# Patient Record
Sex: Male | Born: 1935 | Race: Black or African American | Hispanic: No | Marital: Married | Smoking: Former smoker
Health system: Southern US, Community
[De-identification: ages and names within clinical notes are randomized; demographics above are authoritative.]

## PROBLEM LIST (undated history)

## (undated) DIAGNOSIS — K2100 Gastro-esophageal reflux disease with esophagitis, without bleeding: Secondary | ICD-10-CM

## (undated) DIAGNOSIS — D120 Benign neoplasm of cecum: Secondary | ICD-10-CM

## (undated) DIAGNOSIS — D123 Benign neoplasm of transverse colon: Secondary | ICD-10-CM

## (undated) DIAGNOSIS — M25522 Pain in left elbow: Secondary | ICD-10-CM

## (undated) DIAGNOSIS — M533 Sacrococcygeal disorders, not elsewhere classified: Secondary | ICD-10-CM

## (undated) DIAGNOSIS — M48061 Spinal stenosis, lumbar region without neurogenic claudication: Secondary | ICD-10-CM

## (undated) DIAGNOSIS — E860 Dehydration: Secondary | ICD-10-CM

## (undated) DIAGNOSIS — N529 Male erectile dysfunction, unspecified: Secondary | ICD-10-CM

## (undated) DIAGNOSIS — I639 Cerebral infarction, unspecified: Secondary | ICD-10-CM

## (undated) DIAGNOSIS — E559 Vitamin D deficiency, unspecified: Secondary | ICD-10-CM

## (undated) DIAGNOSIS — I499 Cardiac arrhythmia, unspecified: Secondary | ICD-10-CM

## (undated) DIAGNOSIS — R64 Cachexia: Secondary | ICD-10-CM

## (undated) DIAGNOSIS — K2970 Gastritis, unspecified, without bleeding: Secondary | ICD-10-CM

## (undated) DIAGNOSIS — E785 Hyperlipidemia, unspecified: Secondary | ICD-10-CM

## (undated) DIAGNOSIS — I719 Aortic aneurysm of unspecified site, without rupture: Secondary | ICD-10-CM

## (undated) DIAGNOSIS — I351 Nonrheumatic aortic (valve) insufficiency: Secondary | ICD-10-CM

## (undated) DIAGNOSIS — M47816 Spondylosis without myelopathy or radiculopathy, lumbar region: Secondary | ICD-10-CM

## (undated) DIAGNOSIS — R531 Weakness: Secondary | ICD-10-CM

## (undated) DIAGNOSIS — I10 Essential (primary) hypertension: Secondary | ICD-10-CM

## (undated) DIAGNOSIS — I712 Thoracic aortic aneurysm, without rupture: Secondary | ICD-10-CM

## (undated) DIAGNOSIS — I4891 Unspecified atrial fibrillation: Secondary | ICD-10-CM

## (undated) DIAGNOSIS — I5022 Chronic systolic (congestive) heart failure: Secondary | ICD-10-CM

## (undated) DIAGNOSIS — R0609 Other forms of dyspnea: Secondary | ICD-10-CM

## (undated) DIAGNOSIS — L8990 Pressure ulcer of unspecified site, unspecified stage: Secondary | ICD-10-CM

## (undated) DIAGNOSIS — N289 Disorder of kidney and ureter, unspecified: Secondary | ICD-10-CM

## (undated) DIAGNOSIS — D649 Anemia, unspecified: Secondary | ICD-10-CM

## (undated) DIAGNOSIS — R29898 Other symptoms and signs involving the musculoskeletal system: Secondary | ICD-10-CM

## (undated) DIAGNOSIS — I69322 Dysarthria following cerebral infarction: Secondary | ICD-10-CM

## (undated) DIAGNOSIS — J449 Chronic obstructive pulmonary disease, unspecified: Secondary | ICD-10-CM

## (undated) DIAGNOSIS — J189 Pneumonia, unspecified organism: Secondary | ICD-10-CM

## (undated) DIAGNOSIS — I4819 Other persistent atrial fibrillation: Secondary | ICD-10-CM

## (undated) DIAGNOSIS — K921 Melena: Secondary | ICD-10-CM

## (undated) DIAGNOSIS — J431 Panlobular emphysema: Secondary | ICD-10-CM

## (undated) DIAGNOSIS — K621 Rectal polyp: Secondary | ICD-10-CM

## (undated) DIAGNOSIS — C3492 Malignant neoplasm of unspecified part of left bronchus or lung: Secondary | ICD-10-CM

## (undated) HISTORY — DX: Gastritis, unspecified, without bleeding: K29.70

## (undated) HISTORY — DX: Weakness: R53.1

## (undated) HISTORY — DX: Other persistent atrial fibrillation: I48.19

## (undated) HISTORY — DX: Spondylosis without myelopathy or radiculopathy, lumbar region: M47.816

## (undated) HISTORY — DX: Disorder of kidney and ureter, unspecified: N28.9

## (undated) HISTORY — DX: Panlobular emphysema: J43.1

## (undated) HISTORY — DX: Benign neoplasm of cecum: D12.0

## (undated) HISTORY — DX: Chronic systolic (congestive) heart failure: I50.22

## (undated) HISTORY — PX: FACIAL FRACTURE SURGERY: SHX1570

## (undated) HISTORY — DX: Essential (primary) hypertension: I10

## (undated) HISTORY — DX: Hyperlipidemia, unspecified: E78.5

## (undated) HISTORY — DX: Gastro-esophageal reflux disease with esophagitis: K21.0

## (undated) HISTORY — DX: Gastro-esophageal reflux disease with esophagitis, without bleeding: K21.00

## (undated) HISTORY — DX: Melena: K92.1

## (undated) HISTORY — DX: Malignant neoplasm of unspecified part of left bronchus or lung: C34.92

## (undated) HISTORY — DX: Male erectile dysfunction, unspecified: N52.9

## (undated) HISTORY — DX: Thoracic aortic aneurysm, without rupture: I71.2

## (undated) HISTORY — DX: Dehydration: E86.0

## (undated) HISTORY — PX: EYE SURGERY: SHX253

## (undated) HISTORY — DX: Other forms of dyspnea: R06.09

## (undated) HISTORY — DX: Anemia, unspecified: D64.9

## (undated) HISTORY — PX: OTHER SURGICAL HISTORY: SHX169

## (undated) HISTORY — DX: Spinal stenosis, lumbar region without neurogenic claudication: M48.061

## (undated) HISTORY — DX: Cerebral infarction, unspecified: I63.9

## (undated) HISTORY — DX: Dysarthria following cerebral infarction: I69.322

## (undated) HISTORY — DX: Aortic aneurysm of unspecified site, without rupture: I71.9

## (undated) HISTORY — DX: Cachexia: R64

## (undated) HISTORY — DX: Sacrococcygeal disorders, not elsewhere classified: M53.3

## (undated) HISTORY — DX: Other symptoms and signs involving the musculoskeletal system: R29.898

## (undated) HISTORY — PX: TONSILLECTOMY: SUR1361

## (undated) HISTORY — DX: Rectal polyp: K62.1

## (undated) HISTORY — PX: BACK SURGERY: SHX140

## (undated) HISTORY — DX: Pain in left elbow: M25.522

## (undated) HISTORY — DX: Nonrheumatic aortic (valve) insufficiency: I35.1

## (undated) HISTORY — DX: Pressure ulcer of unspecified site, unspecified stage: L89.90

## (undated) HISTORY — DX: Pneumonia, unspecified organism: J18.9

## (undated) HISTORY — DX: Benign neoplasm of transverse colon: D12.3

## (undated) HISTORY — DX: Vitamin D deficiency, unspecified: E55.9

---

## 2005-06-15 ENCOUNTER — Other Ambulatory Visit: Payer: Self-pay

## 2005-06-15 ENCOUNTER — Emergency Department: Payer: Self-pay | Admitting: Emergency Medicine

## 2005-06-19 ENCOUNTER — Encounter: Payer: Self-pay | Admitting: Internal Medicine

## 2005-06-21 ENCOUNTER — Ambulatory Visit: Payer: Self-pay | Admitting: Internal Medicine

## 2005-07-10 ENCOUNTER — Encounter: Payer: Self-pay | Admitting: Internal Medicine

## 2005-07-27 ENCOUNTER — Ambulatory Visit: Payer: Self-pay | Admitting: Internal Medicine

## 2005-08-10 ENCOUNTER — Encounter: Payer: Self-pay | Admitting: Internal Medicine

## 2005-09-14 ENCOUNTER — Ambulatory Visit: Payer: Self-pay | Admitting: Pain Medicine

## 2005-09-17 ENCOUNTER — Encounter: Payer: Self-pay | Admitting: Pain Medicine

## 2005-09-25 ENCOUNTER — Ambulatory Visit: Payer: Self-pay | Admitting: Pain Medicine

## 2005-10-10 ENCOUNTER — Encounter: Payer: Self-pay | Admitting: Pain Medicine

## 2005-11-19 ENCOUNTER — Ambulatory Visit: Payer: Self-pay | Admitting: Pain Medicine

## 2005-11-27 ENCOUNTER — Ambulatory Visit: Payer: Self-pay | Admitting: Pain Medicine

## 2005-11-29 ENCOUNTER — Ambulatory Visit: Payer: Self-pay | Admitting: Pain Medicine

## 2006-02-11 ENCOUNTER — Ambulatory Visit: Payer: Self-pay | Admitting: Chiropractic Medicine

## 2006-04-19 ENCOUNTER — Emergency Department: Payer: Self-pay | Admitting: Emergency Medicine

## 2010-01-03 ENCOUNTER — Ambulatory Visit: Payer: Self-pay | Admitting: Internal Medicine

## 2011-04-18 ENCOUNTER — Ambulatory Visit: Payer: Self-pay | Admitting: Pain Medicine

## 2011-04-26 ENCOUNTER — Other Ambulatory Visit: Payer: Self-pay | Admitting: Pain Medicine

## 2011-04-26 ENCOUNTER — Ambulatory Visit: Payer: Self-pay | Admitting: Pain Medicine

## 2011-05-28 ENCOUNTER — Ambulatory Visit: Payer: Self-pay | Admitting: Pain Medicine

## 2011-06-19 ENCOUNTER — Ambulatory Visit: Payer: Self-pay | Admitting: Pain Medicine

## 2011-07-19 ENCOUNTER — Ambulatory Visit: Payer: Self-pay | Admitting: Pain Medicine

## 2011-08-01 ENCOUNTER — Ambulatory Visit: Payer: Self-pay | Admitting: Pain Medicine

## 2014-02-18 ENCOUNTER — Ambulatory Visit: Payer: Self-pay | Admitting: Pain Medicine

## 2014-03-08 ENCOUNTER — Ambulatory Visit: Payer: Self-pay | Admitting: Pain Medicine

## 2014-05-27 ENCOUNTER — Ambulatory Visit: Payer: Self-pay | Admitting: Pain Medicine

## 2014-08-05 ENCOUNTER — Ambulatory Visit: Payer: Self-pay | Admitting: Pain Medicine

## 2014-09-01 ENCOUNTER — Ambulatory Visit: Payer: Self-pay | Admitting: Pain Medicine

## 2014-09-07 ENCOUNTER — Ambulatory Visit: Payer: Self-pay | Admitting: Pain Medicine

## 2015-04-04 ENCOUNTER — Ambulatory Visit
Admit: 2015-04-04 | Disposition: A | Discharge status: Home / Self Care | Payer: Self-pay | Attending: Ophthalmology | Admitting: Ophthalmology

## 2015-04-10 NOTE — Op Note (Addendum)
PATIENT NAME:  Patrick, Harding MR#:  [id number] DATE OF BIRTH:  1934-04-14  DATE OF PROCEDURE:  04/04/2015  PREOPERATIVE DIAGNOSIS:  Nuclear sclerotic cataract, left eye.    POSTOPERATIVE DIAGNOSIS:  Nuclear sclerotic cataract, left eye.  PROCEDURE PERFORMED:  Extracapsular cataract extraction using phacoemulsification with placement of an Alcon SN60WS, 20.5-diopter posterior chamber lens, serial number [id number].    SURGEON:  Loura Back. Teara Duerksen, MD  ASSISTANT:  None.  ANESTHESIA:  4% lidocaine and 0.75% Marcaine in a 50/50 mixture with 10 units per mL of Hylenex added, given as a peribulbar.  ANESTHESIOLOGIST:  Dr. Ronelle Nigh.    COMPLICATIONS:  None.  ESTIMATED BLOOD LOSS:  Less than 1 ml.  DESCRIPTION OF PROCEDURE:  The patient was brought to the operating room and given a peribulbar block.  The patient was then prepped and draped in the usual fashion.  The vertical rectus muscles were imbricated using 5-0 silk sutures.  These sutures were then clamped to the sterile drapes as bridle sutures.  A limbal peritomy was performed extending two clock hours and hemostasis was obtained with cautery.  A partial thickness scleral groove was made at the surgical limbus and dissected anteriorly in a lamellar dissection using an Alcon crescent knife.  The anterior chamber was entered supero-temporally with a Superblade and through the lamellar dissection with a 2.6 mm keratome.  DisCoVisc was used to replace the aqueous and a continuous tear capsulorrhexis was carried out.  Hydrodissection and hydrodelineation were carried out with balanced salt and a 27 gauge canula.  The nucleus was rotated to confirm the effectiveness of the hydrodissection.  Phacoemulsification was carried out using a divide-and-conquer technique.  Total ultrasound time was 1 minute and 35 seconds with an average power of 25.0 percent. CDE of 42.06.    Irrigation/aspiration was used to remove the residual cortex.  DisCoVisc was used  to inflate the capsule and the internal incision was enlarged to 3 mm with the crescent knife.  The intraocular lens was folded and inserted into the capsular bag using the AcrySert delivery system.  Irrigation/aspiration was used to remove the residual DisCoVisc.  Miostat was injected into the anterior chamber through the paracentesis track to inflate the anterior chamber and induce miosis.  0.1 mL of Vigamox containing 0.1 mg of drug was injected via the paracentesis track.  The wound was checked for leaks and none were found. The conjunctiva was closed with cautery and the bridle sutures were removed.  Two drops of 0.3% Vigamox were placed on the eye.   An eye shield was placed on the eye.  The patient was discharged to the recovery room in good condition.   ____________________________ Loura Back Sundae Maners, MD sad:bu D: 04/04/2015 13:39:51 ET T: 04/04/2015 16:56:48 ET JOB#: [id number]  cc: Remo Lipps A. Soleia Badolato, MD, <Dictator> Martie Lee MD ELECTRONICALLY SIGNED 04/09/2015 12:17

## 2015-04-13 ENCOUNTER — Encounter: Payer: Self-pay | Admitting: *Deleted

## 2015-04-14 ENCOUNTER — Encounter (INDEPENDENT_AMBULATORY_CARE_PROVIDER_SITE_OTHER): Payer: Self-pay

## 2015-04-14 ENCOUNTER — Encounter: Payer: Self-pay | Admitting: Cardiovascular Disease

## 2015-04-14 ENCOUNTER — Ambulatory Visit (INDEPENDENT_AMBULATORY_CARE_PROVIDER_SITE_OTHER): Payer: Medicare Other | Admitting: Cardiovascular Disease

## 2015-04-14 ENCOUNTER — Other Ambulatory Visit: Payer: Self-pay | Admitting: *Deleted

## 2015-04-14 ENCOUNTER — Ambulatory Visit (INDEPENDENT_AMBULATORY_CARE_PROVIDER_SITE_OTHER): Payer: Medicare Other

## 2015-04-14 VITALS — BP 124/62 | HR 97 | Ht >= 80 in | Wt 152.8 lb

## 2015-04-14 DIAGNOSIS — I10 Essential (primary) hypertension: Secondary | ICD-10-CM | POA: Insufficient documentation

## 2015-04-14 DIAGNOSIS — I4819 Other persistent atrial fibrillation: Secondary | ICD-10-CM | POA: Insufficient documentation

## 2015-04-14 DIAGNOSIS — I499 Cardiac arrhythmia, unspecified: Secondary | ICD-10-CM

## 2015-04-14 DIAGNOSIS — I4891 Unspecified atrial fibrillation: Secondary | ICD-10-CM

## 2015-04-14 HISTORY — DX: Essential (primary) hypertension: I10

## 2015-04-14 HISTORY — DX: Other persistent atrial fibrillation: I48.19

## 2015-04-14 NOTE — Assessment & Plan Note (Signed)
The patient seems to be in atrial fibrillation. There is motion artifact and some P waves might be visible on the tracing. Thus, multifocal atrial rhythm cannot be completely excluded. By physical exam, his heart is irregularly irregular with no murmurs. I requested a 24-hour Holter monitor to evaluate his ventricular rate control. I also requested an echocardiogram. He might require rate control medications. I discussed with him anticoagulation and he wants to think about this until next visit.

## 2015-04-14 NOTE — Patient Instructions (Signed)
Medication Instructions:  No changes  Labwork: None  Testing/Procedures: Your physician has requested that you have an echocardiogram. Echocardiography is a painless test that uses sound waves to create images of your heart. It provides your doctor with information about the size and shape of your heart and how well your heart's chambers and valves are working. This procedure takes approximately one hour. There are no restrictions for this procedure.  Your physician has recommended that you wear a holter monitor. Holter monitors are medical devices that record the heart's electrical activity. Doctors most often use these monitors to diagnose arrhythmias. Arrhythmias are problems with the speed or rhythm of the heartbeat. The monitor is a small, portable device. You can wear one while you do your normal daily activities. This is usually used to diagnose what is causing palpitations/syncope (passing out).    Follow-Up: Your physician recommends that you schedule a follow-up appointment with Dr. Fletcher Anon after echo and holter monitor.   Any Other Special Instructions Will Be Listed Below (If Applicable).

## 2015-04-14 NOTE — Progress Notes (Signed)
Primary care physician:Dr. Rosario Jacks  HPI  This is a 81 year old African-American male who was referred for evaluation of irregular heartbeats. He has known history of hypertension, hyperlipidemia, tobacco use, iron deficiency anemia and chronic kidney disease. No previous cardiac history. He was noted recently to have irregular heartbeats and thus he was referred for evaluation. He has no previous history of atrial fibrillation. No history of stroke or congestive heart failure. He denies any chest pain or shortness of breath. He is not aware of palpitations. Recent labs showed an hemoglobin of 13.3, platelet count of 261, creatinine of 1.15, BUN of 15.  Allergies  Allergen Reactions  . Penicillins      Current Outpatient Prescriptions on File Prior to Visit  Medication Sig Dispense Refill  . losartan (COZAAR) 100 MG tablet Take 100 mg by mouth daily.    Marland Kitchen lovastatin (MEVACOR) 20 MG tablet Take 20 mg by mouth at bedtime.     No current facility-administered medications on file prior to visit.     Past Medical History  Diagnosis Date  . Hyperlipidemia   . Anemia   . Vitamin D deficiency   . Hypertension   . Renal insufficiency   . Erectile dysfunction      Past Surgical History  Procedure Laterality Date  . Head surgery Left      Family History  Problem Relation Age of Onset  . Family history unknown: Yes     History   Social History  . Marital Status: Married    Spouse Name: N/A  . Number of Children: N/A  . Years of Education: N/A   Occupational History  . Not on file.   Social History Main Topics  . Smoking status: Current Every Day Smoker -- 0.50 packs/day for 20 years    Types: Cigarettes  . Smokeless tobacco: Not on file  . Alcohol Use: Yes     Comment: occasional  . Drug Use: No  . Sexual Activity: Not on file   Other Topics Concern  . Not on file   Social History Narrative     ROS A 10 point review of system was performed. It is negative  other than that mentioned in the history of present illness.   PHYSICAL EXAM   BP 124/62 mmHg  Pulse 97  Ht '6\' 8"'$  (2.032 m)  Wt 152 lb 12 oz (69.287 kg)  BMI 16.78 kg/m2 Constitutional: He is oriented to person, place, and time. He appears well-developed and well-nourished. No distress.  HENT: No nasal discharge.  Head: Normocephalic and atraumatic.  Eyes: Pupils are equal and round.  No discharge. Neck: Normal range of motion. Neck supple. No JVD present. No thyromegaly present.  Cardiovascular: Normal rate, irregular rhythm, normal heart sounds. Exam reveals no gallop and no friction rub. No murmur heard.  Pulmonary/Chest: Effort normal and breath sounds normal. No stridor. No respiratory distress. He has no wheezes. He has no rales. He exhibits no tenderness.  Abdominal: Soft. Bowel sounds are normal. He exhibits no distension. There is no tenderness. There is no rebound and no guarding.  Musculoskeletal: Normal range of motion. He exhibits no edema and no tenderness.  Neurological: He is alert and oriented to person, place, and time. Coordination normal.  Skin: Skin is warm and dry. No rash noted. He is not diaphoretic. No erythema. No pallor.  Psychiatric: He has a normal mood and affect. His behavior is normal. Judgment and thought content normal.       QVZ:DGLOVF  fibrillation  - occasional ectopic ventricular beat    -Old anteroseptal infarct.   -Anterolateral ST-elevation -repolarization variant.   ABNORMAL     ASSESSMENT AND PLAN

## 2015-04-14 NOTE — Assessment & Plan Note (Signed)
Blood pressure is reasonably controlled on current medications. 

## 2015-04-18 ENCOUNTER — Ambulatory Visit (INDEPENDENT_AMBULATORY_CARE_PROVIDER_SITE_OTHER): Payer: Medicare Other

## 2015-04-18 ENCOUNTER — Other Ambulatory Visit: Payer: Self-pay

## 2015-04-18 DIAGNOSIS — I4891 Unspecified atrial fibrillation: Secondary | ICD-10-CM

## 2015-05-06 ENCOUNTER — Ambulatory Visit (INDEPENDENT_AMBULATORY_CARE_PROVIDER_SITE_OTHER): Payer: Medicare Other | Admitting: Cardiovascular Disease

## 2015-05-06 ENCOUNTER — Encounter: Payer: Self-pay | Admitting: Cardiovascular Disease

## 2015-05-06 ENCOUNTER — Telehealth: Payer: Self-pay | Admitting: *Deleted

## 2015-05-06 VITALS — BP 102/50 | HR 102 | Ht 72.0 in | Wt 153.0 lb

## 2015-05-06 DIAGNOSIS — I4891 Unspecified atrial fibrillation: Secondary | ICD-10-CM | POA: Diagnosis not present

## 2015-05-06 DIAGNOSIS — I10 Essential (primary) hypertension: Secondary | ICD-10-CM | POA: Diagnosis not present

## 2015-05-06 MED ORDER — METOPROLOL TARTRATE 25 MG PO TABS: 25.0000 mg | ORAL_TABLET | Freq: Two times a day (BID) | ORAL | Status: DC

## 2015-05-06 MED ORDER — LOSARTAN POTASSIUM 25 MG PO TABS: 25.0000 mg | ORAL_TABLET | Freq: Every day | ORAL | Status: DC

## 2015-05-06 MED ORDER — APIXABAN 5 MG PO TABS: 5.0000 mg | ORAL_TABLET | Freq: Two times a day (BID) | ORAL | Status: DC

## 2015-05-06 NOTE — Assessment & Plan Note (Signed)
The patient seems to have chronic atrial fibrillation with very minimal symptoms related to this. Holter monitor showed that his average heart rate was 120 bpm. Thus, he needs better rate control. I added metoprolol 25 mg twice daily. CHADS VASc score is 3 and thus I recommend long-term anticoagulation. I discussed this extensively with him and presented him with different options. We decided to start treatment with Eliquis 5 mg twice daily. I explained to him the risks and benefits.

## 2015-05-06 NOTE — Telephone Encounter (Signed)
Spoke w/ pt. Advised him that I am leaving samples at the front desk, but we close in 5 minutes.  Advised him to contact his pharmacy and see if he get enough to last him through the weekend; Monday is Memorial Day & we are closed. He verbalizes understanding and is appreciative.

## 2015-05-06 NOTE — Progress Notes (Signed)
Primary care physician:Dr. Rosario Jacks  HPI  This is a 81 year old African-American male who is here today for a follow-up visit regarding atrial fibrillation . He has known history of hypertension, hyperlipidemia, tobacco use, iron deficiency anemia and chronic kidney disease. No previous cardiac history. He was noted recently to have irregular heartbeats and EKG showed atrial fibrillation. No history of stroke or congestive heart failure. He denies any chest pain or shortness of breath. He is not aware of palpitations. Recent labs showed an hemoglobin of 13.3, platelet count of 261, creatinine of 1.15, BUN of 15. Holter monitor was done which showed that he was mostly in atrial fibrillation with a mean heart rate of 120 bpm. Echocardiogram showed an ejection fraction of 50-55%, mild aortic and mitral regurgitation and normal atrial size.  Allergies  Allergen Reactions  . Penicillins      Current Outpatient Prescriptions on File Prior to Visit  Medication Sig Dispense Refill  . aspirin 81 MG tablet Take 81 mg by mouth as needed.     Marland Kitchen losartan (COZAAR) 100 MG tablet Take 100 mg by mouth daily.    Marland Kitchen lovastatin (MEVACOR) 20 MG tablet Take 20 mg by mouth at bedtime.     No current facility-administered medications on file prior to visit.     Past Medical History  Diagnosis Date  . Hyperlipidemia   . Anemia   . Vitamin D deficiency   . Hypertension   . Renal insufficiency   . Erectile dysfunction      Past Surgical History  Procedure Laterality Date  . Head surgery Left      Family History  Problem Relation Age of Onset  . Family history unknown: Yes     History   Social History  . Marital Status: Married    Spouse Name: N/A  . Number of Children: N/A  . Years of Education: N/A   Occupational History  . Not on file.   Social History Main Topics  . Smoking status: Current Every Day Smoker -- 0.50 packs/day for 20 years    Types: Cigarettes  . Smokeless tobacco:  Not on file  . Alcohol Use: Yes     Comment: occasional  . Drug Use: No  . Sexual Activity: Not on file   Other Topics Concern  . Not on file   Social History Narrative     ROS A 10 point review of system was performed. It is negative other than that mentioned in the history of present illness.   PHYSICAL EXAM   BP 102/50 mmHg  Pulse 102  Ht 6' (1.829 m)  Wt 153 lb (69.4 kg)  BMI 20.75 kg/m2 Constitutional: He is oriented to person, place, and time. He appears well-developed and well-nourished. No distress.  HENT: No nasal discharge.  Head: Normocephalic and atraumatic.  Eyes: Pupils are equal and round.  No discharge. Neck: Normal range of motion. Neck supple. No JVD present. No thyromegaly present.  Cardiovascular: Mildly tachycardic, irregular rhythm, normal heart sounds. Exam reveals no gallop and no friction rub. No murmur heard.  Pulmonary/Chest: Effort normal and breath sounds normal. No stridor. No respiratory distress. He has no wheezes. He has no rales. He exhibits no tenderness.  Abdominal: Soft. Bowel sounds are normal. He exhibits no distension. There is no tenderness. There is no rebound and no guarding.  Musculoskeletal: Normal range of motion. He exhibits no edema and no tenderness.  Neurological: He is alert and oriented to person, place, and time. Coordination  normal.  Skin: Skin is warm and dry. No rash noted. He is not diaphoretic. No erythema. No pallor.  Psychiatric: He has a normal mood and affect. His behavior is normal. Judgment and thought content normal.         ASSESSMENT AND PLAN

## 2015-05-06 NOTE — Telephone Encounter (Signed)
Pt calling stating for samples of Eliquis  Pt went to Pharmacy and they only gave him 2 pills for they said his insurance company did not cover it.  Please call when ready.

## 2015-05-06 NOTE — Assessment & Plan Note (Signed)
Blood pressure is somewhat on the low side. Given that and starting metoprolol, I elected to decrease losartan to 25 mg once daily.

## 2015-05-06 NOTE — Patient Instructions (Signed)
Medication Instructions:  Your physician has recommended you make the following change in your medication:  STOP taking aspirin DECREASE losartan to '25mg'$  ONCE A DAY START taking metoprolol '25mg'$  twice a day START taking eliquis '5mg'$  twice a day   Labwork: None  Testing/Procedures: None  Follow-Up: Your physician recommends that you schedule a follow-up appointment in: three months with Dr. Fletcher Anon.    Any Other Special Instructions Will Be Listed Below (If Applicable).

## 2015-05-10 ENCOUNTER — Telehealth: Payer: Self-pay | Admitting: Cardiovascular Disease

## 2015-05-10 NOTE — Telephone Encounter (Signed)
Patient insurance denied Eliquis per patient please send prior auth form to new insurance company [employer]

## 2015-05-12 ENCOUNTER — Other Ambulatory Visit: Payer: Self-pay | Admitting: *Deleted

## 2015-05-12 MED ORDER — APIXABAN 5 MG PO TABS: 5.0000 mg | ORAL_TABLET | Freq: Two times a day (BID) | ORAL | Status: DC

## 2015-05-12 NOTE — Telephone Encounter (Signed)
Attempted to call pharamacy x2 no answer option to leave message lmovm to contact office regarding pt refill on Eliquis.

## 2015-05-13 NOTE — Telephone Encounter (Signed)
Spoke with wife Patrick Harding) to verify that patient does have samples available of Eliquis. Mrs. Patrick Harding does have enough samples of Eliquis for one month. Told the patient we are waiting on the approval from insurance company.

## 2015-05-13 NOTE — Telephone Encounter (Signed)
S/w patient who indicated he might be having some teeth pulled soon but does not know when. He wants to know if he should take his Eliquis now. Advised patient to start taking it now and have dental office contact us with details of surgery. Pt verbalized understanding and stated he would start Eliquis samples tonight while awaiting insurance approval.

## 2015-05-13 NOTE — Telephone Encounter (Signed)
Pt is asking if we can call him He is not sure what medication to take (in samples)  He states he will not take anything until we call him.  He would like [place] to talk to him.  Please advise.

## 2015-06-06 ENCOUNTER — Telehealth: Payer: Self-pay

## 2015-06-06 NOTE — Telephone Encounter (Signed)
Dental procedures are generally considered to confer a low risk of bleeding, and anticoagulation can be continued in most patients during these procedures. The evidence for the safety of continuing anticoagulation comes from patients receiving warfarin with an INR in the therapeutic range. In the ARISTOTLE trial, which included patients anticoagulated with warfarin versus apixaban for atrial fibrillation, perioperative bleeding rates were approximately 1% in patients undergoing dental and other low bleeding risk procedures. Bleeding can be further reduced with the use of topical hemostatic agents (eg, tranexamic acid or aminocaproic acid mouthwash, used four times daily for at least two days). An exception is multiple tooth extractions, which we consider high bleeding risk.  Thus, if he is having more than 1 tooth extracted he would need to hold apixaban for 2 days prior and restart 24 hours post procedure if hemostasis has been obtained. If he is having only one tooth extracted would recommend based on evidence based guidelines that he continue to reduce the risk of stroke given his CHADSVASc of 3.

## 2015-06-06 NOTE — Telephone Encounter (Signed)
Pt would like to know if he needs to be off his blood thinner to have his teeth pulled. Please call.

## 2015-06-07 NOTE — Telephone Encounter (Signed)
Spoke w/ pt.  Advised him of Ryan's recommendation.  He verbalizes understanding but reports that he is schedule for an epidural next week on 06/15/15 and would like instructions for this, as well.  Advised him that we have not received paperwork for this yet.  He asks for a call back today, as he is unfamiliar w/ the process for this.

## 2015-06-07 NOTE — Telephone Encounter (Signed)
Patient will need to hold anticoagulation for epidural x 3 days prior. He should restart as soon as possible when MD says it is safe.     Given the above requirement to hold apixaban for epidural and the tooth extraction as well it would be best to not interrupt anticoagulation x 2. Please see prior note regarding tooth extraction as he must hold anticoagulaiton for epidural.

## 2015-06-07 NOTE — Telephone Encounter (Signed)
Spoke w/ pt.  Advised him of Ryan's recommendation.  He verbalizes understanding, though the does not know if he will be able to coordinate these appts w/in that narrow time frame, but will try.  He will call back w/ any further questions or concerns.

## 2015-06-20 ENCOUNTER — Telehealth: Payer: Self-pay | Admitting: Cardiovascular Disease

## 2015-06-20 NOTE — Telephone Encounter (Signed)
Patient dental procedure was changed from Monday today until Wednesday.  Should patient still hold coumadin?  Please call patient

## 2015-06-23 NOTE — Telephone Encounter (Signed)
S/w patient who states he was scheduled for tooth extractions yesterday and dental office cancelled and referred him to oral surgeon. He is unsure when he will have extractions but indicated it would be more than one. He states he has not taken his Eliquis in 4 days Advised patient to restart Eliquis today and continue twice a day until 2 days before procedure as he is having more than one tooth pulled. States he has not yet had epidural as he is awaiting insurance approval. Reviewed Ryan Dunn's recommendations, pt repeated back to me, verbalized understanding with no further questions.

## 2015-06-23 NOTE — Telephone Encounter (Signed)
Pt is calling asking he understands the coumadin part now he is questioning his Eliquis, he does not see Dr Fletcher Anon for another 2w wants to know if he should stop this and restart it as well when he comes to see [place] Please advise .

## 2015-06-29 ENCOUNTER — Encounter: Payer: Medicare PPO | Admitting: General Surgery

## 2015-07-05 NOTE — Progress Notes (Signed)
This encounter was created in error - please disregard.

## 2015-08-04 ENCOUNTER — Encounter: Payer: Self-pay | Admitting: Cardiovascular Disease

## 2015-08-04 ENCOUNTER — Ambulatory Visit (INDEPENDENT_AMBULATORY_CARE_PROVIDER_SITE_OTHER): Payer: Medicare Other | Admitting: Cardiovascular Disease

## 2015-08-04 VITALS — BP 143/81 | HR 101 | Ht 72.0 in | Wt 148.8 lb

## 2015-08-04 DIAGNOSIS — I10 Essential (primary) hypertension: Secondary | ICD-10-CM | POA: Diagnosis not present

## 2015-08-04 DIAGNOSIS — I4891 Unspecified atrial fibrillation: Secondary | ICD-10-CM

## 2015-08-04 NOTE — Assessment & Plan Note (Signed)
Blood pressure is reasonably controlled. 

## 2015-08-04 NOTE — Assessment & Plan Note (Signed)
He has been taking metoprolol once daily instead of twice daily and also the same for Eliquis.  I advised him that these are twice daily medications and should be taken as instructed. I strongly advised him to find a solution for his bad teeth. He reports mostly financial reasons for not having them fixed. He is going to require dental extraction. I recommended that he holds Eliquis 2 days before and one day after.

## 2015-08-04 NOTE — Progress Notes (Signed)
Primary care physician:Dr. Rosario Jacks  HPI  This is a 81 year old African-American male who is here today for a follow-up visit regarding atrial fibrillation . He has known history of hypertension, hyperlipidemia, tobacco use, iron deficiency anemia and chronic kidney disease. No previous cardiac history. He was noted recently to have irregular heartbeats and EKG showed atrial fibrillation. No history of stroke or congestive heart failure. He denies any chest pain or shortness of breath. He is not aware of palpitations. Recent labs showed an hemoglobin of 13.3, platelet count of 261, creatinine of 1.15, BUN of 15. Holter monitor was done which showed that he was mostly in atrial fibrillation with a mean heart rate of 120 bpm. Echocardiogram showed an ejection fraction of 50-55%, mild aortic and mitral regurgitation and normal atrial size. I started him on metoprolol 25 mg twice daily and initiate anticoagulation with Eliquis 5 mg twice daily. He seems to be stable from a cardiac standpoint. However, he has extremely poor oral hygiene and needs to have dental extraction followed by dentures. He has not been eating any solids and mostly relies on ensure. He has lost about 20 pounds over the last few months.  Allergies  Allergen Reactions  . Penicillins      Current Outpatient Prescriptions on File Prior to Visit  Medication Sig Dispense Refill  . apixaban (ELIQUIS) 5 MG TABS tablet Take 1 tablet (5 mg total) by mouth 2 (two) times daily. (Patient taking differently: Take 10 mg by mouth daily. ) 60 tablet 5  . losartan (COZAAR) 25 MG tablet Take 1 tablet (25 mg total) by mouth daily. 30 tablet 5  . lovastatin (MEVACOR) 20 MG tablet Take 20 mg by mouth at bedtime.    . metoprolol tartrate (LOPRESSOR) 25 MG tablet Take 1 tablet (25 mg total) by mouth 2 (two) times daily. (Patient taking differently: Take 50 mg by mouth daily. ) 60 tablet 5   No current facility-administered medications on file prior  to visit.     Past Medical History  Diagnosis Date  . Hyperlipidemia   . Anemia   . Vitamin D deficiency   . Hypertension   . Renal insufficiency   . Erectile dysfunction      Past Surgical History  Procedure Laterality Date  . Head surgery Left      Family History  Problem Relation Age of Onset  . Family history unknown: Yes     Social History   Social History  . Marital Status: Married    Spouse Name: N/A  . Number of Children: N/A  . Years of Education: N/A   Occupational History  . Not on file.   Social History Main Topics  . Smoking status: Current Every Day Smoker -- 0.50 packs/day for 20 years    Types: Cigarettes  . Smokeless tobacco: Not on file  . Alcohol Use: No     Comment: occasional  . Drug Use: No  . Sexual Activity: Not on file   Other Topics Concern  . Not on file   Social History Narrative     ROS A 10 point review of system was performed. It is negative other than that mentioned in the history of present illness.   PHYSICAL EXAM   BP 143/81 mmHg  Pulse 101  Ht 6' (1.829 m)  Wt 148 lb 12 oz (67.473 kg)  BMI 20.17 kg/m2 Constitutional: He is oriented to person, place, and time. He appears well-developed and well-nourished. No distress.  HENT: No  nasal discharge.  Head: Normocephalic and atraumatic.  Eyes: Pupils are equal and round.  No discharge. Neck: Normal range of motion. Neck supple. No JVD present. No thyromegaly present.  Cardiovascular: Mildly tachycardic, irregular rhythm, normal heart sounds. Exam reveals no gallop and no friction rub. No murmur heard.  Pulmonary/Chest: Effort normal and breath sounds normal. No stridor. No respiratory distress. He has no wheezes. He has no rales. He exhibits no tenderness.  Abdominal: Soft. Bowel sounds are normal. He exhibits no distension. There is no tenderness. There is no rebound and no guarding.  Musculoskeletal: Normal range of motion. He exhibits no edema and no tenderness.   Neurological: He is alert and oriented to person, place, and time. Coordination normal.  Skin: Skin is warm and dry. No rash noted. He is not diaphoretic. No erythema. No pallor.  Psychiatric: He has a normal mood and affect. His behavior is normal. Judgment and thought content normal.     WTK:TCCEQF fibrillation  -Old anteroseptal infarct.   -  Nonspecific T-abnormality.   ABNORMAL      ASSESSMENT AND PLAN

## 2015-08-04 NOTE — Patient Instructions (Signed)
Medication Instructions: Take Metoprolol 25 mg twice daily not once daily.  Take Eliquis 5 mg twice daily not once daily.   Labwork: None.   Procedures/Testing: None.   Follow-Up: 4 months with Dr. Fletcher Anon  Any Additional Special Instructions Will Be Listed Below (If Applicable). Hold Eliquis 2 days before procedures/dental extraction and 1 day after then resume.

## 2015-08-10 ENCOUNTER — Encounter: Payer: Self-pay | Admitting: Pain Medicine

## 2015-08-10 ENCOUNTER — Ambulatory Visit: Payer: Medicare Other | Attending: Pain Medicine | Admitting: Pain Medicine

## 2015-08-10 VITALS — BP 129/69 | HR 75 | Temp 97.5°F | Resp 14 | Ht 72.0 in | Wt 151.0 lb

## 2015-08-10 DIAGNOSIS — M79604 Pain in right leg: Secondary | ICD-10-CM | POA: Diagnosis present

## 2015-08-10 DIAGNOSIS — M5136 Other intervertebral disc degeneration, lumbar region: Secondary | ICD-10-CM | POA: Insufficient documentation

## 2015-08-10 DIAGNOSIS — M48062 Spinal stenosis, lumbar region with neurogenic claudication: Secondary | ICD-10-CM | POA: Insufficient documentation

## 2015-08-10 DIAGNOSIS — F17200 Nicotine dependence, unspecified, uncomplicated: Secondary | ICD-10-CM | POA: Diagnosis not present

## 2015-08-10 DIAGNOSIS — M4806 Spinal stenosis, lumbar region: Secondary | ICD-10-CM | POA: Insufficient documentation

## 2015-08-10 DIAGNOSIS — M47816 Spondylosis without myelopathy or radiculopathy, lumbar region: Secondary | ICD-10-CM

## 2015-08-10 DIAGNOSIS — M533 Sacrococcygeal disorders, not elsewhere classified: Secondary | ICD-10-CM | POA: Insufficient documentation

## 2015-08-10 DIAGNOSIS — I10 Essential (primary) hypertension: Secondary | ICD-10-CM | POA: Diagnosis not present

## 2015-08-10 DIAGNOSIS — M5416 Radiculopathy, lumbar region: Secondary | ICD-10-CM | POA: Insufficient documentation

## 2015-08-10 DIAGNOSIS — Z9889 Other specified postprocedural states: Secondary | ICD-10-CM | POA: Diagnosis not present

## 2015-08-10 DIAGNOSIS — M79605 Pain in left leg: Secondary | ICD-10-CM | POA: Diagnosis present

## 2015-08-10 DIAGNOSIS — I499 Cardiac arrhythmia, unspecified: Secondary | ICD-10-CM | POA: Insufficient documentation

## 2015-08-10 DIAGNOSIS — Z7901 Long term (current) use of anticoagulants: Secondary | ICD-10-CM

## 2015-08-10 HISTORY — DX: Sacrococcygeal disorders, not elsewhere classified: M53.3

## 2015-08-10 HISTORY — DX: Spondylosis without myelopathy or radiculopathy, lumbar region: M47.816

## 2015-08-10 NOTE — Progress Notes (Signed)
Safety precautions to be maintained throughout the outpatient stay will include: orient to surroundings, keep bed in low position, maintain call bell within reach at all times, provide assistance with transfer out of bed and ambulation.  

## 2015-08-10 NOTE — Progress Notes (Signed)
Subjective:    Patient ID: Patrick Harding, male    DOB: [birth year], 81 y.o.   MRN: [id number]  HPI The patient is a 81 year old gentleman who [facility] for further evaluation and treatment of lower back and lower extremity pain. Patient is with history of lower back and lower extremity pain. Patient's pain began when aluminum pipes fell on patient while on the job several years ago. Patient is undergone prior interventional treatment of the lumbar region and is with significant pain involving the lumbar lower extremity regions contributing to severely disabling pain involving the right lower extremity region more than the left lower extremity. The patient described his pain as aching-type sensation the pain is aggravated by walking bending. The pain is decreased with prior interventional treatment and medications were discussed patient's condition on today's visit and will consider patient interventional treatment pending follow-up evaluation. As understanding and in agreement status treatment plan. The patient is without desire to proceed with surgical evaluation and surgical intervention at this time.     Review of Systems   Cardiovascular Hypertension Abnormal heart rhythm  Pulmonary Positive cigarette smoking  Neurological Unremarkable  Psychological Unremarkable  Gastrointestinal Unremarkable  Genitourinary Unremarkable  Hematological Unremarkable  Endocrine Unremarkable  Rheumatological Unremarkable  Musculoskeletal Unremarkable  Other significant Weight loss   Objective:   Physical Exam  There was tenderness to palpation of the splenius capitis and occipitalis muscular region of mild degree. There was mild tenderness of the cervical facet cervical paraspinal musculature region. Patient appeared to be with bilaterally equal grip strength. Tinel and Phalen's maneuver were without increase of pain of significant degree. There was tenderness to  palpation over the thoracic facet thoracic paraspinal muscular region a mild to moderate degree. There was moderate muscle spasms of the lower thoracic paraspinal musculature region. No crepitus of the thoracic region was noted. Tinel and Phalen's maneuver were without increase of pain of significant degree. Palpation over the lumbar paraspinal muscular region lumbar facet region was with moderate discomfort. Lateral bending rotation and extension and palpation of the lumbar facets reproduce moderately severe discomfort. Straight leg raising was decreased on the right compared to the left. Straight leg raising was tolerates approximately 20 without a definite increase of pain with dorsiflexion noted. There was well-healed surgical scar the lumbar region without increased warmth or erythema in the region of the scar. No definite sensory deficit of dermatomal distribution of the lower extremities were noted. There was decreased EHL strength. There was negative clonus negative Homans. Knees were with tinged palpation with crepitus of the knees with no increased warmth or erythema of the knees noted there was anterior and posterior drawer signs found to be negative. Mild tinnitus of the greater trochanteric region and iliotibial band region. Abdomen soft nontender with no costovertebral angle tenderness noted.      Assessment & Plan:    Degenerative disc disease lumbar spine Post lumbar surgery Lumbar stenosis with neurogenic claudication Lumbar facet syndrome Lumbar radiculopathy  Sacroiliac joint dysfunction   PLAN   Continue present medications  Lumbar epidural steroid injection to be performed at time of return appointment  F/U PCP Dr.Jadali  for evaliation of  BP and general medical  condition  F/U surgical evaluation. May consider pending follow-up evaluations  F/U neurological evaluation. May consider pending follow-up evaluations  May consider radiofrequency rhizolysis or  intraspinal procedures pending response to present treatment and F/U evaluation   [facility] to call [facility] should patient have concerns prior  to scheduled return appointment.

## 2015-08-10 NOTE — Progress Notes (Signed)
fAXED  REQUEST TO dR. aRIDA  FOR LENGTH OF PT TO STOP  ELIQUIS PT INFORMED TO GO TO LABS MORNING OF PROCEDURE OR DAY OF PROCEDURE.

## 2015-08-10 NOTE — Progress Notes (Signed)
   Subjective:    Patient ID: Patrick Harding, male    DOB: March 24, 1934, 81 y.o.   MRN: [id number]  HPI    Review of Systems     Objective:   Physical Exam        Assessment & Plan:

## 2015-08-10 NOTE — Patient Instructions (Addendum)
PLAN   Continue present medications  Lumbar epidural steroid injection to be performed at time return appointment  F/U PCP Dr Rosario Jacks for evaliation of  BP and general medical  condition  F/U surgical evaluation  F/U neurological evaluation  May consider radiofrequency rhizolysis or intraspinal procedures pending response to present treatment and F/U evaluation   Patient to call [facility] should patient have concerns prior to scheduled return appointment. GENERAL RISKS AND COMPLICATIONS  What are the risk, side effects and possible complications? Generally speaking, most procedures are safe.  However, with any procedure there are risks, side effects, and the possibility of complications.  The risks and complications are dependent upon the sites that are lesioned, or the type of nerve block to be performed.  The closer the procedure is to the spine, the more serious the risks are.  Great care is taken when placing the radio frequency needles, block needles or lesioning probes, but sometimes complications can occur. 1. Infection: Any time there is an injection through the skin, there is a risk of infection.  This is why sterile conditions are used for these blocks.  There are four possible types of infection. 1. Localized skin infection. 2. Central Nervous System Infection-This can be in the form of Meningitis, which can be deadly. 3. Epidural Infections-This can be in the form of an epidural abscess, which can cause pressure inside of the spine, causing compression of the spinal cord with subsequent paralysis. This would require an emergency surgery to decompress, and there are no guarantees that the patient would recover from the paralysis. 4. Discitis-This is an infection of the intervertebral discs.  It occurs in about 1% of discography procedures.  It is difficult to treat and it may lead to surgery.        2. Pain: the needles have to go through skin and soft tissues, will  cause soreness.       3. Damage to internal structures:  The nerves to be lesioned may be near blood vessels or    other nerves which can be potentially damaged.       4. Bleeding: Bleeding is more common if the patient is taking blood thinners such as  aspirin, Coumadin, Ticiid, Plavix, etc., or if he/she have some genetic predisposition  such as hemophilia. Bleeding into the spinal canal can cause compression of the spinal  cord with subsequent paralysis.  This would require an emergency surgery to  decompress and there are no guarantees that the patient would recover from the  paralysis.       5. Pneumothorax:  Puncturing of a lung is a possibility, every time a needle is introduced in  the area of the chest or upper back.  Pneumothorax refers to free air around the  collapsed lung(s), inside of the thoracic cavity (chest cavity).  Another two possible  complications related to a similar event would include: Hemothorax and Chylothorax.   These are variations of the Pneumothorax, where instead of air around the collapsed  lung(s), you may have blood or chyle, respectively.       6. Spinal headaches: They may occur with any procedures in the area of the spine.       7. Persistent CSF (Cerebro-Spinal Fluid) leakage: This is a rare problem, but may occur  with prolonged intrathecal or epidural catheters either due to the formation of a fistulous  track or a dural tear.       8. Nerve damage: By working  so close to the spinal cord, there is always a possibility of  nerve damage, which could be as serious as a permanent spinal cord injury with  paralysis.       9. Death:  Although rare, severe deadly allergic reactions known as "Anaphylactic  reaction" can occur to any of the medications used.      10. Worsening of the symptoms:  We can always make thing worse.  What are the chances of something like this happening? Chances of any of this occuring are extremely low.  By statistics, you have more of a chance  of getting killed in a motor vehicle accident: while driving to the hospital than any of the above occurring .  Nevertheless, you should be aware that they are possibilities.  In general, it is similar to taking a shower.  Everybody knows that you can slip, hit your head and get killed.  Does that mean that you should not shower again?  Nevertheless always keep in mind that statistics do not mean anything if you happen to be on the wrong side of them.  Even if a procedure has a 1 (one) in a 1,000,000 (million) chance of going wrong, it you happen to be that one..Also, keep in mind that by statistics, you have more of a chance of having something go wrong when taking medications.  Who should not have this procedure? If you are on a blood thinning medication (e.g. Coumadin, Plavix, see list of "Blood Thinners"), or if you have an active infection going on, you should not have the procedure.  If you are taking any blood thinners, please inform your physician.  How should I prepare for this procedure?  Do not eat or drink anything at least six hours prior to the procedure.  Bring a driver with you .  It cannot be a taxi.  Come accompanied by an adult that can drive you back, and that is strong enough to help you if your legs get weak or numb from the local anesthetic.  Take all of your medicines the morning of the procedure with just enough water to swallow them.  If you have diabetes, make sure that you are scheduled to have your procedure done first thing in the morning, whenever possible.  If you have diabetes, take only half of your insulin dose and notify our nurse that you have done so as soon as you arrive at the clinic.  If you are diabetic, but only take blood sugar pills (oral hypoglycemic), then do not take them on the morning of your procedure.  You may take them after you have had the procedure.  Do not take aspirin or any aspirin-containing medications, at least eleven (11) days prior  to the procedure.  They may prolong bleeding.  Wear loose fitting clothing that may be easy to take off and that you would not mind if it got stained with Betadine or blood.  Do not wear any jewelry or perfume  Remove any nail coloring.  It will interfere with some of our monitoring equipment.  NOTE: Remember that this is not meant to be interpreted as a complete list of all possible complications.  Unforeseen problems may occur.  BLOOD THINNERS The following drugs contain aspirin or other products, which can cause increased bleeding during surgery and should not be taken for 2 weeks prior to and 1 week after surgery.  If you should need take something for relief of minor pain, you may take acetaminophen  which is found in Tylenol,m Datril, Anacin-3 and Panadol. It is not blood thinner. The products listed below are.  Do not take any of the products listed below in addition to any listed on your instruction sheet.  A.P.C or A.P.C with Codeine Codeine Phosphate Capsules #3 Ibuprofen Ridaura  ABC compound Congesprin Imuran rimadil  Advil Cope Indocin Robaxisal  Alka-Seltzer Effervescent Pain Reliever and Antacid Coricidin or Coricidin-D  Indomethacin Rufen  Alka-Seltzer plus Cold Medicine Cosprin Ketoprofen S-A-C Tablets  Anacin Analgesic Tablets or Capsules Coumadin Korlgesic Salflex  Anacin Extra Strength Analgesic tablets or capsules CP-2 Tablets Lanoril Salicylate  Anaprox Cuprimine Capsules Levenox Salocol  Anexsia-D Dalteparin Magan Salsalate  Anodynos Darvon compound Magnesium Salicylate Sine-off  Ansaid Dasin Capsules Magsal Sodium Salicylate  Anturane Depen Capsules Marnal Soma  APF Arthritis pain formula Dewitt's Pills Measurin Stanback  Argesic Dia-Gesic Meclofenamic Sulfinpyrazone  Arthritis Bayer Timed Release Aspirin Diclofenac Meclomen Sulindac  Arthritis pain formula Anacin Dicumarol Medipren Supac  Analgesic (Safety coated) Arthralgen Diffunasal Mefanamic Suprofen   Arthritis Strength Bufferin Dihydrocodeine Mepro Compound Suprol  Arthropan liquid Dopirydamole Methcarbomol with Aspirin Synalgos  ASA tablets/Enseals Disalcid Micrainin Tagament  Ascriptin Doan's Midol Talwin  Ascriptin A/D Dolene Mobidin Tanderil  Ascriptin Extra Strength Dolobid Moblgesic Ticlid  Ascriptin with Codeine Doloprin or Doloprin with Codeine Momentum Tolectin  Asperbuf Duoprin Mono-gesic Trendar  Aspergum Duradyne Motrin or Motrin IB Triminicin  Aspirin plain, buffered or enteric coated Durasal Myochrisine Trigesic  Aspirin Suppositories Easprin Nalfon Trillsate  Aspirin with Codeine Ecotrin Regular or Extra Strength Naprosyn Uracel  Atromid-S Efficin Naproxen Ursinus  Auranofin Capsules Elmiron Neocylate Vanquish  Axotal Emagrin Norgesic Verin  Azathioprine Empirin or Empirin with Codeine Normiflo Vitamin E  Azolid Emprazil Nuprin Voltaren  Bayer Aspirin plain, buffered or children's or timed BC Tablets or powders Encaprin Orgaran Warfarin Sodium  Buff-a-Comp Enoxaparin Orudis Zorpin  Buff-a-Comp with Codeine Equegesic Os-Cal-Gesic   Buffaprin Excedrin plain, buffered or Extra Strength Oxalid   Bufferin Arthritis Strength Feldene Oxphenbutazone   Bufferin plain or Extra Strength Feldene Capsules Oxycodone with Aspirin   Bufferin with Codeine Fenoprofen Fenoprofen Pabalate or Pabalate-SF   Buffets II Flogesic Panagesic   Buffinol plain or Extra Strength Florinal or Florinal with Codeine Panwarfarin   Buf-Tabs Flurbiprofen Penicillamine   Butalbital Compound Four-way cold tablets Penicillin   Butazolidin Fragmin Pepto-Bismol   Carbenicillin Geminisyn Percodan   Carna Arthritis Reliever Geopen Persantine   Carprofen Gold's salt Persistin   Chloramphenicol Goody's Phenylbutazone   Chloromycetin Haltrain Piroxlcam   Clmetidine heparin Plaquenil   Cllnoril Hyco-pap Ponstel   Clofibrate Hydroxy chloroquine Propoxyphen         Before stopping any of these medications,  be sure to consult the physician who ordered them.  Some, such as Coumadin (Warfarin) are ordered to prevent or treat serious conditions such as "deep thrombosis", "pumonary embolisms", and other heart problems.  The amount of time that you may need off of the medication may also vary with the medication and the reason for which you were taking it.  If you are taking any of these medications, please make sure you notify your pain physician before you undergo any procedures.         Epidural Steroid Injection Patient Information  Description: The epidural space surrounds the nerves as they exit the spinal cord.  In some patients, the nerves can be compressed and inflamed by a bulging disc or a tight spinal canal (spinal stenosis).  By injecting steroids into the epidural space,  we can bring irritated nerves into direct contact with a potentially helpful medication.  These steroids act directly on the irritated nerves and can reduce swelling and inflammation which often leads to decreased pain.  Epidural steroids may be injected anywhere along the spine and from the neck to the low back depending upon the location of your pain.   After numbing the skin with local anesthetic (like Novocaine), a small needle is passed into the epidural space slowly.  You may experience a sensation of pressure while this is being done.  The entire block usually last less than 10 minutes.  Conditions which may be treated by epidural steroids:   Low back and leg pain  Neck and arm pain  Spinal stenosis  Post-laminectomy syndrome  Herpes zoster (shingles) pain  Pain from compression fractures  Preparation for the injection:  1. Do not eat any solid food or dairy products within 6 hours of your appointment.  2. You may drink clear liquids up to 2 hours before appointment.  Clear liquids include water, black coffee, juice or soda.  No milk or cream please. 3. You may take your regular medication, including  pain medications, with a sip of water before your appointment  Diabetics should hold regular insulin (if taken separately) and take 1/2 normal NPH dos the morning of the procedure.  Carry some sugar containing items with you to your appointment. 4. A driver must accompany you and be prepared to drive you home after your procedure.  5. Bring all your current medications with your. 6. An IV may be inserted and sedation may be given at the discretion of the physician.   7. A blood pressure cuff, EKG and other monitors will often be applied during the procedure.  Some patients may need to have extra oxygen administered for a short period. 8. You will be asked to provide medical information, including your allergies, prior to the procedure.  We must know immediately if you are taking blood thinners (like Coumadin/Warfarin)  Or if you are allergic to IV iodine contrast (dye). We must know if you could possible be pregnant.  Possible side-effects:  Bleeding from needle site  Infection (rare, may require surgery)  Nerve injury (rare)  Numbness & tingling (temporary)  Difficulty urinating (rare, temporary)  Spinal headache ( a headache worse with upright posture)  Light -headedness (temporary)  Pain at injection site (several days)  Decreased blood pressure (temporary)  Weakness in arm/leg (temporary)  Pressure sensation in back/neck (temporary)  Call if you experience:  Fever/chills associated with headache or increased back/neck pain.  Headache worsened by an upright position.  New onset weakness or numbness of an extremity below the injection site  Hives or difficulty breathing (go to the emergency room)  Inflammation or drainage at the infection site  Severe back/neck pain  Any new symptoms which are concerning to you  Please note:  Although the local anesthetic injected can often make your back or neck feel good for several hours after the injection, the pain will likely  return.  It takes 3-7 days for steroids to work in the epidural space.  You may not notice any pain relief for at least that one week.  If effective, we will often do a series of three injections spaced 3-6 weeks apart to maximally decrease your pain.  After the initial series, we generally will wait several months before considering a repeat injection of the same type.  If you have any questions, please call [phone] [facility]

## 2015-08-11 ENCOUNTER — Encounter: Payer: Self-pay | Admitting: *Deleted

## 2015-08-18 ENCOUNTER — Other Ambulatory Visit: Payer: Self-pay | Admitting: Pain Medicine

## 2015-08-24 ENCOUNTER — Telehealth: Payer: Self-pay

## 2015-08-24 ENCOUNTER — Other Ambulatory Visit: Payer: Self-pay | Admitting: Pain Medicine

## 2015-08-24 DIAGNOSIS — Z7901 Long term (current) use of anticoagulants: Secondary | ICD-10-CM

## 2015-08-24 NOTE — Telephone Encounter (Signed)
Procedure scheduled on 9/19 Forward to [employer]

## 2015-08-24 NOTE — Telephone Encounter (Signed)
Needs to know how days should pt stop Eliqus. Pt needs lumbar epidural injection. Please call and advise

## 2015-08-24 NOTE — Telephone Encounter (Signed)
2 days before and 1 day after.

## 2015-08-25 NOTE — Telephone Encounter (Signed)
S/w pt regarding upcoming epidural injection. Pt states "I told them to cancel it because they wanted me to have some kind of test at the hospital before and I didn't want to have it" Informed patient that if he decides at a later time to have it, call us for instructions on holding Eliquis. Pt verbalized understanding but states he will not reschedule it. Pt had no further questions.

## 2015-08-29 ENCOUNTER — Ambulatory Visit: Payer: Medicare Other | Admitting: Pain Medicine

## 2015-09-01 ENCOUNTER — Telehealth: Payer: Self-pay | Admitting: *Deleted

## 2015-09-01 NOTE — Telephone Encounter (Signed)
Pt needs approval for Eliquis 5 mg BID.  PA has been filled and faxed; awaiting approval.

## 2015-09-02 ENCOUNTER — Other Ambulatory Visit: Payer: Self-pay | Admitting: *Deleted

## 2015-09-02 MED ORDER — APIXABAN 5 MG PO TABS: 5.0000 mg | ORAL_TABLET | Freq: Two times a day (BID) | ORAL | Status: DC

## 2015-09-02 NOTE — Telephone Encounter (Signed)
Pt has been approved for Eliquis 5 mg tablet 09/01/15-08/31/16.

## 2015-11-10 ENCOUNTER — Other Ambulatory Visit: Payer: Self-pay | Admitting: Cardiovascular Disease

## 2015-12-06 ENCOUNTER — Emergency Department
Admission: EM | Admit: 2015-12-06 | Discharge: 2015-12-07 | Disposition: A | Discharge status: Home / Self Care | Payer: Medicare Other | Attending: Emergency Medicine | Admitting: Emergency Medicine

## 2015-12-06 ENCOUNTER — Ambulatory Visit (INDEPENDENT_AMBULATORY_CARE_PROVIDER_SITE_OTHER): Payer: Medicare Other | Admitting: Cardiovascular Disease

## 2015-12-06 ENCOUNTER — Encounter: Payer: Self-pay | Admitting: Cardiovascular Disease

## 2015-12-06 ENCOUNTER — Emergency Department: Payer: Medicare Other

## 2015-12-06 VITALS — BP 113/76 | HR 112 | Ht 72.0 in | Wt 145.5 lb

## 2015-12-06 DIAGNOSIS — F17210 Nicotine dependence, cigarettes, uncomplicated: Secondary | ICD-10-CM | POA: Insufficient documentation

## 2015-12-06 DIAGNOSIS — Z880 Allergy status to penicillin: Secondary | ICD-10-CM | POA: Diagnosis not present

## 2015-12-06 DIAGNOSIS — I482 Chronic atrial fibrillation, unspecified: Secondary | ICD-10-CM

## 2015-12-06 DIAGNOSIS — I4891 Unspecified atrial fibrillation: Secondary | ICD-10-CM | POA: Diagnosis not present

## 2015-12-06 DIAGNOSIS — J810 Acute pulmonary edema: Secondary | ICD-10-CM | POA: Diagnosis not present

## 2015-12-06 DIAGNOSIS — I10 Essential (primary) hypertension: Secondary | ICD-10-CM | POA: Insufficient documentation

## 2015-12-06 DIAGNOSIS — R911 Solitary pulmonary nodule: Secondary | ICD-10-CM | POA: Insufficient documentation

## 2015-12-06 DIAGNOSIS — R0600 Dyspnea, unspecified: Secondary | ICD-10-CM

## 2015-12-06 DIAGNOSIS — R0602 Shortness of breath: Secondary | ICD-10-CM | POA: Diagnosis present

## 2015-12-06 DIAGNOSIS — Z79899 Other long term (current) drug therapy: Secondary | ICD-10-CM | POA: Insufficient documentation

## 2015-12-06 DIAGNOSIS — Z7902 Long term (current) use of antithrombotics/antiplatelets: Secondary | ICD-10-CM | POA: Diagnosis not present

## 2015-12-06 DIAGNOSIS — I712 Thoracic aortic aneurysm, without rupture: Secondary | ICD-10-CM | POA: Diagnosis not present

## 2015-12-06 LAB — CBC WITH DIFFERENTIAL/PLATELET
Basophils Absolute: 0 10*3/uL (ref 0–0.1)
Basophils Relative: 1 %
EOS PCT: 4 %
Eosinophils Absolute: 0.3 10*3/uL (ref 0–0.7)
HCT: 45.2 % (ref 40.0–52.0)
Hemoglobin: 14.8 g/dL (ref 13.0–18.0)
LYMPHS ABS: 2.6 10*3/uL (ref 1.0–3.6)
LYMPHS PCT: 28 %
MCH: 28 pg (ref 26.0–34.0)
MCHC: 32.7 g/dL (ref 32.0–36.0)
MCV: 85.6 fL (ref 80.0–100.0)
MONO ABS: 1.1 10*3/uL — AB (ref 0.2–1.0)
MONOS PCT: 12 %
Neutro Abs: 5.2 10*3/uL (ref 1.4–6.5)
Neutrophils Relative %: 55 %
PLATELETS: 190 10*3/uL (ref 150–440)
RBC: 5.28 MIL/uL (ref 4.40–5.90)
RDW: 16.9 % — AB (ref 11.5–14.5)
WBC: 9.3 10*3/uL (ref 3.8–10.6)

## 2015-12-06 LAB — COMPREHENSIVE METABOLIC PANEL
ALBUMIN: 3.9 g/dL (ref 3.5–5.0)
ALK PHOS: 246 U/L — AB (ref 38–126)
ALT: 138 U/L — AB (ref 17–63)
ANION GAP: 9 (ref 5–15)
AST: 137 U/L — ABNORMAL HIGH (ref 15–41)
BUN: 20 mg/dL (ref 6–20)
CALCIUM: 9.3 mg/dL (ref 8.9–10.3)
CHLORIDE: 108 mmol/L (ref 101–111)
CO2: 23 mmol/L (ref 22–32)
Creatinine, Ser: 1.11 mg/dL (ref 0.61–1.24)
GFR calc Af Amer: >60 mL/min (ref >60)
GFR calc non Af Amer: >60 mL/min (ref >60)
GLUCOSE: 101 mg/dL — AB (ref 65–99)
Potassium: 6.4 mmol/L — ABNORMAL HIGH (ref 3.5–5.1)
SODIUM: 140 mmol/L (ref 135–145)
Total Bilirubin: 1.5 mg/dL — ABNORMAL HIGH (ref 0.3–1.2)
Total Protein: 7.3 g/dL (ref 6.5–8.1)

## 2015-12-06 LAB — FIBRIN DERIVATIVES D-DIMER (ARMC ONLY): FIBRIN DERIVATIVES D-DIMER (ARMC): 1335 — AB (ref 0–499)

## 2015-12-06 LAB — TROPONIN I: Troponin I: 0.03 ng/mL (ref <0.031)

## 2015-12-06 LAB — BRAIN NATRIURETIC PEPTIDE: B Natriuretic Peptide: 1424 pg/mL — ABNORMAL HIGH (ref 0.0–100.0)

## 2015-12-06 MED ORDER — METOPROLOL TARTRATE 50 MG PO TABS: 50.0000 mg | ORAL_TABLET | Freq: Two times a day (BID) | ORAL | Status: AC

## 2015-12-06 NOTE — ED Notes (Signed)
Pt in with co shob since today worse when he walks.

## 2015-12-06 NOTE — Assessment & Plan Note (Signed)
His ventricular rate continues to be elevated. Thus, I elected to increase the dose of metoprolol to 50 mg twice daily. Continue anticoagulation. He is complaining of increased dyspnea but his oxygen saturation is 95% on room air and his lungs are relatively clear but he does have diminished breath sounds. EKG does not show any new changes. Once his heart rate is more controlled, he might require ischemic cardiac evaluation. If symptoms worsen, I instructed him to go to the emergency room for further evaluation.

## 2015-12-06 NOTE — Patient Instructions (Signed)
Medication Instructions:  Your physician has recommended you make the following change in your medication:  INCREASE metoprolol to '50mg'$  twice per day STOP taking losartan   Labwork: none  Testing/Procedures: none  Follow-Up: Your physician recommends that you schedule a follow-up appointment in: two months with Dr. Fletcher Anon.    Any Other Special Instructions Will Be Listed Below (If Applicable).     If you need a refill on your cardiac medications before your next appointment, please call your pharmacy.

## 2015-12-06 NOTE — Progress Notes (Signed)
Primary care physician:Dr. Rosario Jacks  HPI  This is a 81 year old African-American male who is here today for a follow-up visit regarding chronic atrial fibrillation . He has known history of hypertension, hyperlipidemia, tobacco use, iron deficiency anemia and chronic kidney disease.   Echocardiogram in May 2016 showed an ejection fraction of 50-55%, mild aortic and mitral regurgitation and normal atrial size. He has been treated with rate control and anticoagulation.  He continues to struggle with his dental situation. His upper teeth were extracted and he has not had enough money to get dentures. He has not been eating any solids and mostly relies on ensure.  His heart rate is elevated at home between 120-125 bpm. He noticed increased dyspnea and some wheezing recently. No chest pain or tightness.   Allergies  Allergen Reactions  . Penicillins      Current Outpatient Prescriptions on File Prior to Visit  Medication Sig Dispense Refill  . apixaban (ELIQUIS) 5 MG TABS tablet Take 1 tablet (5 mg total) by mouth 2 (two) times daily. 60 tablet 3  . lovastatin (MEVACOR) 20 MG tablet Take 20 mg by mouth at bedtime.     No current facility-administered medications on file prior to visit.     Past Medical History  Diagnosis Date  . Hyperlipidemia   . Anemia   . Vitamin D deficiency   . Hypertension   . Renal insufficiency   . Erectile dysfunction      Past Surgical History  Procedure Laterality Date  . Head surgery Left      Family History  Problem Relation Age of Onset  . Arthritis Father      Social History   Social History  . Marital Status: Married    Spouse Name: N/A  . Number of Children: N/A  . Years of Education: N/A   Occupational History  . Not on file.   Social History Main Topics  . Smoking status: Current Every Day Smoker -- 0.50 packs/day for 20 years    Types: Cigarettes  . Smokeless tobacco: Not on file  . Alcohol Use: No     Comment:  occasional  . Drug Use: No  . Sexual Activity: Not on file   Other Topics Concern  . Not on file   Social History Narrative     ROS A 10 point review of system was performed. It is negative other than that mentioned in the history of present illness.   PHYSICAL EXAM   BP 113/76 mmHg  Pulse 112  Ht 6' (1.829 m)  Wt 145 lb 8 oz (65.998 kg)  BMI 19.73 kg/m2 Constitutional: He is oriented to person, place, and time. He appears well-developed and well-nourished. No distress.  HENT: No nasal discharge.  Head: Normocephalic and atraumatic.  Eyes: Pupils are equal and round.  No discharge. Neck: Normal range of motion. Neck supple. No JVD present. No thyromegaly present.  Cardiovascular: Mildly tachycardic, irregular rhythm, normal heart sounds. Exam reveals no gallop and no friction rub. No murmur heard.  Pulmonary/Chest: Effort normal and breath sounds normal. No stridor. No respiratory distress. He has no wheezes. He has no rales. He exhibits no tenderness.  Abdominal: Soft. Bowel sounds are normal. He exhibits no distension. There is no tenderness. There is no rebound and no guarding.  Musculoskeletal: Normal range of motion. He exhibits no edema and no tenderness.  Neurological: He is alert and oriented to person, place, and time. Coordination normal.  Skin: Skin is warm and dry.  No rash noted. He is not diaphoretic. No erythema. No pallor.  Psychiatric: He has a normal mood and affect. His behavior is normal. Judgment and thought content normal.     RVU:FCZGQH fibrillation  -Old anteroseptal infarct.   -  Nonspecific T-abnormality.   ABNORMAL      ASSESSMENT AND PLAN

## 2015-12-06 NOTE — Assessment & Plan Note (Signed)
I elected to discontinue losartan given that the dose of metoprolol was increased.

## 2015-12-07 ENCOUNTER — Encounter: Payer: Self-pay | Admitting: Radiology

## 2015-12-07 ENCOUNTER — Emergency Department: Payer: Medicare Other

## 2015-12-07 LAB — POTASSIUM: POTASSIUM: 5.7 mmol/L — AB (ref 3.5–5.1)

## 2015-12-07 MED ORDER — LORAZEPAM 2 MG/ML IJ SOLN
INTRAMUSCULAR | Status: AC
  Administered 2015-12-07: 1 mg via INTRAVENOUS
  Filled 2015-12-07: qty 1

## 2015-12-07 MED ORDER — ALBUTEROL SULFATE HFA 108 (90 BASE) MCG/ACT IN AERS: 2.0000 | INHALATION_SPRAY | Freq: Four times a day (QID) | RESPIRATORY_TRACT | Status: DC | PRN

## 2015-12-07 MED ORDER — METOPROLOL TARTRATE 25 MG PO TABS
25.0000 mg | ORAL_TABLET | Freq: Once | ORAL | Status: AC
  Administered 2015-12-07: 25 mg via ORAL
  Filled 2015-12-07: qty 1

## 2015-12-07 MED ORDER — LORAZEPAM 2 MG/ML IJ SOLN
1.0000 mg | Freq: Once | INTRAMUSCULAR | Status: AC
  Administered 2015-12-07: 1 mg via INTRAVENOUS

## 2015-12-07 MED ORDER — LACTULOSE 10 GM/15ML PO SOLN
10.0000 g | Freq: Once | ORAL | Status: AC
  Administered 2015-12-07: 10 g via ORAL
  Filled 2015-12-07: qty 30

## 2015-12-07 MED ORDER — METOPROLOL TARTRATE 1 MG/ML IV SOLN
5.0000 mg | Freq: Once | INTRAVENOUS | Status: AC
  Administered 2015-12-07: 5 mg via INTRAVENOUS
  Filled 2015-12-07: qty 5

## 2015-12-07 MED ORDER — FUROSEMIDE 40 MG PO TABS: 40.0000 mg | ORAL_TABLET | Freq: Every day | ORAL | Status: DC

## 2015-12-07 MED ORDER — IOHEXOL 350 MG/ML SOLN
100.0000 mL | Freq: Once | INTRAVENOUS | Status: AC | PRN
  Administered 2015-12-07: 100 mL via INTRAVENOUS

## 2015-12-07 NOTE — ED Provider Notes (Signed)
-----------------------------------------   3:34 AM on 12/07/2015 -----------------------------------------   Blood pressure 129/63, pulse 111, temperature 98.2 F (36.8 C), temperature source Rectal, resp. rate 25, height 6' (1.829 m), weight 144 lb (65.318 kg), SpO2 96 %.  Assuming care from Dr. Marcelene Butte.  In short, Patrick Harding is a 81 y.o. male with a chief complaint of Shortness of Breath .  Refer to the original H&P for additional details.  The current plan of care is to follow-up the results of the CT scan and disposition the patient..   CT angiography chest: No acute pulmonary embolism, diffuse bronchial wall thickening and small to moderate pleural effusions, mild cardiomegaly. This could represent bronchitis or pulmonary edema. Left hilar lymphadenopathy may be reactive, recommend close attention to follow-up imaging, 6 mm right lower lobe pulmonary nodule, 5.2 cm ascending aortic aneurysm.  The patient did have some tachycardia while in the emergency department. Since he's been here for the extent of time he has I did give him a dose of metoprolol 25 mg orally and 5 mg IV. I informed the patient and his family of the aneurysm but since he is not having any chest pain I will have them follow-up with [facility] cardiothoracic surgery Dr. Maryjane Hurter.   The patient did have prescriptions written for furosemide which will help with the fluid and he also had a dose of Kayexalate. The patient will be discharged home to follow-up with his doctor as well as the cardiothoracic surgeon.  Loney Hering, MD 12/07/15 [phone]

## 2015-12-07 NOTE — Discharge Instructions (Signed)

## 2015-12-07 NOTE — ED Provider Notes (Signed)
Time Seen: Approximately 1:30  I have reviewed the triage notes  Chief Complaint: Shortness of Breath   History of Present Illness: Patrick Harding is a 81 y.o. male who describes a 4 day history of increasing shortness of breath with ambulation. The patient denies any persistent chest pain apparently was seen and evaluated by his cardiologist with some medication changes and then was referred to the emergency department for further evaluation. Patient denies any fever, cough or chest pain. He denies any calf tenderness or swelling. He does have a long tobacco history. He states he does not have an inhaler at home but has used inhalers before in the past.   Past Medical History  Diagnosis Date  . Hyperlipidemia   . Anemia   . Vitamin D deficiency   . Hypertension   . Renal insufficiency   . Erectile dysfunction     Patient Active Problem List   Diagnosis Date Noted  . DDD (degenerative disc disease), lumbar 08/10/2015  . Spinal stenosis, lumbar region, with neurogenic claudication 08/10/2015  . Facet syndrome, lumbar 08/10/2015  . Sacroiliac joint dysfunction 08/10/2015  . Atrial fibrillation, unspecified 04/14/2015  . Essential hypertension 04/14/2015    Past Surgical History  Procedure Laterality Date  . Head surgery Left     Past Surgical History  Procedure Laterality Date  . Head surgery Left     Current Outpatient Rx  Name  Route  Sig  Dispense  Refill  . apixaban (ELIQUIS) 5 MG TABS tablet   Oral   Take 1 tablet (5 mg total) by mouth 2 (two) times daily.   60 tablet   3   . lovastatin (MEVACOR) 20 MG tablet   Oral   Take 20 mg by mouth at bedtime.         . metoprolol (LOPRESSOR) 50 MG tablet   Oral   Take 1 tablet (50 mg total) by mouth 2 (two) times daily.   60 tablet   2   . Vitamin D, Ergocalciferol, (DRISDOL) 50000 units CAPS capsule   Oral   Take 50,000 Units by mouth once a week.      0     Allergies:  Penicillins  Family  History: Family History  Problem Relation Age of Onset  . Arthritis Father     Social History: Social History  Substance Use Topics  . Smoking status: Current Every Day Smoker -- 0.50 packs/day for 20 years    Types: Cigarettes  . Smokeless tobacco: None  . Alcohol Use: No     Comment: occasional     Review of Systems:   10 point review of systems was performed and was otherwise negative:  Constitutional: No fever Eyes: No visual disturbances ENT: No sore throat, ear pain Cardiac: No chest pain Respiratory: Shortness of breath is mainly with physical exertion: Family describes wheezing at night , no stridor Abdomen: No abdominal pain, no vomiting, No diarrhea Endocrine: No weight loss, No night sweats Extremities: No peripheral edema, cyanosis Skin: No rashes, easy bruising Neurologic: No focal weakness, trouble with speech or swollowing Urologic: No dysuria, Hematuria, or urinary frequency   Physical Exam:  ED Triage Vitals  Enc Vitals Group     BP 12/06/15 2020 111/95 mmHg     Pulse Rate 12/06/15 2020 101     Resp 12/06/15 2020 32     Temp --      Temp src --      SpO2 12/06/15 2043 99 %  Weight 12/06/15 2020 144 lb (65.318 kg)     Height 12/06/15 2020 6' (1.829 m)     Head Cir --      Peak Flow --      Pain Score 12/06/15 2047 0     Pain Loc --      Pain Edu? --      Excl. in [place]? --     General: Awake , Alert , and Oriented times 3; GCS 15 Head: Normal cephalic , atraumatic Eyes: Pupils equal , round, reactive to light Nose/Throat: No nasal drainage, patent upper airway without erythema or exudate.  Neck: Supple, Full range of motion, No anterior adenopathy or palpable thyroid masses Lungs: Diminished breath sounds bilaterally at the bases Heart: Regular rate, regular rhythm without murmurs , gallops , or rubs Abdomen: Soft, non tender without rebound, guarding , or rigidity; bowel sounds positive and symmetric in all 4 quadrants. No organomegaly .         Extremities: 2 plus symmetric pulses. No edema, clubbing or cyanosis Neurologic: normal ambulation, Motor symmetric without deficits, sensory intact Skin: warm, dry, no rashes   Labs:   All laboratory work was reviewed including any pertinent negatives or positives listed below:  Labs Reviewed  CBC WITH DIFFERENTIAL/PLATELET - Abnormal; Notable for the following:    RDW 16.9 (*)    Monocytes Absolute 1.1 (*)    All other components within normal limits  FIBRIN DERIVATIVES D-DIMER ([facility] ONLY) - Abnormal; Notable for the following:    Fibrin derivatives D-dimer ([facility]) 1335 (*)    All other components within normal limits  COMPREHENSIVE METABOLIC PANEL - Abnormal; Notable for the following:    Potassium 6.4 (*)    Glucose, Bld 101 (*)    AST 137 (*)    ALT 138 (*)    Alkaline Phosphatase 246 (*)    Total Bilirubin 1.5 (*)    All other components within normal limits  BRAIN NATRIURETIC PEPTIDE - Abnormal; Notable for the following:    B Natriuretic Peptide 1424.0 (*)    All other components within normal limits  POTASSIUM - Abnormal; Notable for the following:    Potassium 5.7 (*)    All other components within normal limits  TROPONIN I   reviewed his laboratory work shows slightly elevated liver function panel is of unknown significance. BNP along with a D-dimer test were both elevated  EKG:  ED ECG REPORT I, Daymon Larsen, the attending physician, personally viewed and interpreted this ECG.  Date: 12/07/2015 EKG Time: 2026 Rate: 94 Rhythm: Atrial fibrillation QRS Axis: normal Intervals: normal ST/T Wave abnormalities: normal Conduction Disutrbances: none Narrative Interpretation: unremarkable   History of hyperlipidemia and hypertension.  EXAM: CT ANGIOGRAPHY CHEST WITH CONTRAST  TECHNIQUE: Multidetector CT imaging of the chest was performed using the standard protocol during bolus administration of intravenous contrast. Multiplanar CT image  reconstructions and MIPs were obtained to evaluate the vascular anatomy.  CONTRAST: 185m OMNIPAQUE IOHEXOL 350 MG/ML SOLN  COMPARISON: Chest radiograph December 06, 2015 at 2127 hours  FINDINGS: PULMONARY ARTERY: Adequate contrast opacification of the pulmonary artery's. Main pulmonary artery is not enlarged. No pulmonary arterial filling defects to the level of the subsegmental branches.  MEDIASTINUM: The heart is mildly enlarged. No pericardial effusions. 5.2 cm proximal ascending aorta. Aneurysmal descending aorta. LEFT hilar lymphadenopathy, borderline RIGHT hilar lymphadenopathy.  LUNGS: Small to moderate layering pleural effusions. Mild compressive atelectasis. No focal consolidation. Diffuse bronchial wall thickening. Tracheobronchial tree is patent, no  pneumothorax. 6 mm RIGHT lower lobe pulmonary nodule, abutting the fissure.  SOFT TISSUES AND OSSEOUS STRUCTURES: Included view of the abdomen is unremarkable. Visualized soft tissues and included osseous structures appear normal.  Review of the MIP images confirms the above findings.  IMPRESSION: No acute pulmonary embolism.  Diffuse bronchial wall thickening and small to moderate pleural effusions. Mild cardiomegaly. This could represent bronchitis or, pulmonary edema. No focal consolidation.  LEFT hilar lymphadenopathy may be reactive though, recommend close attention on follow-up imaging to verify improvement.  6 mm RIGHT lower lobe pulmonary nodule. If the patient is at high risk for bronchogenic carcinoma, follow-up chest CT at 6-12 months is recommended. If the patient is at low risk for bronchogenic carcinoma, follow-up chest CT at 12 months is recommended. This recommendation follows the consensus statement: Guidelines for Management of Small Pulmonary Nodules Detected on CT Scans: A Statement from the [place] as published in Radiology 2005;237:395-400.  5.2 cm ascending aortic aneurysm.  Aneurysmal descending aorta. Recommend semi-annual imaging followup by CTA or MRA and referral to cardiothoracic surgery if not already obtained. This recommendation follows 2010 ACCF/AHA/AATS/ACR/ASA/SCA/SCAI/SIR/STS/SVM Guidelines for the Diagnosis and Management of Patients With Thoracic Aortic Disease. Circulation. 2010; 121: I458-K998 Radiology:      I personally reviewed the radiologic studies   P ED Course:  Patient's differential was widespread including pulmonary edema, pulmonary embolism, pulmonary hypertension, pneumonia, COPD exacerbation etc. patient's repeat potassium is still slightly elevated and could again be homolysis since it dropped without any intervention. Patient otherwise has no symptoms consistent with hyperkalemia. No EKG findings etc. I felt given his current presentation and objective findings this may be some mild pulmonary edema sensors nylon interstitial markings on his chest x-ray and also no rales on exam. Patient's prior to my departure was scheduled for a chest CT to rule out pulmonary embolism. It appears he does not have a pulmonary embolism though there was the incidental finding of a 5.2 cm proximal ascending aortic aneurysm. The patient was discharged with prescription for Lasix, albuterol inhaler, and Kayexalate. Follow-up was established by my colleague Dr. Dahlia Client.    Assessment: Pulmonary edema Thoracic aneurysm Pulmonary nodule   Final Clinical Impression * Final diagnoses:  Dyspnea     Plan:  Outpatient management Patient was advised to return immediately if condition worsens. Patient was advised to follow up with their primary care physician or other specialized physicians involved in their outpatient care            Daymon Larsen, MD 12/07/15 1421

## 2015-12-09 ENCOUNTER — Telehealth: Payer: Self-pay | Admitting: *Deleted

## 2015-12-09 NOTE — Telephone Encounter (Signed)
Spoke w/ pt's wife.   Advised her that pt's losartan was stopped at last ov.  She is appreciative of the call and will call back w/ any questions or concerns.

## 2015-12-09 NOTE — Telephone Encounter (Signed)
Pt wife calling needing to know which medication was patient recently take off from  Please advise.

## 2015-12-26 DIAGNOSIS — I5022 Chronic systolic (congestive) heart failure: Secondary | ICD-10-CM | POA: Insufficient documentation

## 2015-12-26 DIAGNOSIS — R29898 Other symptoms and signs involving the musculoskeletal system: Secondary | ICD-10-CM | POA: Insufficient documentation

## 2015-12-26 DIAGNOSIS — R634 Abnormal weight loss: Secondary | ICD-10-CM | POA: Insufficient documentation

## 2015-12-26 DIAGNOSIS — I719 Aortic aneurysm of unspecified site, without rupture: Secondary | ICD-10-CM

## 2015-12-26 DIAGNOSIS — R911 Solitary pulmonary nodule: Secondary | ICD-10-CM | POA: Insufficient documentation

## 2015-12-26 HISTORY — DX: Chronic systolic (congestive) heart failure: I50.22

## 2015-12-26 HISTORY — DX: Aortic aneurysm of unspecified site, without rupture: I71.9

## 2015-12-26 HISTORY — DX: Other symptoms and signs involving the musculoskeletal system: R29.898

## 2015-12-30 ENCOUNTER — Encounter: Payer: Self-pay | Admitting: Emergency Medicine

## 2015-12-30 ENCOUNTER — Emergency Department
Admission: EM | Admit: 2015-12-30 | Discharge: 2015-12-30 | Disposition: A | Discharge status: Home / Self Care | Payer: Medicare Other | Attending: Emergency Medicine | Admitting: Emergency Medicine

## 2015-12-30 DIAGNOSIS — Z79899 Other long term (current) drug therapy: Secondary | ICD-10-CM | POA: Diagnosis not present

## 2015-12-30 DIAGNOSIS — I10 Essential (primary) hypertension: Secondary | ICD-10-CM | POA: Diagnosis not present

## 2015-12-30 DIAGNOSIS — M545 Low back pain: Secondary | ICD-10-CM | POA: Diagnosis present

## 2015-12-30 DIAGNOSIS — Z7901 Long term (current) use of anticoagulants: Secondary | ICD-10-CM | POA: Insufficient documentation

## 2015-12-30 DIAGNOSIS — M5440 Lumbago with sciatica, unspecified side: Secondary | ICD-10-CM | POA: Insufficient documentation

## 2015-12-30 DIAGNOSIS — M5136 Other intervertebral disc degeneration, lumbar region: Secondary | ICD-10-CM | POA: Diagnosis not present

## 2015-12-30 DIAGNOSIS — M5441 Lumbago with sciatica, right side: Secondary | ICD-10-CM

## 2015-12-30 DIAGNOSIS — F17210 Nicotine dependence, cigarettes, uncomplicated: Secondary | ICD-10-CM | POA: Insufficient documentation

## 2015-12-30 DIAGNOSIS — M5442 Lumbago with sciatica, left side: Secondary | ICD-10-CM

## 2015-12-30 DIAGNOSIS — G8929 Other chronic pain: Secondary | ICD-10-CM | POA: Insufficient documentation

## 2015-12-30 DIAGNOSIS — Z880 Allergy status to penicillin: Secondary | ICD-10-CM | POA: Insufficient documentation

## 2015-12-30 HISTORY — DX: Cardiac arrhythmia, unspecified: I49.9

## 2015-12-30 MED ORDER — HYDROCODONE-ACETAMINOPHEN 5-325 MG PO TABS
1.0000 | ORAL_TABLET | ORAL | Status: AC
  Administered 2015-12-30: 1 via ORAL
  Filled 2015-12-30: qty 1

## 2015-12-30 NOTE — ED Notes (Signed)
Pt discharged home after verbalizing understanding of discharge instructions; nad noted. 

## 2015-12-30 NOTE — ED Notes (Signed)
Pt reports that he went to have his stress test and was given medication for the test. States that he became nauseous and that his legs hurt too bad to continue to the test. Pt denies that that the medication caused the pain because "this has been going on for a long time." Pt was being seen at pain management clinic but stopped going over a year ago (maybe because provider moved, but now he is back). Family states that his pain has been this intense for about 2 weeks. They have not called pain management to make an appointment yet. Pt alert & oriented. NAD noted.

## 2015-12-30 NOTE — ED Notes (Signed)
Pt wants to leave, stating that the hospitals "don't do anything for me anyway." States he has been at 5 hospitals and they don't do anything anyway.

## 2015-12-30 NOTE — ED Provider Notes (Signed)
[facility] Emergency Department Provider Note  ____________________________________________  Time seen: Approximately 10:36 AM  I have reviewed the triage vital signs and the nursing notes.   HISTORY  Chief Complaint Leg Pain    HPI Patrick Harding is a 82 y.o. male history of hypertension, renal insufficiency, chronic low back pain with 2 previous surgeries, atrial fibrillation.  Patient reports she was supposed to have his stress test today with cardiology, however while trying to lay on the table he was not able to lay long enough to have it completed due to low back pain. Reports that he has had pain like this for well over 10 years, it is chronic and when he tries to lay flat it increases and shoots down the back of both legs to the level of the knees. He reports his pain is much better with sitting up and he is currently not in significant pain aside for some ongoing shooting sensation across the back of the legs. He denies any new concerns. No abdominal pain, chest pain or trouble breathing. No weakness or numbness in the feet. No trouble walking. No trouble urinating or issues with constipation or incontinence.   Past Medical History  Diagnosis Date  . Hyperlipidemia   . Anemia   . Vitamin D deficiency   . Hypertension   . Renal insufficiency   . Erectile dysfunction   . Arrhythmia     Patient Active Problem List   Diagnosis Date Noted  . DDD (degenerative disc disease), lumbar 08/10/2015  . Spinal stenosis, lumbar region, with neurogenic claudication 08/10/2015  . Facet syndrome, lumbar 08/10/2015  . Sacroiliac joint dysfunction 08/10/2015  . Atrial fibrillation, unspecified 04/14/2015  . Essential hypertension 04/14/2015    Past Surgical History  Procedure Laterality Date  . Head surgery Left     Current Outpatient Rx  Name  Route  Sig  Dispense  Refill  . albuterol (PROVENTIL HFA;VENTOLIN HFA) 108 (90 Base) MCG/ACT inhaler   Inhalation   Inhale 2 puffs into the lungs every 6 (six) hours as needed for wheezing or shortness of breath.   1 Inhaler   2   . apixaban (ELIQUIS) 5 MG TABS tablet   Oral   Take 1 tablet (5 mg total) by mouth 2 (two) times daily.   60 tablet   3   . diltiazem (CARDIZEM CD) 120 MG 24 hr capsule   Oral   Take 120 mg by mouth daily.      0   . furosemide (LASIX) 40 MG tablet   Oral   Take 1 tablet (40 mg total) by mouth daily.   30 tablet   11   . lovastatin (MEVACOR) 20 MG tablet   Oral   Take 20 mg by mouth at bedtime.         . metoprolol (LOPRESSOR) 50 MG tablet   Oral   Take 1 tablet (50 mg total) by mouth 2 (two) times daily.   60 tablet   2   . Vitamin D, Ergocalciferol, (DRISDOL) 50000 units CAPS capsule   Oral   Take 50,000 Units by mouth once a week.      0     Allergies Penicillins and Aspirin  Family History  Problem Relation Age of Onset  . Arthritis Father     Social History Social History  Substance Use Topics  . Smoking status: Current Every Day Smoker -- 0.50 packs/day for 20 years    Types: Cigarettes  .  Smokeless tobacco: None  . Alcohol Use: No     Comment: occasional    Review of Systems Constitutional: No fever/chills Eyes: No visual changes. ENT: No sore throat. Cardiovascular: Denies chest pain. Respiratory: Denies shortness of breath. Gastrointestinal: No abdominal pain.  No nausea, no vomiting.  No diarrhea.  No constipation. Genitourinary: Negative for dysuria. Musculoskeletal: No chest pain or pain in the upper back.  Skin: Negative for rash. Neurological: Negative for headaches, focal weakness or numbness.  10-point ROS otherwise negative.  ____________________________________________   PHYSICAL EXAM:  VITAL SIGNS: ED Triage Vitals  Enc Vitals Group     BP 12/30/15 0931 134/64 mmHg     Pulse Rate 12/30/15 0931 81     Resp 12/30/15 0931 16     Temp 12/30/15 0933 98.2 F (36.8 C)     Temp Source 12/30/15 0933  Axillary     SpO2 12/30/15 0931 98 %     Weight 12/30/15 0931 136 lb (61.689 kg)     Height 12/30/15 0931 6' (1.829 m)     Head Cir --      Peak Flow --      Pain Score 12/30/15 0930 10     Pain Loc --      Pain Edu? --      Excl. in [place]? --    Constitutional: Alert and oriented. Well appearing and in no acute distress. Eyes: Conjunctivae are normal. PERRL. EOMI. Head: Atraumatic. Nose: No congestion/rhinnorhea. Mouth/Throat: Mucous membranes are moist.  Oropharynx non-erythematous. Neck: No stridor.   Cardiovascular: reg rate, irregular rhythm. Grossly normal heart sounds.  Good peripheral circulation. Respiratory: Normal respiratory effort.  No retractions. Lungs CTAB. Gastrointestinal: Soft and nontender. No distention. No abdominal bruits. No CVA tenderness. Musculoskeletal: No lower extremity tenderness nor edema.  No joint effusions. Lumbar back with old midline incision, moderate tenderness in this region to patient reports makes his symptoms worse. 5 out of 5 strength in lower extremities. No loss of sensation. Strong dorsalis pedis pulses bilateral. Neurologic:  Normal speech and language. No gross focal neurologic deficits are appreciated. No gait instability. Skin:  Skin is warm, dry and intact. No rash noted. Psychiatric: Mood and affect are normal. Speech and behavior are normal.  ____________________________________________   LABS (all labs ordered are listed, but only abnormal results are displayed)  Labs Reviewed - No data to display ____________________________________________  EKG   ____________________________________________  RADIOLOGY   ____________________________________________   PROCEDURES  Procedure(s) performed: None  Critical Care performed: No  ____________________________________________   INITIAL IMPRESSION / ASSESSMENT AND PLAN / ED COURSE  Pertinent labs & imaging results that were available during my care of the patient were  reviewed by me and considered in my medical decision making (see chart for details).  Patient presents for evaluation of low back pain. On my arrival to the room he states he just feels like he would like to go home, that he really didn't need to come here. The history gives and clinical exam seem to suggest this is very long-standing back pain. After speaking with him he does report that he would like to try one pain medicine here, but does not wish to be discharged home with a prescription for anything that can be addicting thereafter. He is awake alert and does appear to have focal tenderness and chronic pain likely from the lower back. No signs or symptoms suggesting or acute cardiac pulmonary or symptom of thoracic or aortic dissection. His pulses are intact  distally and he is very clear with his history as well as from family that this is a chronic long-standing problem.  Review of patient's single hydrocodone, discharged home with plan for follow-up with his primary care doctor. He'll call his primary today to set up close follow-up. He will also reschedule upon with cardiology.  Return precautions and treatment recommendations and follow-up discussed with the patient who is agreeable with the plan.  ____________________________________________   FINAL CLINICAL IMPRESSION(S) / ED DIAGNOSES  Final diagnoses:  DDD (degenerative disc disease), lumbar  Bilateral low back pain with sciatica, sciatica laterality unspecified      Delman Kitten, MD 12/30/15 1040

## 2015-12-30 NOTE — ED Notes (Signed)
Pain and weakness to both legs.  Has been fatigued.  Was at appt for cardiac stress test and unable to lay still for test r/t pain in legs.  Sent to ED for evaluation.

## 2015-12-30 NOTE — Discharge Instructions (Signed)
You have been seen in the Emergency Department (ED)  today for back pain.    Do not drink alcohol, drive or participate in any other potentially dangerous activities while taking this medication as it may make you sleepy.    The medicine given to you in the ER is an opiate (or narcotic) pain medication and can be habit forming.   Please follow up with your doctor as soon as possible regarding today's ED visit and your back pain.  Return to the ED for worsening back pain, fever, weakness or numbness of either leg, or if you develop either (1) an inability to urinate or have bowel movements, or (2) loss of your ability to control your bathroom functions (if you start having "accidents"), or if you develop other new symptoms that concern you.   Sciatica Sciatica is pain, weakness, numbness, or tingling along the path of the sciatic nerve. The nerve starts in the lower back and runs down the back of each leg. The nerve controls the muscles in the lower leg and in the back of the knee, while also providing sensation to the back of the thigh, lower leg, and the sole of your foot. Sciatica is a symptom of another medical condition. For instance, nerve damage or certain conditions, such as a herniated disk or bone spur on the spine, pinch or put pressure on the sciatic nerve. This causes the pain, weakness, or other sensations normally associated with sciatica. Generally, sciatica only affects one side of the body. CAUSES   Herniated or slipped disc.  Degenerative disk disease.  A pain disorder involving the narrow muscle in the buttocks (piriformis syndrome).  Pelvic injury or fracture.  Pregnancy.  Tumor (rare). SYMPTOMS  Symptoms can vary from mild to very severe. The symptoms usually travel from the low back to the buttocks and down the back of the leg. Symptoms can include:  Mild tingling or dull aches in the lower back, leg, or hip.  Numbness in the back of the calf or sole of the  foot.  Burning sensations in the lower back, leg, or hip.  Sharp pains in the lower back, leg, or hip.  Leg weakness.  Severe back pain inhibiting movement. These symptoms may get worse with coughing, sneezing, laughing, or prolonged sitting or standing. Also, being overweight may worsen symptoms. DIAGNOSIS  Your caregiver will perform a physical exam to look for common symptoms of sciatica. He or she may ask you to do certain movements or activities that would trigger sciatic nerve pain. Other tests may be performed to find the cause of the sciatica. These may include:  Blood tests.  X-rays.  Imaging tests, such as an MRI or CT scan. TREATMENT  Treatment is directed at the cause of the sciatic pain. Sometimes, treatment is not necessary and the pain and discomfort goes away on its own. If treatment is needed, your caregiver may suggest:  Over-the-counter medicines to relieve pain.  Prescription medicines, such as anti-inflammatory medicine, muscle relaxants, or narcotics.  Applying heat or ice to the painful area.  Steroid injections to lessen pain, irritation, and inflammation around the nerve.  Reducing activity during periods of pain.  Exercising and stretching to strengthen your abdomen and improve flexibility of your spine. Your caregiver may suggest losing weight if the extra weight makes the back pain worse.  Physical therapy.  Surgery to eliminate what is pressing or pinching the nerve, such as a bone spur or part of a herniated disk. HOME  CARE INSTRUCTIONS   Only take over-the-counter or prescription medicines for pain or discomfort as directed by your caregiver.  Apply ice to the affected area for 20 minutes, 3-4 times a day for the first 48-72 hours. Then try heat in the same way.  Exercise, stretch, or perform your usual activities if these do not aggravate your pain.  Attend physical therapy sessions as directed by your caregiver.  Keep all follow-up  appointments as directed by your caregiver.  Do not wear high heels or shoes that do not provide proper support.  Check your mattress to see if it is too soft. A firm mattress may lessen your pain and discomfort. SEEK IMMEDIATE MEDICAL CARE IF:   You lose control of your bowel or bladder (incontinence).  You have increasing weakness in the lower back, pelvis, buttocks, or legs.  You have redness or swelling of your back.  You have a burning sensation when you urinate.  You have pain that gets worse when you lie down or awakens you at night.  Your pain is worse than you have experienced in the past.  Your pain is lasting longer than 4 weeks.  You are suddenly losing weight without reason. MAKE SURE YOU:  Understand these instructions.  Will watch your condition.  Will get help right away if you are not doing well or get worse.   This information is not intended to replace advice given to you by your health care provider. Make sure you discuss any questions you have with your health care provider.   Document Released: 11/20/2001 Document Revised: 08/17/2015 Document Reviewed: 04/06/2012 Elsevier Interactive Patient Education [employer].

## 2016-01-02 ENCOUNTER — Ambulatory Visit: Payer: Medicare Other | Admitting: Pain Medicine

## 2016-01-03 DIAGNOSIS — R0609 Other forms of dyspnea: Secondary | ICD-10-CM

## 2016-01-03 DIAGNOSIS — I351 Nonrheumatic aortic (valve) insufficiency: Secondary | ICD-10-CM

## 2016-01-03 DIAGNOSIS — Z87891 Personal history of nicotine dependence: Secondary | ICD-10-CM | POA: Insufficient documentation

## 2016-01-03 DIAGNOSIS — J431 Panlobular emphysema: Secondary | ICD-10-CM

## 2016-01-03 DIAGNOSIS — R0600 Dyspnea, unspecified: Secondary | ICD-10-CM

## 2016-01-03 HISTORY — DX: Other forms of dyspnea: R06.09

## 2016-01-03 HISTORY — DX: Dyspnea, unspecified: R06.00

## 2016-01-03 HISTORY — DX: Nonrheumatic aortic (valve) insufficiency: I35.1

## 2016-01-03 HISTORY — DX: Panlobular emphysema: J43.1

## 2016-01-11 DIAGNOSIS — I712 Thoracic aortic aneurysm, without rupture: Secondary | ICD-10-CM | POA: Insufficient documentation

## 2016-01-11 DIAGNOSIS — I7121 Aneurysm of the ascending aorta, without rupture: Secondary | ICD-10-CM

## 2016-01-11 HISTORY — DX: Thoracic aortic aneurysm, without rupture: I71.2

## 2016-01-11 HISTORY — DX: Aneurysm of the ascending aorta, without rupture: I71.21

## 2016-01-24 ENCOUNTER — Other Ambulatory Visit: Payer: Self-pay | Admitting: Pain Medicine

## 2016-01-24 ENCOUNTER — Ambulatory Visit: Payer: Medicare Other | Attending: Pain Medicine | Admitting: Pain Medicine

## 2016-01-24 ENCOUNTER — Encounter: Payer: Self-pay | Admitting: Pain Medicine

## 2016-01-24 VITALS — BP 103/63 | HR 68 | Temp 98.1°F | Resp 18 | Ht 72.0 in | Wt 135.0 lb

## 2016-01-24 DIAGNOSIS — M533 Sacrococcygeal disorders, not elsewhere classified: Secondary | ICD-10-CM

## 2016-01-24 DIAGNOSIS — N289 Disorder of kidney and ureter, unspecified: Secondary | ICD-10-CM | POA: Diagnosis not present

## 2016-01-24 DIAGNOSIS — Z7901 Long term (current) use of anticoagulants: Secondary | ICD-10-CM | POA: Diagnosis not present

## 2016-01-24 DIAGNOSIS — M549 Dorsalgia, unspecified: Secondary | ICD-10-CM

## 2016-01-24 DIAGNOSIS — R29898 Other symptoms and signs involving the musculoskeletal system: Secondary | ICD-10-CM

## 2016-01-24 DIAGNOSIS — N529 Male erectile dysfunction, unspecified: Secondary | ICD-10-CM | POA: Diagnosis not present

## 2016-01-24 DIAGNOSIS — Z87891 Personal history of nicotine dependence: Secondary | ICD-10-CM | POA: Diagnosis not present

## 2016-01-24 DIAGNOSIS — M47816 Spondylosis without myelopathy or radiculopathy, lumbar region: Secondary | ICD-10-CM

## 2016-01-24 DIAGNOSIS — M79606 Pain in leg, unspecified: Secondary | ICD-10-CM | POA: Diagnosis present

## 2016-01-24 DIAGNOSIS — E559 Vitamin D deficiency, unspecified: Secondary | ICD-10-CM | POA: Diagnosis not present

## 2016-01-24 DIAGNOSIS — M5416 Radiculopathy, lumbar region: Secondary | ICD-10-CM

## 2016-01-24 DIAGNOSIS — M48062 Spinal stenosis, lumbar region with neurogenic claudication: Secondary | ICD-10-CM

## 2016-01-24 DIAGNOSIS — I10 Essential (primary) hypertension: Secondary | ICD-10-CM | POA: Diagnosis not present

## 2016-01-24 DIAGNOSIS — E785 Hyperlipidemia, unspecified: Secondary | ICD-10-CM | POA: Insufficient documentation

## 2016-01-24 DIAGNOSIS — M4806 Spinal stenosis, lumbar region: Secondary | ICD-10-CM | POA: Insufficient documentation

## 2016-01-24 DIAGNOSIS — M5116 Intervertebral disc disorders with radiculopathy, lumbar region: Secondary | ICD-10-CM | POA: Diagnosis not present

## 2016-01-24 DIAGNOSIS — R531 Weakness: Secondary | ICD-10-CM | POA: Insufficient documentation

## 2016-01-24 DIAGNOSIS — Z79899 Other long term (current) drug therapy: Secondary | ICD-10-CM

## 2016-01-24 DIAGNOSIS — R634 Abnormal weight loss: Secondary | ICD-10-CM

## 2016-01-24 DIAGNOSIS — M5136 Other intervertebral disc degeneration, lumbar region: Secondary | ICD-10-CM

## 2016-01-24 DIAGNOSIS — M961 Postlaminectomy syndrome, not elsewhere classified: Secondary | ICD-10-CM

## 2016-01-24 DIAGNOSIS — G8929 Other chronic pain: Secondary | ICD-10-CM | POA: Insufficient documentation

## 2016-01-24 DIAGNOSIS — M5410 Radiculopathy, site unspecified: Secondary | ICD-10-CM

## 2016-01-24 DIAGNOSIS — R937 Abnormal findings on diagnostic imaging of other parts of musculoskeletal system: Secondary | ICD-10-CM

## 2016-01-24 DIAGNOSIS — M51369 Other intervertebral disc degeneration, lumbar region without mention of lumbar back pain or lower extremity pain: Secondary | ICD-10-CM

## 2016-01-24 DIAGNOSIS — G8220 Paraplegia, unspecified: Secondary | ICD-10-CM | POA: Diagnosis not present

## 2016-01-24 DIAGNOSIS — M545 Low back pain, unspecified: Secondary | ICD-10-CM | POA: Insufficient documentation

## 2016-01-24 DIAGNOSIS — I4891 Unspecified atrial fibrillation: Secondary | ICD-10-CM | POA: Diagnosis not present

## 2016-01-24 DIAGNOSIS — M539 Dorsopathy, unspecified: Secondary | ICD-10-CM

## 2016-01-24 HISTORY — DX: Other symptoms and signs involving the musculoskeletal system: R29.898

## 2016-01-24 LAB — COMPREHENSIVE METABOLIC PANEL
ALK PHOS: 138 U/L — AB (ref 38–126)
ALT: 23 U/L (ref 17–63)
AST: 21 U/L (ref 15–41)
Albumin: 3.8 g/dL (ref 3.5–5.0)
Anion gap: 7 (ref 5–15)
BUN: 28 mg/dL — ABNORMAL HIGH (ref 6–20)
CALCIUM: 9.8 mg/dL (ref 8.9–10.3)
CO2: 31 mmol/L (ref 22–32)
CREATININE: 1.15 mg/dL (ref 0.61–1.24)
Chloride: 98 mmol/L — ABNORMAL LOW (ref 101–111)
GFR, EST NON AFRICAN AMERICAN: 59 mL/min — AB (ref >60)
Glucose, Bld: 83 mg/dL (ref 65–99)
Potassium: 4.5 mmol/L (ref 3.5–5.1)
Sodium: 136 mmol/L (ref 135–145)
TOTAL PROTEIN: 8.2 g/dL — AB (ref 6.5–8.1)
Total Bilirubin: 0.8 mg/dL (ref 0.3–1.2)

## 2016-01-24 LAB — MAGNESIUM: MAGNESIUM: 2.1 mg/dL (ref 1.7–2.4)

## 2016-01-24 LAB — SEDIMENTATION RATE: SED RATE: 12 mm/h (ref 0–20)

## 2016-01-24 NOTE — Progress Notes (Signed)
Patient's Name: Patrick Harding MRN: [id number] DOB: [birth year] DOS: 01/24/2016  Primary Reason(s) for Visit: Evaluation of a New Problem CC: Leg Pain   HPI  [name] is a 82 y.o. year old, male patient, who returns today as an established patient. He has Atrial fibrillation, unspecified; Essential hypertension; DDD (degenerative disc disease), lumbar; Spinal stenosis, lumbar region, with neurogenic claudication; Lumbar facet syndrome; Sacroiliac joint dysfunction; Abnormal loss of weight; Paraparesis (Patrick Harding); Lung mass; Atrial fibrillation (Patrick Harding); Aortic aneurysm (Patrick Harding); Acute on chronic systolic heart failure (Patrick Harding); Abnormal MRI, lumbar spine (2012); Chronic low back pain (Location of Secondary source of pain) (Bilateral) (R>L); Chronic lower extremity radicular pain (Location of Primary Source of Pain) (Right) (L3/L4 dermatome); Chronic lumbar radicular pain (Location of Primary Source of Pain) (Right) (L3/L4 dermatome); Lumbar radiculopathy, acute (Right L3/L4); Chronic pain; Long term current use of anticoagulant therapy (Eliquis); Long-term use of high-risk medication; Failed back surgical syndrome 2; and Lower extremity weakness (Right) on his problem list.. His primarily concern today is the Leg Pain   The patient returns to the clinics today after last having been seen by Dr. Devin Harding on 08/10/2015. He comes in indicated that for the past 3 weeks he has been experiencing significant weakness of the right lower extremity. He comes in rolled in on a wheelchair with difficulty even transferring himself into the bed.  Reported Pain Score: 8 , clinically he looks like a 4-5/10. Reported level is inconsistent with clinical obrservations. Pain Type: Chronic pain Pain Descriptors / Indicators:  (cannot describe pain) Pain Frequency: Constant  Date of Last Visit: 09/07/14 Service Provided on Last Visit: Evaluation  Pharmacotherapy  Medication(s): I am currently not prescribing any medications for  this patient.  Lab Work: Illicit Drugs No results found for: THCU, COCAINSCRNUR, PCPSCRNUR, MDMA, AMPHETMU, METHADONE, ETOH  Inflammation Markers Lab Results  Component Value Date   ESRSEDRATE 12 01/24/2016    Renal Function Lab Results  Component Value Date   BUN 28* 01/24/2016   CREATININE 1.15 01/24/2016   GFRAA >60 01/24/2016   GFRNONAA 59* 01/24/2016    Hepatic Function Lab Results  Component Value Date   AST 21 01/24/2016   ALT 23 01/24/2016   ALBUMIN 3.8 01/24/2016    Electrolytes Lab Results  Component Value Date   NA 136 01/24/2016   K 4.5 01/24/2016   CL 98* 01/24/2016   CALCIUM 9.8 01/24/2016   MG 2.1 01/24/2016    Allergies  [name] is allergic to penicillins and aspirin.  Meds  The patient has a current medication list which includes the following prescription(s): albuterol, apixaban, diltiazem, furosemide, metoprolol, multiple vitamins-minerals, and omega-3 fatty acids.  Current Outpatient Prescriptions on File Prior to Visit  Medication Sig  . albuterol (PROVENTIL HFA;VENTOLIN HFA) 108 (90 Base) MCG/ACT inhaler Inhale 2 puffs into the lungs every 6 (six) hours as needed for wheezing or shortness of breath.  Marland Kitchen apixaban (ELIQUIS) 5 MG TABS tablet Take 1 tablet (5 mg total) by mouth 2 (two) times daily.  Marland Kitchen diltiazem (CARDIZEM CD) 120 MG 24 hr capsule Take 120 mg by mouth daily.  . furosemide (LASIX) 40 MG tablet Take 1 tablet (40 mg total) by mouth daily.  . metoprolol (LOPRESSOR) 50 MG tablet Take 1 tablet (50 mg total) by mouth 2 (two) times daily.   No current facility-administered medications on file prior to visit.    ROS  Constitutional: The patient has lost a significant amount of weight and he is not sure why. In  addition today he is, Afebrile, no chills, well hydrated and well nourished Gastrointestinal: negative Musculoskeletal:positive for back pain and muscle weakness Neurological: positive for gait problems and  weakness Behavioral/Psych: negative  PFSH  Medical:  [name]  has a past medical history of Hyperlipidemia; Anemia; Vitamin D deficiency; Hypertension; Renal insufficiency; Erectile dysfunction; and Arrhythmia. Family: family history includes Arthritis in his father. Surgical:  has past surgical history that includes Head surgery (Left). Tobacco:  reports that he quit smoking about 3 months ago. His smoking use included Cigarettes. He has a 10 pack-year smoking history. He does not have any smokeless tobacco history on file. Alcohol:  reports that he does not drink alcohol. Drug:  reports that he does not use illicit drugs.  Physical Exam  Vitals:  Today's Vitals   01/24/16 1333  BP: 103/63  Pulse: 68  Temp: 98.1 F (36.7 C)  TempSrc: Oral  Resp: 18  Height: 6' (1.829 m)  Weight: 135 lb (61.236 kg)  SpO2: 100%  PainSc: 8     Calculated BMI: Body mass index is 18.31 kg/(m^2).  General appearance: alert, cooperative, appears stated age, cachectic, fatigued, moderate distress and slowed mentation Eyes: PERLA Respiratory: No evidence respiratory distress, no audible rales or ronchi and no use of accessory muscles of respiration  Cervical Spine Inspection: Normal anatomy Alignment: Symetrical ROM: Adequate  Upper Extremities Inspection: No gross anomalies detected ROM: Adequate Sensory: Normal Motor: Unremarkable  Lumbar Spine Inspection: Evidence of prior lumbar surgery. Alignment: Symetrical ROM: Decreased Palpation: Tender Gait: Severe right lower extremity weakness, unable to support his weight or ambulate.  Lower Extremities Inspection: No gross anomalies detected ROM: Decreased range of motion of the right knee, limited on extension. Also limited right hip range of motion. Sensory:  Impaired Motor: Weakness of the L4 and L3 nerve roots on the right side.  Toe walk (S1): Unable to accomplish with the right leg.  Heal walk (L5): Unable to accomplish with the  right leg. Pulses: Palpable DTR:  Patellar (L4): Absent on the right side. Achilles (S1): Decreased bilaterally.  Assessment & Plan  Primary Diagnosis & Pertinent Problem List: The primary encounter diagnosis was Spinal stenosis, lumbar region, with neurogenic claudication. Diagnoses of Sacroiliac joint dysfunction, Paraparesis ([place]), DDD (degenerative disc disease), lumbar, Facet syndrome, lumbar, Abnormal MRI, lumbar spine (2012), Chronic low back pain, Chronic lower extremity radicular pain, Chronic lumbar radicular pain, Lumbar radiculopathy, acute, Chronic pain, Abnormal loss of weight, Long term current use of anticoagulant therapy, Long-term use of high-risk medication, Failed back surgical syndrome 2, and Lower extremity weakness (Right) were also pertinent to this visit.  Visit Diagnosis: 1. Spinal stenosis, lumbar region, with neurogenic claudication   2. Sacroiliac joint dysfunction   3. Paraparesis ([place])   4. DDD (degenerative disc disease), lumbar   5. Facet syndrome, lumbar   6. Abnormal MRI, lumbar spine (2012)   7. Chronic low back pain   8. Chronic lower extremity radicular pain   9. Chronic lumbar radicular pain   10. Lumbar radiculopathy, acute   11. Chronic pain   12. Abnormal loss of weight   13. Long term current use of anticoagulant therapy   14. Long-term use of high-risk medication   15. Failed back surgical syndrome 2   16. Lower extremity weakness (Right)     Assessment: Lumbar radiculopathy, acute (Right L3/L4) There is evidence of a right-sided L4 radiculopathy with an L3 component. The patient's last MRI was done on 2012. His symptoms have changed significantly  and they resemble an acute radiculopathy. We will order an MRI of the lumbar spine as well as nerve conduction testing of both lower extremities since there is clear evidence of weakness on the right side.    Plan of Care  Pharmacotherapy (Medications Ordered): No orders of the defined types  were placed in this encounter.    Lab-work & Procedure Ordered: Orders Placed This Encounter  Procedures  . MR Lumbar Spine Wo Contrast    Standing Status: Future     Number of Occurrences:      Standing Expiration Date: 01/23/2017    Scheduling Instructions:     Please provide canal diameter in millimeters when describing any spinal stenosis.    Order Specific Question:  Reason for Exam (SYMPTOM  OR DIAGNOSIS REQUIRED)    Answer:  Lumbar radiculopathy/radiculitis    Order Specific Question:  Preferred imaging location?    Answer:  [facility]    Order Specific Question:  Does the patient have a pacemaker or implanted devices?    Answer:  No    Order Specific Question:  What is the patient's sedation requirement?    Answer:  No Sedation    Order Specific Question:  Call Results- Best Contact Number?    Answer:  [phone] (Pain Clinic facility) (Dr. Dossie Arbour)  . Drugs of abuse screen w/o alc, rtn urine-sln    Volume: 10 ml(s). Minimum 3 ml of urine is needed. Document temperature of fresh sample. Indications: Long term (current) use of opiate analgesic (Z79.891) Test#: [id number] ([employer] Select-13)  . Comprehensive metabolic panel    Order Specific Question:  Has the patient fasted?    Answer:  No  . C-reactive protein  . Magnesium  . Sedimentation rate  . Vitamin B12    Indication: Bone Pain (M89.9)  . Vitamin D pnl(25-hydrxy+1,25-dihy)-bld  . PSA, total and free  . CEA  . Nerve conduction test    Standing Status: Future     Number of Occurrences:      Standing Expiration Date: 01/23/2017    Scheduling Instructions:     Referral to [facility] for testing.    Order Specific Question:  Where should this test be performed?    Answer:  Other    Imaging Ordered: MR LUMBAR SPINE WO CONTRAST  Interventional Therapies: Scheduled: None at this time. PRN Procedures: Diagnostic, right sided, L3-4 lumbar epidural steroid injection under fluoroscopic  guidance, no sedation.    Referral(s) or Consult(s): None at this time.  Medications administered during this visit: [name] had no medications administered during this visit.  No future appointments.  Primary Care Physician: Casilda Carls, MD Location: [facility] Facility Note by: Kathlen Brunswick. Dossie Arbour, M.D, DABA, DABAPM, DABPM, DABIPP, FIPP

## 2016-01-24 NOTE — Progress Notes (Signed)
Quick Note:  Lab results reviewed and found to be within normal limits. ______ 

## 2016-01-24 NOTE — Assessment & Plan Note (Signed)
There is evidence of a right-sided L4 radiculopathy with an L3 component. The patient's last MRI was done on 2012. His symptoms have changed significantly and they resemble an acute radiculopathy. We will order an MRI of the lumbar spine as well as nerve conduction testing of both lower extremities since there is clear evidence of weakness on the right side.

## 2016-01-24 NOTE — Progress Notes (Signed)
Quick Note:   Normal chloride levels are between 95 and 107 mEq/L. Low levels may be due to: Addison disease; Bartter syndrome; burns; congestive heart failure; dehydration; excessive sweating; hyperaldosteronism; metabolic alkalosis; respiratory acidosis (compensated); Syndrome of inappropriate diuretic hormone secretion (SIADH); or vomiting.  BUN levels between 7 to 20 mg/dL (2.5 to 7.1 mmol/L) are considered normal. Elevated blood urea nitrogen can also be due to: urinary tract obstruction; congestive heart failure or recent heart attack; gastrointestinal bleeding; dehydration; shock; severe burns; certain medications, such as corticosteroids and some antibiotics; and/or a high protein diet.  BUN-to-creatinine ratio >20:1 (BUN dispropertionally higher than the creatinine levels) suggests prerenal azotemia (dehydration or renal hypoperfusion), while <10:1 levels suggest renal damage.  Results of a total protein test are usually considered along with those from other tests of the CMP and will give the healthcare practitioner information on a person's general health status with regard to nutrition and/or conditions involving major organs, such as the kidney and liver. A high total protein level may be seen with chronic inflammation or infections such as viral hepatitis or HIV. It also may be associated with bone marrow disorders such as multiple myeloma. Possible causes of high blood protein include: Bone marrow disorder Multiple myeloma Amyloidosis Monoclonal gammopathy of undetermined significance (MGUS) Chronic inflammatory conditions HIV/AIDS Dehydration (which may make blood proteins appear falsely elevated)  A high-protein diet doesn't cause high blood protein.  High blood protein is not a specific disease or condition in itself. It's usually a laboratory finding uncovered during the evaluation of a particular condition or symptom. For instance, although high blood protein is found in  people who are dehydrated, the real problem is that the blood plasma is actually more concentrated.  Certain proteins in the blood may be elevated as your body fights an infection or some other inflammation. People with certain bone marrow diseases, such as multiple myeloma, may have high blood protein levels before they show any other symptoms. Normal ALP (Alkaline phosphatase) levels are between 35 -105 IU/L, for our Lab. High ALP could suggest liver damage or increased bone cell activity. If other tests such as bilirubin, aspartate aminotransferase (AST), or alanine aminotransferase (ALT) are also high, usually the increased ALP is coming from the liver. Higher-than-normal ALP levels can be seen with: biliary obstruction; bone conditions; osteoblastic bone tumors; osteomalacia; a healing fracture; liver disease; hepatitis; eating a fatty meal if you have blood type O or B; hyperparathyroidism; leukemia; lymphoma; Paget disease; rickets; and/or sarcoidosis.  eGFR (Estimated Glomerular Filtration Rate) results are reported as milliliters/minute/1.1m (mL/min/1.783m. Because some laboratories do not collect information on a patient's race when the sample is collected for testing, they may report calculated results for both African Americans and non-African Americans.  The [facility] Centersuggests only reporting actual results once values are < 60 mL/min. 1. Normal values: 90-120 mL/min 2. Below 60 mL/min suggests that some kidney damage has occurred. 3. Between 5967nd 30 indicate (Moderate) Stage 3 kidney disease. 4. Between 29 and 15 represent (Severe) Stage 4 kidney disease. 5. Less than 15 is considered (Kidney Failure) Stage 5. ______

## 2016-01-24 NOTE — Patient Instructions (Signed)
Epidural Steroid Injection Patient Information  Description: The epidural space surrounds the nerves as they exit the spinal cord.  In some patients, the nerves can be compressed and inflamed by a bulging disc or a tight spinal canal (spinal stenosis).  By injecting steroids into the epidural space, we can bring irritated nerves into direct contact with a potentially helpful medication.  These steroids act directly on the irritated nerves and can reduce swelling and inflammation which often leads to decreased pain.  Epidural steroids may be injected anywhere along the spine and from the neck to the low back depending upon the location of your pain.   After numbing the skin with local anesthetic (like Novocaine), a small needle is passed into the epidural space slowly.  You may experience a sensation of pressure while this is being done.  The entire block usually last less than 10 minutes.  Conditions which may be treated by epidural steroids:   Low back and leg pain  Neck and arm pain  Spinal stenosis  Post-laminectomy syndrome  Herpes zoster (shingles) pain  Pain from compression fractures  Preparation for the injection:  1. Do not eat any solid food or dairy products within 6 hours of your appointment.  2. You may drink clear liquids up to 2 hours before appointment.  Clear liquids include water, black coffee, juice or soda.  No milk or cream please. 3. You may take your regular medication, including pain medications, with a sip of water before your appointment  Diabetics should hold regular insulin (if taken separately) and take 1/2 normal NPH dos the morning of the procedure.  Carry some sugar containing items with you to your appointment. 4. A driver must accompany you and be prepared to drive you home after your procedure.  5. Bring all your current medications with your. 6. An IV may be inserted and sedation may be given at the discretion of the physician.   7. A blood pressure  cuff, EKG and other monitors will often be applied during the procedure.  Some patients may need to have extra oxygen administered for a short period. 8. You will be asked to provide medical information, including your allergies, prior to the procedure.  We must know immediately if you are taking blood thinners (like Coumadin/Warfarin)  Or if you are allergic to IV iodine contrast (dye). We must know if you could possible be pregnant.  Possible side-effects:  Bleeding from needle site  Infection (rare, may require surgery)  Nerve injury (rare)  Numbness & tingling (temporary)  Difficulty urinating (rare, temporary)  Spinal headache ( a headache worse with upright posture)  Light -headedness (temporary)  Pain at injection site (several days)  Decreased blood pressure (temporary)  Weakness in arm/leg (temporary)  Pressure sensation in back/neck (temporary)  Call if you experience:  Fever/chills associated with headache or increased back/neck pain.  Headache worsened by an upright position.  New onset weakness or numbness of an extremity below the injection site  Hives or difficulty breathing (go to the emergency room)  Inflammation or drainage at the infection site  Severe back/neck pain  Any new symptoms which are concerning to you  Please note:  Although the local anesthetic injected can often make your back or neck feel good for several hours after the injection, the pain will likely return.  It takes 3-7 days for steroids to work in the epidural space.  You may not notice any pain relief for at least that one week.  If effective, we will often do a series of three injections spaced 3-6 weeks apart to maximally decrease your pain.  After the initial series, we generally will wait several months before considering a repeat injection of the same type.  If you have any questions, please call [phone] [facility] 

## 2016-01-25 LAB — PSA, TOTAL AND FREE
PROSTATE SPECIFIC AG, SERUM: 3.9 ng/mL (ref 0.0–4.0)
PSA FREE PCT: 18.7 %
PSA, Free: 0.73 ng/mL

## 2016-01-25 LAB — C-REACTIVE PROTEIN: CRP: 3.2 mg/dL — ABNORMAL HIGH (ref <1.0)

## 2016-01-25 LAB — CEA: CEA: 5.1 ng/mL — ABNORMAL HIGH (ref 0.0–4.7)

## 2016-01-26 ENCOUNTER — Ambulatory Visit: Payer: Medicare Other | Admitting: Cardiovascular Disease

## 2016-02-03 LAB — TOXASSURE SELECT 13 (MW), URINE: PDF: 0

## 2016-02-05 NOTE — Progress Notes (Signed)

## 2016-02-05 NOTE — Progress Notes (Signed)
Quick Note:   The carcinoembryonic antigen (CEA) test may be used in combination with other tumor markers in the evaluation of cancer. Not all cancers produce CEA, and a positive CEA test is not always due to cancer. Individuals who smoke cigarettes tend to have higher CEA levels than non-smokers. Increased CEA levels can indicate some non-cancer-related conditions, such as inflammation, cirrhosis, peptic ulcer, ulcerative colitis, rectal polyps, emphysema, and benign breast disease.  Further workup by the PCP may be needed. ______

## 2016-02-05 NOTE — Progress Notes (Signed)
Quick Note:  Lab results reviewed and found to be within normal limits. ______ 

## 2016-02-10 ENCOUNTER — Ambulatory Visit: Payer: Medicare Other | Admitting: Cardiovascular Disease

## 2016-02-17 ENCOUNTER — Emergency Department
Admission: EM | Admit: 2016-02-17 | Discharge: 2016-02-17 | Disposition: A | Discharge status: Home / Self Care | Payer: Medicare Other | Attending: Emergency Medicine | Admitting: Emergency Medicine

## 2016-02-17 ENCOUNTER — Emergency Department: Payer: Medicare Other

## 2016-02-17 ENCOUNTER — Encounter: Payer: Self-pay | Admitting: Medical Oncology

## 2016-02-17 DIAGNOSIS — J069 Acute upper respiratory infection, unspecified: Secondary | ICD-10-CM

## 2016-02-17 DIAGNOSIS — E860 Dehydration: Secondary | ICD-10-CM | POA: Insufficient documentation

## 2016-02-17 DIAGNOSIS — Z791 Long term (current) use of non-steroidal anti-inflammatories (NSAID): Secondary | ICD-10-CM | POA: Insufficient documentation

## 2016-02-17 DIAGNOSIS — N179 Acute kidney failure, unspecified: Secondary | ICD-10-CM | POA: Insufficient documentation

## 2016-02-17 DIAGNOSIS — Z79899 Other long term (current) drug therapy: Secondary | ICD-10-CM | POA: Insufficient documentation

## 2016-02-17 DIAGNOSIS — D649 Anemia, unspecified: Secondary | ICD-10-CM | POA: Insufficient documentation

## 2016-02-17 DIAGNOSIS — E785 Hyperlipidemia, unspecified: Secondary | ICD-10-CM | POA: Diagnosis not present

## 2016-02-17 DIAGNOSIS — I10 Essential (primary) hypertension: Secondary | ICD-10-CM | POA: Insufficient documentation

## 2016-02-17 DIAGNOSIS — I499 Cardiac arrhythmia, unspecified: Secondary | ICD-10-CM | POA: Insufficient documentation

## 2016-02-17 DIAGNOSIS — N289 Disorder of kidney and ureter, unspecified: Secondary | ICD-10-CM | POA: Diagnosis not present

## 2016-02-17 DIAGNOSIS — Z87891 Personal history of nicotine dependence: Secondary | ICD-10-CM | POA: Diagnosis not present

## 2016-02-17 DIAGNOSIS — J209 Acute bronchitis, unspecified: Secondary | ICD-10-CM | POA: Insufficient documentation

## 2016-02-17 DIAGNOSIS — I959 Hypotension, unspecified: Secondary | ICD-10-CM | POA: Diagnosis present

## 2016-02-17 DIAGNOSIS — J40 Bronchitis, not specified as acute or chronic: Secondary | ICD-10-CM

## 2016-02-17 LAB — HEPATIC FUNCTION PANEL
ALBUMIN: 3.1 g/dL — AB (ref 3.5–5.0)
ALT: 52 U/L (ref 17–63)
AST: 39 U/L (ref 15–41)
Alkaline Phosphatase: 176 U/L — ABNORMAL HIGH (ref 38–126)
BILIRUBIN DIRECT: 0.2 mg/dL (ref 0.1–0.5)
BILIRUBIN INDIRECT: 0.6 mg/dL (ref 0.3–0.9)
BILIRUBIN TOTAL: 0.8 mg/dL (ref 0.3–1.2)
Total Protein: 7.1 g/dL (ref 6.5–8.1)

## 2016-02-17 LAB — URINALYSIS COMPLETE WITH MICROSCOPIC (ARMC ONLY)
BILIRUBIN URINE: NEGATIVE
Bacteria, UA: NONE SEEN
GLUCOSE, UA: NEGATIVE mg/dL
HGB URINE DIPSTICK: NEGATIVE
Ketones, ur: NEGATIVE mg/dL
LEUKOCYTES UA: NEGATIVE
Nitrite: NEGATIVE
Protein, ur: NEGATIVE mg/dL
SPECIFIC GRAVITY, URINE: 1.01 (ref 1.005–1.030)
SQUAMOUS EPITHELIAL / LPF: NONE SEEN
pH: 5 (ref 5.0–8.0)

## 2016-02-17 LAB — BASIC METABOLIC PANEL
ANION GAP: 10 (ref 5–15)
BUN: 45 mg/dL — ABNORMAL HIGH (ref 6–20)
CALCIUM: 9.5 mg/dL (ref 8.9–10.3)
CO2: 29 mmol/L (ref 22–32)
Chloride: 96 mmol/L — ABNORMAL LOW (ref 101–111)
Creatinine, Ser: 1.29 mg/dL — ABNORMAL HIGH (ref 0.61–1.24)
GFR calc Af Amer: 59 mL/min — ABNORMAL LOW (ref >60)
GFR calc non Af Amer: 51 mL/min — ABNORMAL LOW (ref >60)
GLUCOSE: 117 mg/dL — AB (ref 65–99)
POTASSIUM: 4.3 mmol/L (ref 3.5–5.1)
SODIUM: 135 mmol/L (ref 135–145)

## 2016-02-17 LAB — CBC WITH DIFFERENTIAL/PLATELET
BASOS ABS: 0 10*3/uL (ref 0–0.1)
Basophils Relative: 0 %
EOS ABS: 0.3 10*3/uL (ref 0–0.7)
EOS PCT: 2 %
HCT: 38.6 % — ABNORMAL LOW (ref 40.0–52.0)
Hemoglobin: 13 g/dL (ref 13.0–18.0)
Lymphocytes Relative: 12 %
Lymphs Abs: 2.4 10*3/uL (ref 1.0–3.6)
MCH: 29 pg (ref 26.0–34.0)
MCHC: 33.8 g/dL (ref 32.0–36.0)
MCV: 85.9 fL (ref 80.0–100.0)
MONO ABS: 1.3 10*3/uL — AB (ref 0.2–1.0)
Monocytes Relative: 7 %
Neutro Abs: 15.8 10*3/uL — ABNORMAL HIGH (ref 1.4–6.5)
Neutrophils Relative %: 79 %
PLATELETS: 252 10*3/uL (ref 150–440)
RBC: 4.5 MIL/uL (ref 4.40–5.90)
RDW: 15.1 % — AB (ref 11.5–14.5)
WBC: 19.7 10*3/uL — AB (ref 3.8–10.6)

## 2016-02-17 LAB — TROPONIN I

## 2016-02-17 MED ORDER — AZITHROMYCIN 250 MG PO TABS: ORAL_TABLET | ORAL | Status: DC

## 2016-02-17 MED ORDER — AZITHROMYCIN 500 MG PO TABS
500.0000 mg | ORAL_TABLET | Freq: Once | ORAL | Status: AC
  Administered 2016-02-17: 500 mg via ORAL
  Filled 2016-02-17: qty 1

## 2016-02-17 MED ORDER — SODIUM CHLORIDE 0.9 % IV BOLUS (SEPSIS)
1000.0000 mL | Freq: Once | INTRAVENOUS | Status: AC
  Administered 2016-02-17: 1000 mL via INTRAVENOUS

## 2016-02-17 NOTE — ED Notes (Signed)
  Reviewed d/c instructions, follow-up care, and prescriptions with pt. Pt verbalized understanding 

## 2016-02-17 NOTE — Discharge Instructions (Signed)
You were evaluated for cough and low blood pressure, and were treated for dehydration with IV fluids. We discussed I do not suspect her low blood pressure is due to infectious complication called sepsis, but due to the mild cough and yellow with a white blood cell count, I will place her on antibiotic called azithromycin for upper respiratory infection.  Return to the emergency department for any new or worsening symptoms including any trouble breathing, altered mental status, chest pain, fever, low blood pressure, or any other symptoms concerning to you.   Dehydration Dehydration is when you lose more fluids from the body than you take in. Vital organs such as the kidneys, brain, and heart cannot function without a proper amount of fluids and salt. Any loss of fluids from the body can cause dehydration.  Older adults are at a higher risk of dehydration than younger adults. As we age, our bodies are less able to conserve water and do not respond to temperature changes as well. Also, older adults do not become thirsty as easily or quickly. Because of this, older adults often do not realize they need to increase fluids to avoid dehydration.  CAUSES   Vomiting.  Diarrhea.  Excessive sweating.  Excessive urination.  Fever.  Certain medicines, such as blood pressure medicines called diuretics.  Poorly controlled blood sugars. SIGNS AND SYMPTOMS  Mild dehydration:  Thirst.  Dry lips.  Slightly dry mouth. Moderate dehydration:  Very dry mouth.  Sunken eyes.  Skin does not bounce back quickly when lightly pinched and released.  Dark urine and decreased urine production.  Decreased tear production.  Headache. Severe dehydration:  Very dry mouth.  Extreme thirst.  Rapid, weak pulse (more than 100 beats per minute at rest).  Cold hands and feet.  Not able to sweat in spite of heat.  Rapid breathing.  Blue lips.  Confusion and lethargy.  Difficulty being  awakened.  Minimal urine production.  No tears. DIAGNOSIS  Your health care provider will diagnose dehydration based on your symptoms and your exam. Blood and urine tests will help confirm the diagnosis. The diagnostic evaluation should also identify the cause of dehydration. TREATMENT  Treatment of mild or moderate dehydration can often be done at home by increasing the amount of fluids that you drink. It is best to drink small amounts of fluid more often. Drinking too much at one time can make vomiting worse. Severe dehydration needs to be treated at the hospital. You may be given IV fluids that contain water and electrolytes. HOME CARE INSTRUCTIONS   Ask your health care provider about specific rehydration instructions.  Drink enough fluids to keep your urine clear or pale yellow.  Drink small amounts frequently if you have nausea and vomiting.  Eat as you normally do.  Avoid:  Foods or drinks high in sugar.  Carbonated drinks.  Juice.  Extremely hot or cold fluids.  Drinks with caffeine.  Fatty, greasy foods.  Alcohol.  Tobacco.  Overeating.  Gelatin desserts.  Wash your hands well to avoid spreading bacteria and viruses.  Only take over-the-counter or prescription medicines for pain, discomfort, or fever as directed by your health care provider.  Ask your health care provider if you should continue all prescribed and over-the-counter medicines.  Keep all follow-up appointments with your health care provider. SEEK MEDICAL CARE IF:  You have abdominal pain, and it increases or stays in one area (localizes).  You have a rash, stiff neck, or severe headache.  You are  irritable, sleepy, or difficult to awaken.  You are weak, dizzy, or extremely thirsty.  You have a fever. SEEK IMMEDIATE MEDICAL CARE IF:   You are unable to keep fluids down, or you get worse despite treatment.  You have frequent episodes of vomiting or diarrhea.  You have blood or green  matter (bile) in your vomit.  You have blood in your stool, or your stool looks black and tarry.  You have not urinated in 6-8 hours, or you have only urinated a small amount of very dark urine.  You faint. MAKE SURE YOU:   Understand these instructions.  Will watch your condition.  Will get help right away if you are not doing well or get worse.   This information is not intended to replace advice given to you by your health care provider. Make sure you discuss any questions you have with your health care provider.   Document Released: 02/16/2004 Document Revised: 12/01/2013 Document Reviewed: 08/03/2013 Elsevier Interactive Patient Education 2016 [employer]  Upper Respiratory Infection, Adult Most upper respiratory infections (URIs) are caused by a virus. A URI affects the nose, throat, and upper air passages. The most common type of URI is often called "the common cold." HOME CARE   Take medicines only as told by your doctor.  Gargle warm saltwater or take cough drops to comfort your throat as told by your doctor.  Use a warm mist humidifier or inhale steam from a shower to increase air moisture. This may make it easier to breathe.  Drink enough fluid to keep your pee (urine) clear or pale yellow.  Eat soups and other clear broths.  Have a healthy diet.  Rest as needed.  Go back to work when your fever is gone or your doctor says it is okay.  You may need to stay home longer to avoid giving your URI to others.  You can also wear a face mask and wash your hands often to prevent spread of the virus.  Use your inhaler more if you have asthma.  Do not use any tobacco products, including cigarettes, chewing tobacco, or electronic cigarettes. If you need help quitting, ask your doctor. GET HELP IF:  You are getting worse, not better.  Your symptoms are not helped by medicine.  You have chills.  You are getting more short of breath.  You have brown or red  mucus.  You have yellow or brown discharge from your nose.  You have pain in your face, especially when you bend forward.  You have a fever.  You have puffy (swollen) neck glands.  You have pain while swallowing.  You have white areas in the back of your throat. GET HELP RIGHT AWAY IF:   You have very bad or constant:  Headache.  Ear pain.  Pain in your forehead, behind your eyes, and over your cheekbones (sinus pain).  Chest pain.  You have long-lasting (chronic) lung disease and any of the following:  Wheezing.  Long-lasting cough.  Coughing up blood.  A change in your usual mucus.  You have a stiff neck.  You have changes in your:  Vision.  Hearing.  Thinking.  Mood. MAKE SURE YOU:   Understand these instructions.  Will watch your condition.  Will get help right away if you are not doing well or get worse.   This information is not intended to replace advice given to you by your health care provider. Make sure you discuss any questions you have  with your health care provider.   Document Released: 05/14/2008 Document Revised: 04/12/2015 Document Reviewed: 03/03/2014 Elsevier Interactive Patient Education [employer].

## 2016-02-17 NOTE — ED Provider Notes (Signed)
[facility] Emergency Department Provider Note   ____________________________________________  Time seen:  I have reviewed the triage vital signs and the triage nursing note.  HISTORY  Chief Complaint Hypotension   Historian Patient  HPI Patrick Harding is a 82 y.o. male is here with his spouse who brought him in for possible dehydration. They report the therapist came to the home today and found that his blood pressure was in the 90s over 40s. He's had some object increased weakness, generalized. He thinks he is dehydrated. He thinks he is probably not drinking as much as he should.  They state he was at a hospital recently and needed IV fluids for dehydration.  No Donald pain. No urinary frequency or burning. No fevers. No coughing. No chest pain.    Past Medical History  Diagnosis Date  . Hyperlipidemia   . Anemia   . Vitamin D deficiency   . Hypertension   . Renal insufficiency   . Erectile dysfunction   . Arrhythmia     Patient Active Problem List   Diagnosis Date Noted  . Abnormal MRI, lumbar spine (2012) 01/24/2016  . Chronic low back pain (Location of Secondary source of pain) (Bilateral) (R>L) 01/24/2016  . Chronic lower extremity radicular pain (Location of Primary Source of Pain) (Right) (L3/L4 dermatome) 01/24/2016  . Chronic lumbar radicular pain (Location of Primary Source of Pain) (Right) (L3/L4 dermatome) 01/24/2016  . Lumbar radiculopathy, acute (Right L3/L4) 01/24/2016  . Chronic pain 01/24/2016  . Long term current use of anticoagulant therapy (Eliquis) 01/24/2016  . Long-term use of high-risk medication 01/24/2016  . Failed back surgical syndrome 2 01/24/2016  . Lower extremity weakness (Right) 01/24/2016  . Abnormal loss of weight 12/26/2015  . 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 Lung mass 12/26/2015  . Aortic aneurysm ([place]) 12/26/2015  . Acute on chronic systolic heart failure ([place]) 12/26/2015  . DDD (degenerative disc disease),  lumbar 08/10/2015  . Spinal stenosis, lumbar region, with neurogenic claudication 08/10/2015  . Lumbar facet syndrome 08/10/2015  . Sacroiliac joint dysfunction 08/10/2015  . Atrial fibrillation, unspecified 04/14/2015  . Essential hypertension 04/14/2015  . Atrial fibrillation ([place]) 04/14/2015    Past Surgical History  Procedure Laterality Date  . Head surgery Left     Current Outpatient Rx  Name  Route  Sig  Dispense  Refill  . acetaminophen (TYLENOL) 500 MG tablet   Oral   Take 1,000 mg by mouth every 6 (six) hours as needed for mild pain or headache.         . albuterol (PROVENTIL HFA;VENTOLIN HFA) 108 (90 Base) MCG/ACT inhaler   Inhalation   Inhale 2 puffs into the lungs every 6 (six) hours as needed for wheezing or shortness of breath.   1 Inhaler   2   . apixaban (ELIQUIS) 5 MG TABS tablet   Oral   Take 1 tablet (5 mg total) by mouth 2 (two) times daily.   60 tablet   3   . diltiazem (CARDIZEM CD) 180 MG 24 hr capsule   Oral   Take 180 mg by mouth daily.         . metoprolol (LOPRESSOR) 50 MG tablet   Oral   Take 1 tablet (50 mg total) by mouth 2 (two) times daily.   60 tablet   2   . Multiple Vitamin (MULTIVITAMIN WITH MINERALS) TABS tablet   Oral   Take 1 tablet by mouth daily.         Marland Kitchen  omega-3 acid ethyl esters (LOVAZA) 1 g capsule   Oral   Take 1 g by mouth daily.         Marland Kitchen torsemide (DEMADEX) 20 MG tablet   Oral   Take 20 mg by mouth daily.         Marland Kitchen azithromycin (ZITHROMAX) 250 MG tablet      1 tablet daily for 4 days   4 each   0   . furosemide (LASIX) 40 MG tablet   Oral   Take 1 tablet (40 mg total) by mouth daily. Patient not taking: Reported on 02/17/2016   30 tablet   11     Allergies Penicillins and Aspirin  Family History  Problem Relation Age of Onset  . Arthritis Father     Social History Social History  Substance Use Topics  . Smoking status: Former Smoker -- 0.50 packs/day for 20 years    Types:  Cigarettes    Quit date: 10/24/2015  . Smokeless tobacco: None  . Alcohol Use: No     Comment: occasional    Review of Systems  Constitutional: Negative for fever. Eyes: Negative for visual changes. ENT: Negative for sore throat. Cardiovascular: Negative for chest pain. Respiratory: Negative for shortness of breath. Gastrointestinal: Negative for abdominal pain, vomiting and diarrhea. Genitourinary: Negative for dysuria. Musculoskeletal: Negative for back pain. Skin: Negative for rash. Neurological: Negative for headache. 10 point Review of Systems otherwise negative ____________________________________________   PHYSICAL EXAM:  VITAL SIGNS: ED Triage Vitals  Enc Vitals Group     BP 02/17/16 1441 83/46 mmHg     Pulse Rate 02/17/16 1441 85     Resp 02/17/16 1441 18     Temp 02/17/16 1441 97.4 F (36.3 C)     Temp Source 02/17/16 1441 Oral     SpO2 02/17/16 1441 99 %     Weight 02/17/16 1441 131 lb (59.421 kg)     Height 02/17/16 1441 '6\' 3"'$  (1.905 m)     Head Cir --      Peak Flow --      Pain Score --      Pain Loc --      Pain Edu? --      Excl. in [place]? --      Constitutional: Alert and oriented. Well appearing and in no distress. HEENT   Head: Normocephalic and atraumatic.      Eyes: Conjunctivae are normal. PERRL. Normal extraocular movements.      Ears:         Nose: No congestion/rhinnorhea.   Mouth/Throat: Mucous membranes are mildly dry.   Neck: No stridor. Cardiovascular/Chest: Normal rate, regular rhythm.  No murmurs, rubs, or gallops. Respiratory: Normal respiratory effort without tachypnea nor retractions. Breath sounds are clear and equal bilaterally. No wheezes/rales/rhonchi. Gastrointestinal: Soft. No distention, no guarding, no rebound. Nontender.  Genitourinary/rectal:Deferred Musculoskeletal: Nontender with normal range of motion in all extremities. No joint effusions.  No lower extremity tenderness.  No edema. Neurologic:  Normal  speech and language. No gross or focal neurologic deficits are appreciated. Skin:  Skin is warm, dry and intact. No rash noted. Psychiatric: Mood and affect are normal. Speech and behavior are normal. Patient exhibits appropriate insight and judgment.  ____________________________________________   EKG I, Lisa Roca, MD, the attending physician have personally viewed and interpreted all ECGs.  92 bpm. Atrial fibrillation. Narrow QRS. Normal axis. Nonspecific ST and T-wave ____________________________________________  LABS (pertinent positives/negatives)  Basic metabolic panel significant for BUN  45 and creatinine 1.29 Troponin less than 0.03 White blood count 19.7, hemoglobin 13.0 and platelet count 252 Urinalysis  Negative Blood culture sent ____________________________________________  RADIOLOGY All Xrays were viewed by me. Imaging interpreted by Radiologist.  Chest x-ray two-view:  IMPRESSION: Suspected COPD changes.  No acute abnormalities. __________________________________________  PROCEDURES  Procedure(s) performed: None  Critical Care performed: None  ____________________________________________   ED COURSE / ASSESSMENT AND PLAN  Pertinent labs & imaging results that were available during my care of the patient were reviewed by me and considered in my medical decision making (see chart for details).   Patient is here with mild hypotension without other symptoms especially with regard to infection symptoms.  His BUN and creatinine are little bit elevated from baseline, patient was given 1 L normal saline fluid bolus.  He did not have any additional hypotension and I suspect his low blood pressure was due to dehydration rather than any evidence for possible sepsis. He states that he drinks a lot of caffeinated beverages and including coffee and promises has been deep hydrated despite drinking "plenty of fluids. "  Somewhat unexpectedly unexpectedly, his white  blood cell count was found to be 19.7.  Urinalysis negative. Hepatic function reassuring. Chest x-ray without pneumonia. He does have a cough with clear sputum, but I'm somewhat concerned with history of COPD could have an atypical respiratory infection, especially in light of the elevated white blood count. I will place him on azithromycin. First dose here.     CONSULTATIONS:   None   Patient / Family / Caregiver informed of clinical course, medical decision-making process, and agree with plan.   I discussed return precautions, follow-up instructions, and discharged instructions with patient and/or family.   ___________________________________________   FINAL CLINICAL IMPRESSION(S) / ED DIAGNOSES   Final diagnoses:  Bronchitis  Upper respiratory infection  Acute renal failure, unspecified acute renal failure type ([place])  Dehydration              Note: This dictation was prepared with Dragon dictation. Any transcriptional errors that result from this process are unintentional   Lisa Roca, MD 02/17/16 1952

## 2016-02-17 NOTE — ED Notes (Signed)
Pt has a therapist that comes to his home and today when he was there it was noted that pts BP was 90's/40's per wife. Pt denies other sx's. Wife reports pts feet have been swelling more than usual.

## 2016-02-22 ENCOUNTER — Ambulatory Visit
Admission: RE | Admit: 2016-02-22 | Discharge: 2016-02-22 | Disposition: A | Discharge status: Home / Self Care | Payer: Medicare Other | Source: Ambulatory Visit | Attending: Pain Medicine | Admitting: Pain Medicine

## 2016-02-22 DIAGNOSIS — M5136 Other intervertebral disc degeneration, lumbar region: Secondary | ICD-10-CM | POA: Diagnosis not present

## 2016-02-22 DIAGNOSIS — Z9889 Other specified postprocedural states: Secondary | ICD-10-CM | POA: Insufficient documentation

## 2016-02-22 DIAGNOSIS — M5416 Radiculopathy, lumbar region: Secondary | ICD-10-CM | POA: Insufficient documentation

## 2016-02-22 LAB — CULTURE, BLOOD (ROUTINE X 2)
CULTURE: NO GROWTH
Culture: NO GROWTH

## 2016-02-23 NOTE — Progress Notes (Signed)
Quick Note:  The results of this diagnostic imaging were reviewed and found to be mildly abnormal. No acute injury or pathology identified. There appears to be no major pathology requiring urgent or emergency care. ______

## 2016-02-27 ENCOUNTER — Telehealth: Payer: Self-pay | Admitting: Pain Medicine

## 2016-02-27 NOTE — Telephone Encounter (Signed)
Would like someone to call with results on MRI please

## 2016-02-27 NOTE — Telephone Encounter (Signed)
Instructed patient to make an appointment to go over results of MRI.  Call sent to secretary.

## 2016-03-12 ENCOUNTER — Encounter: Payer: Self-pay | Admitting: Pain Medicine

## 2016-03-12 ENCOUNTER — Ambulatory Visit: Payer: Medicare Other | Attending: Pain Medicine | Admitting: Pain Medicine

## 2016-03-12 VITALS — BP 91/52 | HR 108 | Temp 97.6°F | Resp 16 | Ht 72.0 in | Wt 131.0 lb

## 2016-03-12 DIAGNOSIS — M48061 Spinal stenosis, lumbar region without neurogenic claudication: Secondary | ICD-10-CM

## 2016-03-12 DIAGNOSIS — I10 Essential (primary) hypertension: Secondary | ICD-10-CM | POA: Insufficient documentation

## 2016-03-12 DIAGNOSIS — Z87891 Personal history of nicotine dependence: Secondary | ICD-10-CM | POA: Insufficient documentation

## 2016-03-12 DIAGNOSIS — R531 Weakness: Secondary | ICD-10-CM | POA: Insufficient documentation

## 2016-03-12 DIAGNOSIS — E559 Vitamin D deficiency, unspecified: Secondary | ICD-10-CM | POA: Diagnosis not present

## 2016-03-12 DIAGNOSIS — M4806 Spinal stenosis, lumbar region: Secondary | ICD-10-CM | POA: Diagnosis not present

## 2016-03-12 DIAGNOSIS — G8220 Paraplegia, unspecified: Secondary | ICD-10-CM | POA: Insufficient documentation

## 2016-03-12 DIAGNOSIS — Z8673 Personal history of transient ischemic attack (TIA), and cerebral infarction without residual deficits: Secondary | ICD-10-CM | POA: Insufficient documentation

## 2016-03-12 DIAGNOSIS — M5410 Radiculopathy, site unspecified: Secondary | ICD-10-CM | POA: Diagnosis not present

## 2016-03-12 DIAGNOSIS — G8929 Other chronic pain: Secondary | ICD-10-CM | POA: Insufficient documentation

## 2016-03-12 DIAGNOSIS — R479 Unspecified speech disturbances: Secondary | ICD-10-CM | POA: Diagnosis not present

## 2016-03-12 DIAGNOSIS — I4891 Unspecified atrial fibrillation: Secondary | ICD-10-CM | POA: Diagnosis not present

## 2016-03-12 DIAGNOSIS — I69922 Dysarthria following unspecified cerebrovascular disease: Secondary | ICD-10-CM

## 2016-03-12 DIAGNOSIS — R200 Anesthesia of skin: Secondary | ICD-10-CM | POA: Diagnosis not present

## 2016-03-12 DIAGNOSIS — I639 Cerebral infarction, unspecified: Secondary | ICD-10-CM

## 2016-03-12 DIAGNOSIS — M549 Dorsalgia, unspecified: Secondary | ICD-10-CM | POA: Diagnosis present

## 2016-03-12 DIAGNOSIS — F809 Developmental disorder of speech and language, unspecified: Secondary | ICD-10-CM | POA: Diagnosis not present

## 2016-03-12 DIAGNOSIS — M533 Sacrococcygeal disorders, not elsewhere classified: Secondary | ICD-10-CM | POA: Insufficient documentation

## 2016-03-12 DIAGNOSIS — I5023 Acute on chronic systolic (congestive) heart failure: Secondary | ICD-10-CM | POA: Insufficient documentation

## 2016-03-12 DIAGNOSIS — M5416 Radiculopathy, lumbar region: Secondary | ICD-10-CM | POA: Insufficient documentation

## 2016-03-12 DIAGNOSIS — I69322 Dysarthria following cerebral infarction: Secondary | ICD-10-CM

## 2016-03-12 DIAGNOSIS — R634 Abnormal weight loss: Secondary | ICD-10-CM | POA: Insufficient documentation

## 2016-03-12 DIAGNOSIS — E785 Hyperlipidemia, unspecified: Secondary | ICD-10-CM | POA: Diagnosis not present

## 2016-03-12 DIAGNOSIS — R918 Other nonspecific abnormal finding of lung field: Secondary | ICD-10-CM | POA: Diagnosis not present

## 2016-03-12 HISTORY — DX: Dysarthria following cerebral infarction: I69.322

## 2016-03-12 HISTORY — DX: Spinal stenosis, lumbar region without neurogenic claudication: M48.061

## 2016-03-12 HISTORY — DX: Cerebral infarction, unspecified: I63.9

## 2016-03-12 NOTE — Progress Notes (Signed)
Safety precautions to be maintained throughout the outpatient stay will include: orient to surroundings, keep bed in low position, maintain call bell within reach at all times, provide assistance with transfer out of bed and ambulation. Color pale, weak, states he cant walk.  Speech slurred at times.  Family members at bedside.  States patient slipped out of the bed this morning.

## 2016-03-12 NOTE — Progress Notes (Signed)
Patient's Name: Patrick Harding Patient type: Established  MRN: [id number] Service setting: Ambulatory outpatient  DOB: 1934/04/15   DOS: 03/12/2016    Primary Reason(s) for Visit: Evaluation of an undiagnosed new problem with uncertain prognosis (Level of risk: moderate) CC: Back Pain   HPI  Patrick Harding is a 82 y.o. year old, male patient, who returns today as an established patient. 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[place]); Lung mass; Atrial fibrillation ([place]); Aortic aneurysm ([place]); Acute on chronic systolic heart failure ([place]); Abnormal MRI, lumbar spine (2012); Chronic low back pain (Location of Secondary source of pain) (Bilateral) (R>L); Chronic lower extremity radicular pain (Location of Primary Source of Pain) (Right) (L3/L4 dermatome); Chronic lumbar radicular pain (Location of Primary Source of Pain) (Right) (L3/L4 dermatome); Chronic pain; Long term current use of anticoagulant therapy (Eliquis); Long-term use of high-risk medication; 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 His primarily concern today is the Back Pain   Pain Assessment: Self-Reported Pain Score: 7 , clinically he looks like a 3/10. Reported level is compatible with observation Pain Type: Chronic pain Pain Location: Back Pain Orientation: Lower Pain Descriptors / Indicators: Aching, Pins and needles, Numbness Pain Frequency: Constant  The patient returns to the clinics today for follow-up evaluation after having order some tests. Today we went over the results of his lumbar MRI. They  do show some multilevel degenerative disc disease and joint disease without any focal neural impingement. Unfortunately, this does not clarify the reason why he is having the severe lower extremity weakness. However, along with his dysarthria, it is breeze safe to assume that this is secondary to a stroke. According to the patient and the family all of this started around December when he was hospitalized at [facility] due to difficulty breathing and atrial fibrillation. This was a time when he was placed on his blood thinner. They noticed that after that, he started having his dysarthria and the severe lower extremity weakness. I have ordered an MRI and waited the results of his MRI and although there is some degeneration, there is nothing that would clearly explain the symptoms that he is experiencing. At this point, I will request a neurology consult as well as an MR/MRA of his brain to look at the possibility of a stroke. I have reviewed the studies done at [facility] and I could not find a CT or an MRI of her brain meaning that they did not really look at this possibility. I have also looked at the available imaging at [facility] and again there is nothing that would indicate that this has been assessed. Today we also went over some of the lab work and there seems to be clear evidence that there are some medical problems going on with this patient. The last basic metabolic panel that we have available is 1 ordered by Dr. Lisa Roca where there is a BUN of 45 and a creatinine level of 1.29 suggesting a prerenal azotemia. In any case I have explained to the patient and the family that this is something that needs to be evaluated and treated by his primary care physician Dr. Rosario Jacks. I'm also concerned about the fact that his CEA came back  elevated. I told the patient to follow up with his primary care physician, but for some reason he appears not to have done that.  Date of Last Visit: 01/24/16 Service Provided on Last Visit:  Evaluation  Controlled Substance Pharmacotherapy Assessment  Analgesic: No opioids being prescribed by this practice. Pill Count: Nonapplicable. MME/day: 0 mg/day.  Pharmacokinetics: N/A Pharmacodynamics: N/A Monitoring: N/A Risk Assessment: Aberrant Behavior: None observed today Substance Use Disorder (SUD) Risk Level: Low Risk of opioid abuse or dependence: 0.7-3.0% with doses ? 36 MME/day and 6.1-26% with doses ? 120 MME/day. Opioid Risk Tool (ORT) Score:  0 Low Risk for SUD (Score <3) Depression Scale Score: PHQ-2: PHQ-2 Total Score: 0 No depression (0) PHQ-9: PHQ-9 Total Score: 0 No depression (0-4)  Pharmacologic Plan: No change in therapy, at this time  Laboratory Chemistry  Inflammation Markers Lab Results  Component Value Date   ESRSEDRATE 12 01/24/2016   CRP 3.2* 01/24/2016    Renal Function Lab Results  Component Value Date   BUN 45* 02/17/2016   CREATININE 1.29* 02/17/2016   GFRAA 59* 02/17/2016   GFRNONAA 51* 02/17/2016    Hepatic Function Lab Results  Component Value Date   AST 39 02/17/2016   ALT 52 02/17/2016   ALBUMIN 3.1* 02/17/2016    Electrolytes Lab Results  Component Value Date   NA 135 02/17/2016   K 4.3 02/17/2016   CL 96* 02/17/2016   CALCIUM 9.5 02/17/2016   MG 2.1 01/24/2016    Pain Modulating Vitamins No results found for: [place], VD125OH2TOT, [id number], NW2956OZ3, VITAMINB12  Coagulation Parameters No results found for: INR, LABPROT  Note: I personally reviewed the above data. Results shared with patient and family.   Meds  The patient has a current medication list which includes the following prescription(s): acetaminophen, albuterol, apixaban, diltiazem, lovastatin, metoprolol, multivitamin with minerals, omega-3 acid ethyl esters, and torsemide.  Current Outpatient Prescriptions on File Prior to Visit  Medication Sig  . acetaminophen (TYLENOL) 500 MG tablet Take 1,000 mg by mouth every 6 (six) hours as needed for  mild pain or headache.  . albuterol (PROVENTIL HFA;VENTOLIN HFA) 108 (90 Base) MCG/ACT inhaler Inhale 2 puffs into the lungs every 6 (six) hours as needed for wheezing or shortness of breath.  Marland Kitchen apixaban (ELIQUIS) 5 MG TABS tablet Take 1 tablet (5 mg total) by mouth 2 (two) times daily.  Marland Kitchen diltiazem (CARDIZEM CD) 180 MG 24 hr capsule Take 180 mg by mouth daily.  . metoprolol (LOPRESSOR) 50 MG tablet Take 1 tablet (50 mg total) by mouth 2 (two) times daily.  . Multiple Vitamin (MULTIVITAMIN WITH MINERALS) TABS tablet Take 1 tablet by mouth daily.  Marland Kitchen omega-3 acid ethyl esters (LOVAZA) 1 g capsule Take 1 g by mouth daily. Reported on 03/12/2016  . torsemide (DEMADEX) 20 MG tablet Take 20 mg by mouth daily.   No current facility-administered medications on file prior to visit.    ROS  Constitutional: Afebrile, no chills, well hydrated and well nourished Gastrointestinal: No upper or lower GI bleeding, no nausea, no vomiting and no acute GI distress Musculoskeletal: No acute joint swelling or redness, no acute loss of range of motion and no acute onset weakness Neurological: Denies any acute onset apraxia, no episodes of paralysis, no acute loss of coordination, no acute loss of consciousness and no acute onset aphasia, dysarthria, agnosia, or amnesia  Allergies  Patrick Harding is allergic to aspirin and penicillins.  [place]  Medical:  Patrick Harding  has a past  medical history of Hyperlipidemia; Anemia; Vitamin D deficiency; Hypertension; Renal insufficiency; Erectile dysfunction; and Arrhythmia. Family: family history includes Arthritis in his father. Surgical:  has past surgical history that includes Head surgery (Left). Tobacco:  reports that he quit smoking about 4 months ago. His smoking use included Cigarettes. He has a 10 pack-year smoking history. He does not have any smokeless tobacco history on file. Alcohol:  reports that he does not drink alcohol. Drug:  reports that he does not use illicit  drugs.  Physical Examination  Constitutional Vitals:  Today's Vitals   03/12/16 1423 03/12/16 1426  BP:  91/52  Pulse: 108   Temp: 97.6 F (36.4 C)   Resp: 16   Height: 6' (1.829 m)   Weight: 131 lb (59.421 kg)   SpO2: 98%   PainSc: 7  7   PainLoc: Back     Calculated BMI: Body mass index is 17.76 kg/(m^2).    General appearance: alert, cooperative, appears stated age, cachectic and no distress. The patient also presents with dysarthria. Eyes: PERLA Respiratory: No evidence respiratory distress, no audible rales or ronchi and no use of accessory muscles of respiration  Cervical Spine Inspection: Normal anatomy Alignment: Symetrical AROM: Adequate  Upper Extremities Right  Left  Inspection: No gross anomalies detected  Inspection: No gross anomalies detected  AROM: Adequate  AROM: Adequate  Sensory: Normal  Sensory: Normal  Motor: Unremarkable       Motor: Unremarkable        Thoracic Spine Inspection: No gross anomalies detected Alignment: Symetrical AROM: Adequate  Lumbar Spine Inspection: No gross anomalies detected Alignment: Symetrical AROM: Adequate  Gait: Patient comes in today in a wheel chair  Lower Extremities Right  Left  Inspection: No gross anomalies detected  Inspection: No gross anomalies detected  AROM: Adequate  AROM: Adequate  Sensory:  Normal  Sensory:  Normal  Motor: Abnormal Movement possible, but not against gravity (2/5)  Motor: Abnormal Movement possible against gravity, but not against resistance (3/5)   Assessment & Plan  Primary Diagnosis & Pertinent Problem List: The primary encounter diagnosis was Chronic pain. 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  Visit Diagnosis: 1. Chronic pain   2. Chronic lower extremity radicular pain (Location of Primary Source of Pain) (Right) (L3/L4 dermatome)   3. Paraparesis ([place])   4. Speech and language deficits   5. Dysarthria due to recent stroke   6. Stroke determined by clinical assessment ([place])   7. 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   Plan of Care  Pharmacotherapy (Medications Ordered): No orders of the defined types were placed in this encounter.    Lab-work & Procedure Ordered: Orders Placed This Encounter  Procedures  . MR MRA HEAD WO CONTRAST    Standing Status: Future     Number of Occurrences:      Standing Expiration Date: 05/12/2017    Order Specific Question:  Reason for Exam (SYMPTOM  OR DIAGNOSIS REQUIRED)    Answer:  Right leg weakness and speech difficulty thought to be a stroke.    Order Specific Question:  Preferred imaging location?    Answer:  [facility] (table limit-300lbs)    Order Specific Question:  What is the patient's sedation requirement?    Answer:  No Sedation    Order Specific Question:  Does the patient have a pacemaker or implanted devices?    Answer:  No  . Ambulatory referral to Neurology    Referral Priority:  Routine    Referral Type:  Consultation    Referral Reason:  Specialty Services Required    Referred to Provider:  Anabel Bene, MD    Requested Specialty:  Neurology    Number of Visits Requested:  1  . NCV with EMG(electromyography)    Bilateral testing requested.    Standing Status: Future     Number of Occurrences:      Standing Expiration Date: 03/12/2017    Scheduling Instructions:     Please refer this patient to [facility] Neurology for Nerve Conduction testing of the lower extremities. (EMG & PNCV)    Order Specific Question:   Where should this test be performed?    Answer:  Other    Imaging Ordered: AMB REFERRAL TO NEUROLOGY MR MRA HEAD WO CONTRAST  Interventional Therapies: Scheduled:  None at this time. Considering:  None at this time. PRN Procedures:  None at this time.   Referral(s) or Consult(s): Neurology consult for evaluation and management of a possible stroke. In addition, we're requesting evaluation of the patient's bilateral lower extremity weakness and dysarthria.  Medications administered during this visit: Patrick Harding had no medications administered during this visit.  No future appointments.  Primary Care Physician: Casilda Carls, MD Location: [facility] Outpatient Pain Management Facility Note by: Kathlen Brunswick. Dossie Arbour, M.D, DABA, DABAPM, DABPM, DABIPP, FIPP  Pain Score Disclaimer: We use the NRS-11 scale. This is a self-reported, subjective measurement of pain severity with only modest accuracy. It is used primarily to identify changes within a particular patient. It must be understood that outpatient pain scales are significantly less accurate that those used for research, where they can be applied under ideal controlled circumstances with minimal exposure to variables. In reality, the score is likely to be a combination of pain intensity and pain affect, where pain affect describes the degree of emotional arousal or changes in action readiness caused by the sensory experience of pain. Factors such as social and work situation, setting, emotional state, anxiety levels, expectation, and prior pain experience may influence pain perception and show large inter-individual differences that may also be affected by time variables.

## 2016-03-19 ENCOUNTER — Emergency Department: Payer: Medicare Other

## 2016-03-19 ENCOUNTER — Telehealth: Payer: Self-pay

## 2016-03-19 ENCOUNTER — Other Ambulatory Visit: Payer: Self-pay | Admitting: Internal Medicine

## 2016-03-19 ENCOUNTER — Inpatient Hospital Stay
Admission: EM | Admit: 2016-03-19 | Discharge: 2016-03-22 | DRG: 193 | Disposition: A | Discharge status: Home Health | Payer: Medicare Other | Attending: Internal Medicine | Admitting: Internal Medicine

## 2016-03-19 ENCOUNTER — Encounter: Payer: Self-pay | Admitting: Emergency Medicine

## 2016-03-19 DIAGNOSIS — E43 Unspecified severe protein-calorie malnutrition: Secondary | ICD-10-CM | POA: Diagnosis present

## 2016-03-19 DIAGNOSIS — R531 Weakness: Secondary | ICD-10-CM | POA: Diagnosis present

## 2016-03-19 DIAGNOSIS — Z681 Body mass index (BMI) 19 or less, adult: Secondary | ICD-10-CM

## 2016-03-19 DIAGNOSIS — I4891 Unspecified atrial fibrillation: Secondary | ICD-10-CM | POA: Diagnosis present

## 2016-03-19 DIAGNOSIS — G8220 Paraplegia, unspecified: Secondary | ICD-10-CM

## 2016-03-19 DIAGNOSIS — M549 Dorsalgia, unspecified: Secondary | ICD-10-CM | POA: Diagnosis present

## 2016-03-19 DIAGNOSIS — G8929 Other chronic pain: Secondary | ICD-10-CM | POA: Diagnosis present

## 2016-03-19 DIAGNOSIS — Z87891 Personal history of nicotine dependence: Secondary | ICD-10-CM

## 2016-03-19 DIAGNOSIS — Z7901 Long term (current) use of anticoagulants: Secondary | ICD-10-CM

## 2016-03-19 DIAGNOSIS — R6251 Failure to thrive (child): Secondary | ICD-10-CM

## 2016-03-19 DIAGNOSIS — K5900 Constipation, unspecified: Secondary | ICD-10-CM | POA: Diagnosis present

## 2016-03-19 DIAGNOSIS — Z8261 Family history of arthritis: Secondary | ICD-10-CM

## 2016-03-19 DIAGNOSIS — I129 Hypertensive chronic kidney disease with stage 1 through stage 4 chronic kidney disease, or unspecified chronic kidney disease: Secondary | ICD-10-CM | POA: Diagnosis present

## 2016-03-19 DIAGNOSIS — E785 Hyperlipidemia, unspecified: Secondary | ICD-10-CM | POA: Diagnosis present

## 2016-03-19 DIAGNOSIS — N183 Chronic kidney disease, stage 3 (moderate): Secondary | ICD-10-CM | POA: Diagnosis present

## 2016-03-19 DIAGNOSIS — R627 Adult failure to thrive: Secondary | ICD-10-CM | POA: Diagnosis present

## 2016-03-19 DIAGNOSIS — R479 Unspecified speech disturbances: Secondary | ICD-10-CM

## 2016-03-19 DIAGNOSIS — F809 Developmental disorder of speech and language, unspecified: Secondary | ICD-10-CM

## 2016-03-19 DIAGNOSIS — I509 Heart failure, unspecified: Secondary | ICD-10-CM | POA: Diagnosis present

## 2016-03-19 DIAGNOSIS — J189 Pneumonia, unspecified organism: Principal | ICD-10-CM | POA: Diagnosis present

## 2016-03-19 DIAGNOSIS — N289 Disorder of kidney and ureter, unspecified: Secondary | ICD-10-CM

## 2016-03-19 DIAGNOSIS — L89152 Pressure ulcer of sacral region, stage 2: Secondary | ICD-10-CM | POA: Diagnosis present

## 2016-03-19 DIAGNOSIS — L8990 Pressure ulcer of unspecified site, unspecified stage: Secondary | ICD-10-CM | POA: Insufficient documentation

## 2016-03-19 DIAGNOSIS — I712 Thoracic aortic aneurysm, without rupture: Secondary | ICD-10-CM | POA: Diagnosis present

## 2016-03-19 DIAGNOSIS — Z79899 Other long term (current) drug therapy: Secondary | ICD-10-CM

## 2016-03-19 DIAGNOSIS — R634 Abnormal weight loss: Secondary | ICD-10-CM | POA: Diagnosis present

## 2016-03-19 DIAGNOSIS — Z800 Family history of malignant neoplasm of digestive organs: Secondary | ICD-10-CM

## 2016-03-19 HISTORY — DX: Unspecified atrial fibrillation: I48.91

## 2016-03-19 LAB — CBC WITH DIFFERENTIAL/PLATELET
Basophils Absolute: 0 10*3/uL (ref 0–0.1)
Basophils Relative: 0 %
EOS ABS: 0.3 10*3/uL (ref 0–0.7)
Eosinophils Relative: 2 %
HEMATOCRIT: 36.5 % — AB (ref 40.0–52.0)
HEMOGLOBIN: 12.3 g/dL — AB (ref 13.0–18.0)
LYMPHS ABS: 2.7 10*3/uL (ref 1.0–3.6)
Lymphocytes Relative: 18 %
MCH: 28.9 pg (ref 26.0–34.0)
MCHC: 33.8 g/dL (ref 32.0–36.0)
MCV: 85.5 fL (ref 80.0–100.0)
MONOS PCT: 8 %
Monocytes Absolute: 1.2 10*3/uL — ABNORMAL HIGH (ref 0.2–1.0)
Neutro Abs: 10.6 10*3/uL — ABNORMAL HIGH (ref 1.4–6.5)
Neutrophils Relative %: 72 %
Platelets: 225 10*3/uL (ref 150–440)
RBC: 4.26 MIL/uL — ABNORMAL LOW (ref 4.40–5.90)
RDW: 15 % — ABNORMAL HIGH (ref 11.5–14.5)
WBC: 14.9 10*3/uL — ABNORMAL HIGH (ref 3.8–10.6)

## 2016-03-19 LAB — BASIC METABOLIC PANEL WITH GFR
Anion gap: 8 (ref 5–15)
BUN: 37 mg/dL — ABNORMAL HIGH (ref 6–20)
CO2: 27 mmol/L (ref 22–32)
Calcium: 9.3 mg/dL (ref 8.9–10.3)
Chloride: 99 mmol/L — ABNORMAL LOW (ref 101–111)
Creatinine, Ser: 1.31 mg/dL — ABNORMAL HIGH (ref 0.61–1.24)
GFR calc Af Amer: 58 mL/min — ABNORMAL LOW
GFR calc non Af Amer: 50 mL/min — ABNORMAL LOW
Glucose, Bld: 117 mg/dL — ABNORMAL HIGH (ref 65–99)
Potassium: 3.4 mmol/L — ABNORMAL LOW (ref 3.5–5.1)
Sodium: 134 mmol/L — ABNORMAL LOW (ref 135–145)

## 2016-03-19 LAB — LACTIC ACID, PLASMA: Lactic Acid, Venous: 1.4 mmol/L (ref 0.5–2.0)

## 2016-03-19 LAB — TROPONIN I: Troponin I: 0.03 ng/mL

## 2016-03-19 MED ORDER — SODIUM CHLORIDE 0.9 % IV BOLUS (SEPSIS)
1000.0000 mL | Freq: Once | INTRAVENOUS | Status: AC
  Administered 2016-03-19: 1000 mL via INTRAVENOUS

## 2016-03-19 NOTE — ED Notes (Signed)
Pt states unable to give urine at this time

## 2016-03-19 NOTE — ED Notes (Addendum)
Wife st BP 99/45 at home; st weakness today with decreased PO intake; sched for brain MRI at 8am; pt taken to room 19 and placed on card monitor for EKG reading by ED tech, Almyra Free

## 2016-03-19 NOTE — ED Provider Notes (Signed)
[facility] Department Provider Note   ____________________________________________  Time seen: ~2035  I have reviewed the triage vital signs and the nursing notes.   HISTORY  Chief Complaint Weakness   History limited by: Not Limited   HPI Travarus Harding is a 82 y.o. male who presents to the emergency department today because of concerns for low blood pressure and weakness. The patient states that he checks his blood pressure every day. He states that when he checked it today it was 90s over 31s. He had been feeling weak throughout the day. This has happened to him in the past. Family states this is now the fourth time he has had weakness and low blood pressure. They state they have not gotten any answers as to why this continues to happen. Additionally the patient has complained of cough. He states that he has had a chest x-ray which did not show any pneumonia. He denies any recent fevers. Denies any chest pain shortness breath or abdominal pain.    Past Medical History  Diagnosis Date  . Hyperlipidemia   . Anemia   . Vitamin D deficiency   . Hypertension   . Renal insufficiency   . Erectile dysfunction   . Arrhythmia     Patient Active Problem List   Diagnosis Date Noted  . Speech and language deficits 03/12/2016  . Dysarthria due to recent stroke 03/12/2016  . Stroke determined by clinical assessment ([place]) 03/12/2016  . Lumbar foraminal stenosis (Left Moderate-severe L4-5) (Bilateral Moderate L5-S1) 03/12/2016  . Abnormal MRI, lumbar spine (2012) 01/24/2016  . Chronic low back pain (Location of Secondary source of pain) (Bilateral) (R>L) 01/24/2016  . Chronic lower extremity radicular pain (Location of Primary Source of Pain) (Right) (L3/L4 dermatome) 01/24/2016  . Chronic lumbar radicular pain (Location of Primary Source of Pain) (Right) (L3/L4 dermatome) 01/24/2016  . Chronic pain 01/24/2016  . Long term current use of anticoagulant  therapy (Eliquis) 01/24/2016  . Long-term use of high-risk medication 01/24/2016  . 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 Lower extremity weakness (Right) 01/24/2016  . Abnormal loss of weight 12/26/2015  . 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 Lung mass 12/26/2015  . Aortic aneurysm ([place]) 12/26/2015  . Acute on chronic systolic heart failure ([place]) 12/26/2015  . Spinal stenosis, lumbar region, with neurogenic claudication 08/10/2015  . Lumbar facet syndrome 08/10/2015  . Sacroiliac joint dysfunction 08/10/2015  . Atrial fibrillation, unspecified 04/14/2015  . Essential hypertension 04/14/2015  . Atrial fibrillation ([employer]) 04/14/2015    Past Surgical History  Procedure Laterality Date  . Head surgery Left     Current Outpatient Rx  Name  Route  Sig  Dispense  Refill  . acetaminophen (TYLENOL) 500 MG tablet   Oral   Take 1,000 mg by mouth every 6 (six) hours as needed for mild pain or headache.         . albuterol (PROVENTIL HFA;VENTOLIN HFA) 108 (90 Base) MCG/ACT inhaler   Inhalation   Inhale 2 puffs into the lungs every 6 (six) hours as needed for wheezing or shortness of breath.   1 Inhaler   2   . apixaban (ELIQUIS) 5 MG TABS tablet   Oral   Take 1 tablet (5 mg total) by mouth 2 (two) times daily.   60 tablet   3   . diltiazem (CARDIZEM CD) 180 MG 24 hr capsule   Oral   Take 180 mg by mouth daily.         Marland Kitchen  metoprolol (LOPRESSOR) 50 MG tablet   Oral   Take 1 tablet (50 mg total) by mouth 2 (two) times daily.   60 tablet   2   . Multiple Vitamin (MULTIVITAMIN WITH MINERALS) TABS tablet   Oral   Take 1 tablet by mouth daily.         Marland Kitchen omega-3 acid ethyl esters (LOVAZA) 1 g capsule   Oral   Take 1 g by mouth daily. Reported on 03/12/2016         . torsemide (DEMADEX) 20 MG tablet   Oral   Take 20 mg by mouth daily.         Marland Kitchen lovastatin (MEVACOR) 20 MG tablet   Oral   Take 20 mg by mouth.            Allergies Aspirin and Penicillins  Family History  Problem Relation Age of Onset  . Arthritis Father     Social History Social History  Substance Use Topics  . Smoking status: Former Smoker -- 0.50 packs/day for 20 years    Types: Cigarettes    Quit date: 10/24/2015  . Smokeless tobacco: None  . Alcohol Use: No     Comment: occasional    Review of Systems  Constitutional: Negative for fever. Cardiovascular: Negative for chest pain. Respiratory: Negative for shortness of breath. Gastrointestinal: Negative for abdominal pain, vomiting and diarrhea. Neurological: Negative for headaches, focal weakness or numbness.  10-point ROS otherwise negative.  ____________________________________________   PHYSICAL EXAM:  VITAL SIGNS: ED Triage Vitals  Enc Vitals Group     BP 03/19/16 2026 100/45 mmHg     Pulse Rate 03/19/16 2026 67     Resp 03/19/16 2026 20     Temp 03/19/16 2026 97.7 F (36.5 C)     Temp Source 03/19/16 2026 Oral     SpO2 03/19/16 2026 99 %     Weight 03/19/16 2026 121 lb (54.885 kg)     Height 03/19/16 2026 '6\' 1"'$  (1.854 m)     Head Cir --      Peak Flow --      Pain Score 03/19/16 2034 0   Constitutional: Awake alert and oriented. Cachectic. Eyes: Conjunctivae are normal. PERRL. Normal extraocular movements. ENT   Head: Normocephalic and atraumatic.   Nose: No congestion/rhinnorhea.   Mouth/Throat: Mucous membranes are moist.   Neck: No stridor. Hematological/Lymphatic/Immunilogical: No cervical lymphadenopathy. Cardiovascular: Tachycardic, irregularly irregular rhythm.  No murmurs, rubs, or gallops. Respiratory: Normal respiratory effort without tachypnea nor retractions. Breath sounds are clear and equal bilaterally. No wheezes/rales/rhonchi. Gastrointestinal: Soft and nontender. No distention.  Genitourinary: Deferred Musculoskeletal: Normal range of motion in all extremities. No joint effusions.  No lower extremity tenderness  nor edema. Neurologic:  Normal speech and language. No gross focal neurologic deficits are appreciated.  Skin:  Skin is warm, dry and intact. No rash noted. Psychiatric: Mood and affect are normal. Speech and behavior are normal. Patient exhibits appropriate insight and judgment.  ____________________________________________    LABS (pertinent positives/negatives)  Trop 0.03 WBC 14.9 Hgb 12.3 Plt 225  ____________________________________________   EKG  I, Nance Pear, attending physician, personally viewed and interpreted this EKG  EKG Time: 2035 Rate: 128 Rhythm: atrial fibrillation with RVR Axis: normal Intervals: qtc 435 QRS: narrow, q waves V1, V2, V3 ST changes: no st elevation Impression: abnormal ekg  ____________________________________________    RADIOLOGY  CXR  IMPRESSION: 9 x 6 mm nodular opacity right upper lobe. Advise noncontrast enhanced chest  CT to further assess. No edema or consolidation  CT chest pending at time of sign out  ____________________________________________   PROCEDURES  Procedure(s) performed: None  Critical Care performed: No  ____________________________________________   INITIAL IMPRESSION / ASSESSMENT AND PLAN / ED COURSE  Pertinent labs & imaging results that were available during my care of the patient were reviewed by me and considered in my medical decision making (see chart for details).  Patient presented to the emergency department today because of concerns for hypotension and weakness. On exam patient is cachectic. Appears somewhat chronically ill. Blood work shows a mild leukocytosis. Chest x-ray showed a concerning nodule. A CT scan was ordered to further evaluate. This was pending at the time of sign out.  ____________________________________________   FINAL CLINICAL IMPRESSION(S) / ED DIAGNOSES  Weakness Hypotension  Nance Pear, MD 03/20/16 1600

## 2016-03-20 ENCOUNTER — Encounter: Payer: Self-pay | Admitting: Internal Medicine

## 2016-03-20 ENCOUNTER — Inpatient Hospital Stay: Payer: Medicare Other

## 2016-03-20 ENCOUNTER — Ambulatory Visit: Payer: Medicare Other

## 2016-03-20 DIAGNOSIS — Z681 Body mass index (BMI) 19 or less, adult: Secondary | ICD-10-CM | POA: Diagnosis not present

## 2016-03-20 DIAGNOSIS — J189 Pneumonia, unspecified organism: Secondary | ICD-10-CM | POA: Diagnosis present

## 2016-03-20 DIAGNOSIS — K5900 Constipation, unspecified: Secondary | ICD-10-CM | POA: Diagnosis present

## 2016-03-20 DIAGNOSIS — Z79899 Other long term (current) drug therapy: Secondary | ICD-10-CM | POA: Diagnosis not present

## 2016-03-20 DIAGNOSIS — I712 Thoracic aortic aneurysm, without rupture: Secondary | ICD-10-CM | POA: Diagnosis present

## 2016-03-20 DIAGNOSIS — Z87891 Personal history of nicotine dependence: Secondary | ICD-10-CM | POA: Diagnosis not present

## 2016-03-20 DIAGNOSIS — I509 Heart failure, unspecified: Secondary | ICD-10-CM | POA: Diagnosis present

## 2016-03-20 DIAGNOSIS — L89152 Pressure ulcer of sacral region, stage 2: Secondary | ICD-10-CM | POA: Diagnosis present

## 2016-03-20 DIAGNOSIS — N183 Chronic kidney disease, stage 3 (moderate): Secondary | ICD-10-CM | POA: Diagnosis present

## 2016-03-20 DIAGNOSIS — R627 Adult failure to thrive: Secondary | ICD-10-CM | POA: Diagnosis present

## 2016-03-20 DIAGNOSIS — R634 Abnormal weight loss: Secondary | ICD-10-CM | POA: Diagnosis present

## 2016-03-20 DIAGNOSIS — G8929 Other chronic pain: Secondary | ICD-10-CM | POA: Diagnosis present

## 2016-03-20 DIAGNOSIS — I4891 Unspecified atrial fibrillation: Secondary | ICD-10-CM | POA: Diagnosis present

## 2016-03-20 DIAGNOSIS — Z7901 Long term (current) use of anticoagulants: Secondary | ICD-10-CM | POA: Diagnosis not present

## 2016-03-20 DIAGNOSIS — Z800 Family history of malignant neoplasm of digestive organs: Secondary | ICD-10-CM | POA: Diagnosis not present

## 2016-03-20 DIAGNOSIS — M549 Dorsalgia, unspecified: Secondary | ICD-10-CM | POA: Diagnosis present

## 2016-03-20 DIAGNOSIS — E43 Unspecified severe protein-calorie malnutrition: Secondary | ICD-10-CM | POA: Insufficient documentation

## 2016-03-20 DIAGNOSIS — E785 Hyperlipidemia, unspecified: Secondary | ICD-10-CM | POA: Diagnosis present

## 2016-03-20 DIAGNOSIS — R531 Weakness: Secondary | ICD-10-CM | POA: Diagnosis present

## 2016-03-20 DIAGNOSIS — Z8261 Family history of arthritis: Secondary | ICD-10-CM | POA: Diagnosis not present

## 2016-03-20 DIAGNOSIS — I129 Hypertensive chronic kidney disease with stage 1 through stage 4 chronic kidney disease, or unspecified chronic kidney disease: Secondary | ICD-10-CM | POA: Diagnosis present

## 2016-03-20 HISTORY — DX: Pneumonia, unspecified organism: J18.9

## 2016-03-20 LAB — URINALYSIS COMPLETE WITH MICROSCOPIC (ARMC ONLY)
BILIRUBIN URINE: NEGATIVE
Glucose, UA: NEGATIVE mg/dL
Hgb urine dipstick: NEGATIVE
KETONES UR: NEGATIVE mg/dL
LEUKOCYTES UA: NEGATIVE
Nitrite: NEGATIVE
Protein, ur: NEGATIVE mg/dL
Specific Gravity, Urine: 1.015 (ref 1.005–1.030)
pH: 5 (ref 5.0–8.0)

## 2016-03-20 LAB — TSH: TSH: 2.745 u[IU]/mL (ref 0.350–4.500)

## 2016-03-20 MED ORDER — ONDANSETRON HCL 4 MG PO TABS: 4.0000 mg | ORAL_TABLET | Freq: Four times a day (QID) | ORAL | Status: DC | PRN

## 2016-03-20 MED ORDER — ENSURE ENLIVE PO LIQD
237.0000 mL | Freq: Three times a day (TID) | ORAL | Status: DC
  Administered 2016-03-20 – 2016-03-22 (×7): 237 mL via ORAL

## 2016-03-20 MED ORDER — POTASSIUM CHLORIDE CRYS ER 20 MEQ PO TBCR
40.0000 meq | EXTENDED_RELEASE_TABLET | Freq: Once | ORAL | Status: AC
  Administered 2016-03-20: 40 meq via ORAL
  Filled 2016-03-20: qty 2

## 2016-03-20 MED ORDER — DILTIAZEM HCL ER COATED BEADS 180 MG PO CP24
180.0000 mg | ORAL_CAPSULE | Freq: Every day | ORAL | Status: DC
  Administered 2016-03-20 – 2016-03-22 (×3): 180 mg via ORAL
  Filled 2016-03-20 (×3): qty 1

## 2016-03-20 MED ORDER — GADOBENATE DIMEGLUMINE 529 MG/ML IV SOLN
10.0000 mL | Freq: Once | INTRAVENOUS | Status: AC | PRN
  Administered 2016-03-20: 10 mL via INTRAVENOUS

## 2016-03-20 MED ORDER — OXYCODONE-ACETAMINOPHEN 5-325 MG PO TABS: 1.0000 | ORAL_TABLET | Freq: Four times a day (QID) | ORAL | Status: DC | PRN

## 2016-03-20 MED ORDER — TORSEMIDE 20 MG PO TABS
20.0000 mg | ORAL_TABLET | Freq: Every day | ORAL | Status: DC
  Administered 2016-03-20 – 2016-03-22 (×3): 20 mg via ORAL
  Filled 2016-03-20 (×3): qty 1

## 2016-03-20 MED ORDER — ENSURE ENLIVE PO LIQD: 237.0000 mL | Freq: Three times a day (TID) | ORAL | Status: DC

## 2016-03-20 MED ORDER — ACETAMINOPHEN 325 MG PO TABS
650.0000 mg | ORAL_TABLET | Freq: Four times a day (QID) | ORAL | Status: DC | PRN
Start: 2016-03-20 — End: 2016-03-22

## 2016-03-20 MED ORDER — POTASSIUM CHLORIDE IN NACL 40-0.9 MEQ/L-% IV SOLN
INTRAVENOUS | Status: DC
  Administered 2016-03-20 – 2016-03-21 (×4): 100 mL/h via INTRAVENOUS
  Filled 2016-03-20 (×6): qty 1000

## 2016-03-20 MED ORDER — DOCUSATE SODIUM 100 MG PO CAPS
100.0000 mg | ORAL_CAPSULE | Freq: Two times a day (BID) | ORAL | Status: DC
  Administered 2016-03-20 – 2016-03-22 (×6): 100 mg via ORAL
  Filled 2016-03-20 (×7): qty 1

## 2016-03-20 MED ORDER — ALBUTEROL SULFATE (2.5 MG/3ML) 0.083% IN NEBU: 3.0000 mL | INHALATION_SOLUTION | Freq: Four times a day (QID) | RESPIRATORY_TRACT | Status: DC | PRN

## 2016-03-20 MED ORDER — SODIUM CHLORIDE 0.9% FLUSH
3.0000 mL | Freq: Two times a day (BID) | INTRAVENOUS | Status: DC
  Administered 2016-03-20 – 2016-03-21 (×4): 3 mL via INTRAVENOUS

## 2016-03-20 MED ORDER — ACETAMINOPHEN 650 MG RE SUPP: 650.0000 mg | Freq: Four times a day (QID) | RECTAL | Status: DC | PRN

## 2016-03-20 MED ORDER — METOPROLOL TARTRATE 50 MG PO TABS
50.0000 mg | ORAL_TABLET | Freq: Two times a day (BID) | ORAL | Status: DC
  Administered 2016-03-20 – 2016-03-22 (×6): 50 mg via ORAL
  Filled 2016-03-20 (×6): qty 1

## 2016-03-20 MED ORDER — ONDANSETRON HCL 4 MG/2ML IJ SOLN: 4.0000 mg | Freq: Four times a day (QID) | INTRAMUSCULAR | Status: DC | PRN

## 2016-03-20 MED ORDER — LEVOFLOXACIN IN D5W 750 MG/150ML IV SOLN
750.0000 mg | Freq: Once | INTRAVENOUS | Status: AC
  Administered 2016-03-20: 750 mg via INTRAVENOUS
  Filled 2016-03-20: qty 150

## 2016-03-20 MED ORDER — DILTIAZEM HCL 25 MG/5ML IV SOLN
10.0000 mg | Freq: Once | INTRAVENOUS | Status: AC
  Administered 2016-03-20: 10 mg via INTRAVENOUS
  Filled 2016-03-20: qty 5

## 2016-03-20 MED ORDER — APIXABAN 5 MG PO TABS
5.0000 mg | ORAL_TABLET | Freq: Two times a day (BID) | ORAL | Status: DC
  Administered 2016-03-20 – 2016-03-22 (×6): 5 mg via ORAL
  Filled 2016-03-20 (×6): qty 1

## 2016-03-20 MED ORDER — OMEGA-3-ACID ETHYL ESTERS 1 G PO CAPS
1.0000 g | ORAL_CAPSULE | Freq: Every day | ORAL | Status: DC
  Administered 2016-03-20 – 2016-03-22 (×3): 1 g via ORAL
  Filled 2016-03-20 (×3): qty 1

## 2016-03-20 MED ORDER — ADULT MULTIVITAMIN W/MINERALS CH
1.0000 | ORAL_TABLET | Freq: Every day | ORAL | Status: DC
  Administered 2016-03-20 – 2016-03-21 (×2): 1 via ORAL
  Filled 2016-03-20 (×3): qty 1

## 2016-03-20 MED ORDER — LEVOFLOXACIN IN D5W 750 MG/150ML IV SOLN
750.0000 mg | INTRAVENOUS | Status: DC
  Filled 2016-03-20: qty 150

## 2016-03-20 MED ORDER — MORPHINE SULFATE (PF) 2 MG/ML IV SOLN: 2.0000 mg | INTRAVENOUS | Status: DC | PRN

## 2016-03-20 NOTE — Care Management (Addendum)
Spoke with Tim from [place] and agency is aware of patient but he will be "a new start"  Discussed PT OT and SW needs

## 2016-03-20 NOTE — Progress Notes (Signed)
[facility] [place] NAME: Patrick Harding    MR#:  [id number]  DATE OF BIRTH:  1934-08-28  SUBJECTIVE:  CHIEF COMPLAINT:  Patient is feeling okay shortness of breath is better but asking ensure as he is feeling weak. Denies any chest pain or abdominal pain. Reporting cough  REVIEW OF SYSTEMS:  CONSTITUTIONAL: No fever, fatigue or weakness.  EYES: No blurred or double vision.  EARS, NOSE, AND THROAT: No tinnitus or ear pain.  RESPIRATORY: has cough, denies any shortness of breath, wheezing or hemoptysis.  CARDIOVASCULAR: No chest pain, orthopnea, edema.  GASTROINTESTINAL: No nausea, vomiting, diarrhea or abdominal pain.  GENITOURINARY: No dysuria, hematuria.  ENDOCRINE: No polyuria, nocturia,  HEMATOLOGY: No anemia, easy bruising or bleeding SKIN: No rash or lesion. MUSCULOSKELETAL: No joint pain or arthritis.   NEUROLOGIC: No tingling, numbness, weakness.  PSYCHIATRY: No anxiety or depression.   DRUG ALLERGIES:   Allergies  Allergen Reactions  . Aspirin Itching  . Penicillins Hives, Itching and Other (See Comments)    Has patient had a PCN reaction causing immediate rash, facial/tongue/throat swelling, SOB or lightheadedness with hypotension: No Has patient had a PCN reaction causing severe rash involving mucus membranes or skin necrosis: No Has patient had a PCN reaction that required hospitalization No Has patient had a PCN reaction occurring within the last 10 years: No If all of the above answers are "NO", then may proceed with Cephalosporin use.    VITALS:  Blood pressure 101/63, pulse 97, temperature 97.7 F (36.5 C), temperature source Oral, resp. rate 18, height '6\' 1"'$  (1.854 m), weight 56.7 kg (125 lb), SpO2 100 %.  PHYSICAL EXAMINATION:  GENERAL:  82 y.o.-year-old patient lying in the bed with no acute distress.  EYES: Pupils equal, round, reactive to light and accommodation. No scleral icterus. Extraocular muscles  intact.  HEENT: Head atraumatic, normocephalic. Oropharynx and nasopharynx clear.  NECK:  Supple, no jugular venous distention. No thyroid enlargement, no tenderness.  LUNGS: Moderate breath sounds bilaterally, no wheezing, rales,rhonchi or crepitation. No use of accessory muscles of respiration.  CARDIOVASCULAR: S1, S2 normal. No murmurs, rubs, or gallops.  ABDOMEN: Soft, nontender, nondistended. Bowel sounds present. No organomegaly or mass.  EXTREMITIES: No pedal edema, cyanosis, or clubbing.  NEUROLOGIC: Cranial nerves II through XII are intact. Muscle strength 5/5 in all extremities. Sensation intact. Gait not checked.  PSYCHIATRIC: The patient is alert and oriented x 3.  SKIN: No obvious rash, lesion, or ulcer.    LABORATORY PANEL:   CBC  Recent Labs Lab 03/19/16 2042  WBC 14.9*  HGB 12.3*  HCT 36.5*  PLT 225   ------------------------------------------------------------------------------------------------------------------  Chemistries   Recent Labs Lab 03/19/16 2042  NA 134*  K 3.4*  CL 99*  CO2 27  GLUCOSE 117*  BUN 37*  CREATININE 1.31*  CALCIUM 9.3   ------------------------------------------------------------------------------------------------------------------  Cardiac Enzymes  Recent Labs Lab 03/19/16 2042  TROPONINI 0.03   ------------------------------------------------------------------------------------------------------------------  RADIOLOGY:  Dg Chest 2 View  03/19/2016  CLINICAL DATA:  Cough for 3 weeks.  Weakness. EXAM: CHEST  2 VIEW COMPARISON:  February 17, 2016 FINDINGS: There is no edema or consolidation. There is a nodular opacity in the right upper lobe measuring 9 x 6 mm. Heart size and pulmonary vascularity are normal. No adenopathy. There is atherosclerotic change in the aorta. No adenopathy. IMPRESSION: 9 x 6 mm nodular opacity right upper lobe. Advise noncontrast enhanced chest CT to further assess. No edema or consolidation.  Electronically Signed   By: Lowella Grip III M.D.   On: 03/19/2016 22:31   Ct Chest Wo Contrast  03/20/2016  CLINICAL DATA:  Acute onset of hypotension and generalized weakness. Decreased oral intake. Cough. Evaluate nodule noted on chest radiograph. Initial encounter. EXAM: CT CHEST WITHOUT CONTRAST TECHNIQUE: Multidetector CT imaging of the chest was performed following the standard protocol without IV contrast. COMPARISON:  Chest radiograph performed earlier today at 10:20 p.m. FINDINGS: Patchy nodular airspace opacities are noted within the right upper and middle lobes, and minimally within the right lower lobe, compatible with multifocal pneumonia, possibly atypical in nature. Minimal left basilar atelectasis and scarring is noted. No pleural effusion or pneumothorax is seen. No dominant masses are identified. Diffuse coronary artery calcifications are seen. There is aneurysmal dilatation of the ascending thoracic aorta to 4.9 cm in AP dimension, and aneurysmal dilatation of the descending thoracic aorta to 4.2 cm in AP dimension. No pericardial effusion is identified. No mediastinal lymphadenopathy is seen. The great vessels are grossly unremarkable. The visualized portions of thyroid gland are unremarkable. No axillary lymphadenopathy is seen. The visualized portions of the liver and spleen are grossly unremarkable in appearance. The visualized portions of the gallbladder, pancreas, adrenal glands and kidneys are within normal limits. No acute osseous abnormalities are identified. IMPRESSION: 1. Patchy nodular airspace opacities within the right middle and upper lung lobes, and minimally within the right lower lung lobe, compatible with multifocal pneumonia, possibly atypical in nature. This corresponds to the finding on chest radiograph. 2. Minimal left basilar atelectasis and scarring noted. 3. Diffuse coronary artery calcifications seen. 4. Aneurysmal dilatation of the ascending thoracic aorta to  4.9 cm in AP dimension, and at the descending thoracic aorta to 4.2 cm in AP dimension. Recommend semi-annual imaging followup by CTA or MRA and referral to cardiothoracic surgery if not already obtained. This recommendation follows 2010 ACCF/AHA/AATS/ACR/ASA/SCA/SCAI/SIR/STS/SVM Guidelines for the Diagnosis and Management of Patients With Thoracic Aortic Disease. Circulation. 2010; 121: B510-C585 Electronically Signed   By: Garald Balding M.D.   On: 03/20/2016 00:40    EKG:   Orders placed or performed during the hospital encounter of 03/19/16  . ED EKG  . ED EKG  . EKG 12-Lead  . EKG 12-Lead    ASSESSMENT AND PLAN:   This is a 82 year old male admitted for atrial fibrillation with rapid ventricular response as well as multifocal pneumonia.  # . Multifocal pneumonia: We will provide supplements and as needed basis. Continue levofloxacin and follow up on the sputum culture and sensitivity    #Atrial fibrillation: Rapid ventricular response controlled with Cardizem.  continue to monitor on telemetry. Continue Eliquis  #Ascending aneurysmal dilatation of 4.9 cm -patient is asymptomatic. Vascular surgery consult is placed   # unintentional weight loss: May be secondary to recent tooth extraction but this seems dramatic. The patient has not had a colonoscopy. CEA is elevated. No colonic, prostate or liver lesions seen on CT scan.  Dietary consult. Provide nutrition supplements. GI consult  #. Chronic kidney disease: Stage III; avoid nephrotoxic agents. Hydrate with intravenous fluid  #. Essential hypertension: Continue metoprolol and diltiazem  #. Right-sided weakness: Mostly right lower extremity. The patient states that this is an acute on chronic problem. He is anticoagulated but risk of stroke still exists. He was previously scheduled for an outpatient MRI of the brain which we will obtained today   8. Case management consult: The patient's wife has been his primary caregiver but she  is  now unable to help with transfers due to recent back surgery. He has physical therapy as an outpatient that has been unable to obtain durable medical equipment for help at home.  9. DVT prophylaxis: As above  10. GI prophylaxis: None     All the records are reviewed and case discussed with Care Management/Social Workerr. Management plans discussed with the patient, family and they are in agreement.  CODE STATUS: fc  TOTAL TIME TAKING CARE OF THIS PATIENT: 35  minutes.   POSSIBLE D/C IN 2-3  DAYS, DEPENDING ON CLINICAL CONDITION.   Nicholes Mango M.D on 03/20/2016 at 4:08 PM  Between 7am to 6pm - Pager - [phone] After 6pm go to [email or web address] - password [email or web address] [facility]  Office  [phone]  CC: Primary care physician; Casilda Carls, MD

## 2016-03-20 NOTE — Evaluation (Signed)
Physical Therapy Evaluation Patient Details Name: Patrick Harding MRN: [id number] DOB: [birth year] Today's Date: 03/20/2016   History of Present Illness  presented to ER with generalized weakness; admitted with afib with RVR (managed with cardizem) and multifocal PNA.  Clinical Impression  Upon evaluation, patient alert and oriented; follows all commands and demonstrates fair insight/safety awareness.  Bilat UE strength and ROM grossly WFL and symmetrical; R > L LE globally weakened, strength at least 2+ to 3-/5 in isolation.  Currently requiring min assist for bed mobility, cga/min assist for squat pivot transfers between surfaces; non-ambulatory at baseline.  May benefit from transfer training over surfaces with removable armrests to maximize safety/indep in subsequent sessions. Would benefit from skilled PT to address above deficits and promote optimal return to PLOF; Recommend transition to [facility] [place] upon discharge from acute hospitalization.     Follow Up Recommendations Home health PT;Supervision/Assistance - 24 hour    Equipment Recommendations   (manual WC (16"W x 18"D, standard height, swing-away armrests, anti-tip bars, ELRS, pull to lock brakes and pressure-relieving cushion); DABSC)    Recommendations for Other Services  (anticipate need for EMS transport home due to steps to enter/exit home)     Precautions / Restrictions Precautions Precautions: Fall Restrictions Weight Bearing Restrictions: No      Mobility  Bed Mobility Overal bed mobility: Needs Assistance Bed Mobility: Supine to Sit     Supine to sit: Min assist        Transfers Overall transfer level: Needs assistance   Transfers: Squat Pivot Transfers     Squat pivot transfers: Min guard;Min assist     General transfer comment: good awareness of hand placement and technique; heavy use of bilat UEs to complete  Ambulation/Gait             General Gait Details: non-ambulatory at baseline (3-4  months)  Stairs            Wheelchair Mobility    Modified Rankin (Stroke Patients Only)       Balance Overall balance assessment: Needs assistance Sitting-balance support: No upper extremity supported;Feet supported Sitting balance-Leahy Scale: Good                                       Pertinent Vitals/Pain Pain Assessment: No/denies pain    Home Living Family/patient expects to be discharged to:: Private residence Living Arrangements: Spouse/significant other Available Help at Discharge: Family;Available 24 hours/day Type of Home: House Home Access: Stairs to enter Entrance Stairs-Rails: None Entrance Stairs-Number of Steps: 3 Home Layout: Multi-level;Laundry or work area in basement;Able to live on main level with bedroom/bathroom Home Equipment:  (manual WC that belonged to mother in [employer])      Prior Function Level of Independence: Needs assistance         Comments: Wife assists with ADLs (sponge bath) and basic transfers (WC level) as able, but is limited in ability due to recent back surgery per patient report     Hand Dominance        Extremity/Trunk Assessment   Upper Extremity Assessment: Overall WFL for tasks assessed           Lower Extremity Assessment: Generalized weakness (R LE grossly 2+/5, L LE grossly 3-/5)         Communication   Communication: No difficulties  Cognition Arousal/Alertness: Awake/alert Behavior During Therapy: WFL for tasks assessed/performed Overall Cognitive Status: Within Functional Limits  for tasks assessed                      General Comments      Exercises        Assessment/Plan    PT Assessment Patient needs continued PT services  PT Diagnosis Difficulty walking;Generalized weakness   PT Problem List Decreased strength;Decreased safety awareness;Decreased balance;Decreased mobility;Decreased knowledge of use of DME;Decreased knowledge of precautions;Cardiopulmonary  status limiting activity  PT Treatment Interventions DME instruction;Gait training;Functional mobility training;Therapeutic activities;Therapeutic exercise;Balance training;Patient/family education   PT Goals (Current goals can be found in the Care Plan section) Acute Rehab PT Goals Patient Stated Goal: to return home PT Goal Formulation: With patient Time For Goal Achievement: 04/03/16 Potential to Achieve Goals: Good    Frequency Min 2X/week   Barriers to discharge Decreased caregiver support      Co-evaluation               End of Session Equipment Utilized During Treatment: Gait belt Activity Tolerance: Patient tolerated treatment well Patient left: in chair;with call bell/phone within reach;with chair alarm set;with nursing/sitter in room Nurse Communication: Mobility status         Time: 3010-4045 PT Time Calculation (min) (ACUTE ONLY): 16 min   Charges:   PT Evaluation $PT Eval Low Complexity: 1 Procedure     PT G Codes:        Oaklie Durrett H. Owens Shark, PT, DPT, NCS 03/20/2016, 4:25 PM [phone]

## 2016-03-20 NOTE — H&P (Signed)
Patrick Harding is an 82 y.o. male.   Chief Complaint: Weakness HPI: The patient with past medical history significant for atrial fibrillation, chronic back pain, and essential hypertension presents to the emergency department complaining of weakness. He states that he attempted to be seen in the emergency department earlier today but went home thinking that he may have been feeling better. However later in the evening he was unable to transfer and was feeling generally fatigued which prompted him to return for evaluation. In the emergency department he was found to have atrial fibrillation with rapid ventricular response. He responded well to Cardizem. Chest x-ray showed some nodular densities which prompted CT of the chest that demonstrated multifocal pneumonia in the right lung. Notably, the patient states that he has lost proximally 30 pounds in the last month. He also admits to right sided weakness. Due to his overall debility the emergency department staff called for admission.  Past Medical History  Diagnosis Date  . Hyperlipidemia   . Anemia   . Vitamin D deficiency   . Hypertension   . Renal insufficiency   . Erectile dysfunction   . Arrhythmia   . Atrial fibrillation [facility])     Past Surgical History  Procedure Laterality Date  . Head surgery Left     Family History  Problem Relation Age of Onset  . Arthritis Father    Social History:  reports that he quit smoking about 4 months ago. His smoking use included Cigarettes. He has a 10 pack-year smoking history. He does not have any smokeless tobacco history on file. He reports that he does not drink alcohol or use illicit drugs.  Allergies:  Allergies  Allergen Reactions  . Aspirin Itching  . Penicillins Hives, Itching and Other (See Comments)    Has patient had a PCN reaction causing immediate rash, facial/tongue/throat swelling, SOB or lightheadedness with hypotension: No Has patient had a PCN reaction causing severe rash involving  mucus membranes or skin necrosis: No Has patient had a PCN reaction that required hospitalization No Has patient had a PCN reaction occurring within the last 10 years: No If all of the above answers are "NO", then may proceed with Cephalosporin use.    Medications Prior to Admission  Medication Sig Dispense Refill  . acetaminophen (TYLENOL) 500 MG tablet Take 1,000 mg by mouth every 6 (six) hours as needed for mild pain or headache.    . albuterol (PROVENTIL HFA;VENTOLIN HFA) 108 (90 Base) MCG/ACT inhaler Inhale 2 puffs into the lungs every 6 (six) hours as needed for wheezing or shortness of breath. 1 Inhaler 2  . apixaban (ELIQUIS) 5 MG TABS tablet Take 1 tablet (5 mg total) by mouth 2 (two) times daily. 60 tablet 3  . diltiazem (CARDIZEM CD) 180 MG 24 hr capsule Take 180 mg by mouth daily.    . metoprolol (LOPRESSOR) 50 MG tablet Take 1 tablet (50 mg total) by mouth 2 (two) times daily. 60 tablet 2  . Multiple Vitamin (MULTIVITAMIN WITH MINERALS) TABS tablet Take 1 tablet by mouth daily.    Marland Kitchen omega-3 acid ethyl esters (LOVAZA) 1 g capsule Take 1 g by mouth daily. Reported on 03/12/2016    . torsemide (DEMADEX) 20 MG tablet Take 20 mg by mouth daily.      Results for orders placed or performed during the hospital encounter of 03/19/16 (from the past 48 hour(s))  CBC with Differential     Status: Abnormal   Collection Time: 03/19/16  8:42 PM  Result Value Ref Range   WBC 14.9 (H) 3.8 - 10.6 K/uL   RBC 4.26 (L) 4.40 - 5.90 MIL/uL   Hemoglobin 12.3 (L) 13.0 - 18.0 g/dL   HCT 36.5 (L) 40.0 - 52.0 %   MCV 85.5 80.0 - 100.0 fL   MCH 28.9 26.0 - 34.0 pg   MCHC 33.8 32.0 - 36.0 g/dL   RDW 15.0 (H) 11.5 - 14.5 %   Platelets 225 150 - 440 K/uL   Neutrophils Relative % 72 %   Neutro Abs 10.6 (H) 1.4 - 6.5 K/uL   Lymphocytes Relative 18 %   Lymphs Abs 2.7 1.0 - 3.6 K/uL   Monocytes Relative 8 %   Monocytes Absolute 1.2 (H) 0.2 - 1.0 K/uL   Eosinophils Relative 2 %   Eosinophils Absolute 0.3  0 - 0.7 K/uL   Basophils Relative 0 %   Basophils Absolute 0.0 0 - 0.1 K/uL  Basic metabolic panel     Status: Abnormal   Collection Time: 03/19/16  8:42 PM  Result Value Ref Range   Sodium 134 (L) 135 - 145 mmol/L   Potassium 3.4 (L) 3.5 - 5.1 mmol/L   Chloride 99 (L) 101 - 111 mmol/L   CO2 27 22 - 32 mmol/L   Glucose, Bld 117 (H) 65 - 99 mg/dL   BUN 37 (H) 6 - 20 mg/dL   Creatinine, Ser 1.31 (H) 0.61 - 1.24 mg/dL   Calcium 9.3 8.9 - 10.3 mg/dL   GFR calc non Af Amer 50 (L) >60 mL/min   GFR calc Af Amer 58 (L) >60 mL/min    Comment: (NOTE) The eGFR has been calculated using the CKD EPI equation. This calculation has not been validated in all clinical situations. eGFR's persistently <60 mL/min signify possible Chronic Kidney Disease.    Anion gap 8 5 - 15  Troponin I     Status: None   Collection Time: 03/19/16  8:42 PM  Result Value Ref Range   Troponin I 0.03 <0.031 ng/mL    Comment:        NO INDICATION OF MYOCARDIAL INJURY.   Lactic acid, plasma     Status: None   Collection Time: 03/19/16 10:04 PM  Result Value Ref Range   Lactic Acid, Venous 1.4 0.5 - 2.0 mmol/L  Urinalysis complete, with microscopic ([facility] only)     Status: Abnormal   Collection Time: 03/20/16 12:29 AM  Result Value Ref Range   Color, Urine YELLOW (A) YELLOW   APPearance CLEAR (A) CLEAR   Glucose, UA NEGATIVE NEGATIVE mg/dL   Bilirubin Urine NEGATIVE NEGATIVE   Ketones, ur NEGATIVE NEGATIVE mg/dL   Specific Gravity, Urine 1.015 1.005 - 1.030   Hgb urine dipstick NEGATIVE NEGATIVE   pH 5.0 5.0 - 8.0   Protein, ur NEGATIVE NEGATIVE mg/dL   Nitrite NEGATIVE NEGATIVE   Leukocytes, UA NEGATIVE NEGATIVE   RBC / HPF 0-5 0 - 5 RBC/hpf   WBC, UA 0-5 0 - 5 WBC/hpf   Bacteria, UA RARE (A) NONE SEEN   Squamous Epithelial / LPF 0-5 (A) NONE SEEN   Mucous PRESENT    Hyaline Casts, UA PRESENT    Dg Chest 2 View  03/19/2016  CLINICAL DATA:  Cough for 3 weeks.  Weakness. EXAM: CHEST  2 VIEW COMPARISON:   February 17, 2016 FINDINGS: There is no edema or consolidation. There is a nodular opacity in the right upper lobe measuring 9 x 6 mm. Heart size and pulmonary  vascularity are normal. No adenopathy. There is atherosclerotic change in the aorta. No adenopathy. IMPRESSION: 9 x 6 mm nodular opacity right upper lobe. Advise noncontrast enhanced chest CT to further assess. No edema or consolidation. Electronically Signed   By: Lowella Grip III M.D.   On: 03/19/2016 22:31   Ct Chest Wo Contrast  03/20/2016  CLINICAL DATA:  Acute onset of hypotension and generalized weakness. Decreased oral intake. Cough. Evaluate nodule noted on chest radiograph. Initial encounter. EXAM: CT CHEST WITHOUT CONTRAST TECHNIQUE: Multidetector CT imaging of the chest was performed following the standard protocol without IV contrast. COMPARISON:  Chest radiograph performed earlier today at 10:20 p.m. FINDINGS: Patchy nodular airspace opacities are noted within the right upper and middle lobes, and minimally within the right lower lobe, compatible with multifocal pneumonia, possibly atypical in nature. Minimal left basilar atelectasis and scarring is noted. No pleural effusion or pneumothorax is seen. No dominant masses are identified. Diffuse coronary artery calcifications are seen. There is aneurysmal dilatation of the ascending thoracic aorta to 4.9 cm in AP dimension, and aneurysmal dilatation of the descending thoracic aorta to 4.2 cm in AP dimension. No pericardial effusion is identified. No mediastinal lymphadenopathy is seen. The great vessels are grossly unremarkable. The visualized portions of thyroid gland are unremarkable. No axillary lymphadenopathy is seen. The visualized portions of the liver and spleen are grossly unremarkable in appearance. The visualized portions of the gallbladder, pancreas, adrenal glands and kidneys are within normal limits. No acute osseous abnormalities are identified. IMPRESSION: 1. Patchy nodular  airspace opacities within the right middle and upper lung lobes, and minimally within the right lower lung lobe, compatible with multifocal pneumonia, possibly atypical in nature. This corresponds to the finding on chest radiograph. 2. Minimal left basilar atelectasis and scarring noted. 3. Diffuse coronary artery calcifications seen. 4. Aneurysmal dilatation of the ascending thoracic aorta to 4.9 cm in AP dimension, and at the descending thoracic aorta to 4.2 cm in AP dimension. Recommend semi-annual imaging followup by CTA or MRA and referral to cardiothoracic surgery if not already obtained. This recommendation follows 2010 ACCF/AHA/AATS/ACR/ASA/SCA/SCAI/SIR/STS/SVM Guidelines for the Diagnosis and Management of Patients With Thoracic Aortic Disease. Circulation. 2010; 121: H607-P710 Electronically Signed   By: Garald Balding M.D.   On: 03/20/2016 00:40    Review of Systems  Constitutional: Positive for chills. Negative for fever.  HENT: Negative for sore throat and tinnitus.   Eyes: Negative for blurred vision and redness.  Respiratory: Positive for cough and sputum production (white). Negative for shortness of breath.   Cardiovascular: Negative for chest pain, palpitations, orthopnea and PND.  Gastrointestinal: Negative for nausea, vomiting, abdominal pain and diarrhea.  Genitourinary: Negative for dysuria, urgency and frequency.  Musculoskeletal: Negative for myalgias and joint pain.  Skin: Negative for rash.       No lesions  Neurological: Positive for focal weakness (right leg) and weakness. Negative for speech change.  Endo/Heme/Allergies: Does not bruise/bleed easily.       No temperature intolerance  Psychiatric/Behavioral: Negative for depression and suicidal ideas.    Blood pressure 113/51, pulse 53, temperature 97.7 F (36.5 C), temperature source Oral, resp. rate 17, height '6\' 1"'$  (1.854 m), weight 54.885 kg (121 lb), SpO2 100 %. Physical Exam  Constitutional: He appears  well-developed and well-nourished. No distress.  HENT:  Head: Normocephalic and atraumatic.  Mouth/Throat: Oropharynx is clear and moist.  Eyes: Conjunctivae and EOM are normal. Pupils are equal, round, and reactive to light. No scleral icterus.  Neck: Normal range of motion. Neck supple. No JVD present. No tracheal deviation present. No thyromegaly present.  Cardiovascular: Normal rate, regular rhythm and normal heart sounds.  Exam reveals no gallop and no friction rub.   No murmur heard. Respiratory: Effort normal and breath sounds normal. No respiratory distress.  GI: Soft. Bowel sounds are normal. He exhibits no distension. There is no tenderness.  Genitourinary:  Deferred  Lymphadenopathy:    He has no cervical adenopathy.     Assessment/Plan This is a 82 year old male admitted for atrial fibrillation with rapid ventricular response as well as multifocal pneumonia. 1. Atrial fibrillation: Rapid ventricular response controlled with Cardizem. The patient is also not received his nighttime meds including metoprolol. The latter will slow his heart rate even more which we will continue to monitor on telemetry. Continue Eliquis 2. Multifocal pneumonia: The patient admits that he is not been very mobile. He denies shortness of breath but admits to mildly productive cough. He has been started on Levaquin. Oxygen saturations are normal on room air (supplement oxygen for comfort). 3. Chronic kidney disease: Stage III; avoid nephrotoxic agents. Hydrate with intravenous fluid 4. Essential hypertension: Continue metoprolol and diltiazem 5. Right-sided weakness: Mostly right lower extremity. The patient states that this is an acute on chronic problem. He is anticoagulated but risk of stroke still exists. He was previously scheduled for an outpatient MRI of the brain which we will obtain in the morning. 6. Weight loss: May be secondary to recent tooth extraction but this seems dramatic. The patient has  not had a colonoscopy. CEA is elevated. No colonic, prostate or liver lesions seen on CT scan. Consider gastroenterology consult while patient is hospitalized 7. Aortic aneurysm: 4.9 cm; no chest pain. Manage hypertension 8. Case management consult: The patient's wife has been his primary caregiver but she is now unable to help with transfers due to recent back surgery. He has physical therapy as an outpatient that has been unable to obtain durable medical equipment for help at home. 9. DVT prophylaxis: As above 10. GI prophylaxis: None The patient is a full code. Time spent on admission orders and patient care possibly 45 minutes  Harrie Foreman, MD 03/20/2016, 4:17 AM

## 2016-03-20 NOTE — Progress Notes (Signed)
Initial Nutrition Assessment  DOCUMENTATION CODES:   Severe malnutrition in context of acute illness/injury, Underweight  INTERVENTION:  -Monitor intake. Recommend liberalizing diet to regular secondary to malnutrition -Recommend Ensure Enlive po TID, each supplement provides 350 kcal and 20 grams of protein    NUTRITION DIAGNOSIS:   Inadequate oral intake related to acute illness, mouth pain as evidenced by per patient/family report, severe depletion of body fat, severe depletion of muscle mass.    GOAL:   Patient will meet greater than or equal to 90% of their needs    MONITOR:   PO intake, Supplement acceptance  REASON FOR ASSESSMENT:    (BMI 16)    ASSESSMENT:      Past Medical History  Diagnosis Date  . Hyperlipidemia   . Anemia   . Vitamin D deficiency   . Hypertension   . Renal insufficiency   . Erectile dysfunction   . Arrhythmia   . Atrial fibrillation ([place])      Pt reports appetite has been poor for at least that last 30 days secondary to 10 teeth being pulled and gums have not healed from that.  Did not eat much breakfast as did not like egg whites and grits.    Medications reviewed: colace, MVI, omega 3, NS with KCl at 130m/hr Labs reviewed: Na 134, K 3.4, BUN 37, creatinine 1.31, glucose 115, WBC 14.9, Hgb 12.3  Nutrition-Focused physical exam completed. Findings are moderate to severe fat depletion, moderate to severe muscle depletion, and none edema.    Diet Order:  Diet regular Room service appropriate?: Yes; Fluid consistency:: Thin  Skin:  Reviewed, no issues  Last BM:  03/19/2016   Height:   Ht Readings from Last 1 Encounters:  03/19/16 '6\' 1"'$  (1.854 m)    Weight: Pt reports 30 pound wt loss in the last month.  Per wt encounters noted 10 pound wt loss in the last 2 months (7% wt loss in the last 2 months)  Wt Readings from Last 1 Encounters:  03/20/16 125 lb (56.7 kg)   Wt Readings from Last 10 Encounters:  03/20/16 125 lb  (56.7 kg)  03/12/16 131 lb (59.421 kg)  02/17/16 131 lb (59.421 kg)  01/24/16 135 lb (61.236 kg)  12/30/15 136 lb (61.689 kg)  12/06/15 144 lb (65.318 kg)  12/06/15 145 lb 8 oz (65.998 kg)  08/10/15 151 lb (68.493 kg)  08/04/15 148 lb 12 oz (67.473 kg)  05/06/15 153 lb (69.4 kg)     Ideal Body Weight:     BMI:  Body mass index is 16.5 kg/(m^2).  Estimated Nutritional Needs:   Kcal:  1960 kcals/d.  Protein:  67-84 g/d  Fluid:  2 L/d  EDUCATION NEEDS:   No education needs identified at this time  Adelyne Marchese B. AZenia Resides RLa Porte LBlackburn(pager) Weekend/On-Call pager [phone]

## 2016-03-20 NOTE — Progress Notes (Signed)
OT Cancellation Note  Patient Details Name: Patrick Harding MRN: [id number] DOB: Apr 11, 1934   Cancelled Treatment:    Reason Eval/Treat Not Completed: Patient at procedure or test/ unavailable  [name], MS, OTR/L  [name] 03/20/2016, 3:24 PM

## 2016-03-20 NOTE — Progress Notes (Signed)
Pharmacy Antibiotic Follow-up Note  Patrick Harding is a 82 y.o. year-old male admitted on 03/19/2016.  The patient is currently on day 1 of levofloxacin for CAP.  Assessment/Plan: Lab called to report positive blood culture. BCID identified GPC in clusters, staph sp. undifferentiated in aerobic bottle of 1 set. Patient is on levofloxacin which will cover MSSA, CoNS, lugdunensis and saprophyticus. Paged on call provider x 1 to recommend no change at this time.  Temp (24hrs), Avg:97.8 F (36.6 C), Min:97.7 F (36.5 C), Max:98 F (36.7 C)   Recent Labs Lab 03/19/16 2042  WBC 14.9*    Recent Labs Lab 03/19/16 2042  CREATININE 1.31*   Estimated Creatinine Clearance: 36.7 mL/min (by C-G formula based on Cr of 1.31).    Allergies  Allergen Reactions  . Aspirin Itching  . Penicillins Hives, Itching and Other (See Comments)    Has patient had a PCN reaction causing immediate rash, facial/tongue/throat swelling, SOB or lightheadedness with hypotension: No Has patient had a PCN reaction causing severe rash involving mucus membranes or skin necrosis: No Has patient had a PCN reaction that required hospitalization No Has patient had a PCN reaction occurring within the last 10 years: No If all of the above answers are "NO", then may proceed with Cephalosporin use.    Thank you for allowing pharmacy to be a part of this patient's care.  Laural Benes, Pharm.D., BCPS Clinical Pharmacist 03/20/2016 11:21 PM

## 2016-03-20 NOTE — ED Provider Notes (Signed)
-----------------------------------------   12:22 AM on 03/20/2016 -----------------------------------------  Assume care from Dr. Archie Balboa. CT pending. Heart rate noted to be between 110-120, A. fib with RVR. Will administer 10 mg IV Cardizem.  ----------------------------------------- 1:18 AM on 03/20/2016 -----------------------------------------  Cardizem given with good effect. Heart rate currently 85-100. Updated patient and family members of CT scan demonstrating multifocal pneumonia. Will obtain 2 sets of blood cultures and administer IV antibiotics for community-acquired pneumonia. Discuss with hospitalist to evaluate the patient in the emergency department for admission.  CT chest without contrast interpreted per Dr. Radene Knee: 1. Patchy nodular airspace opacities within the right middle and upper lung lobes, and minimally within the right lower lung lobe, compatible with multifocal pneumonia, possibly atypical in nature. This corresponds to the finding on chest radiograph. 2. Minimal left basilar atelectasis and scarring noted. 3. Diffuse coronary artery calcifications seen. 4. Aneurysmal dilatation of the ascending thoracic aorta to 4.9 cm in AP dimension, and at the descending thoracic aorta to 4.2 cm in AP dimension. Recommend semi-annual imaging followup by CTA or MRA and referral to cardiothoracic surgery if not already obtained. This recommendation follows 2010 ACCF/AHA/AATS/ACR/ASA/SCA/SCAI/SIR/STS/SVM Guidelines for the Diagnosis and Management of Patients With Thoracic Aortic Disease. Circulation. 2010; 121: G394-V200   Paulette Blanch, MD 03/20/16 [phone]

## 2016-03-20 NOTE — Care Management (Addendum)
Patients presents from home with increasing weakness that has been going on  for a couple of months.  Chronic a fib and on Eliquis as an outpatient. Received som IV cardizem.   No oxygen.  Lives with his wife who he says had back surgery recently and he has been trying to help her but he himself has become too weak.  Before two months ago he was "totally" independent in all his adls and driving- but then he says he "has been waiting to get a   a wheelchair and a tub bench for 7 months."  The wheelchair-" i do not want want on like the ones you have here- I want one of those smaller ones that are easy to handle. "  He has been trying to get a ramp  and called the churches "but they couldn't do it for at least 3 months."  Patient says he did not put his name on any of  the waiting lists.  It does not seem believable that patient was as independent as he says he was 2 months prior if he has been trying to get a wheelchair for the past seven months.  He does have a walker.  Says he is currently followed by [facility].  "They treated my upper half and they are getting orders for the lower half of my body."  They are the ones trying to get me the wheelchair and tub bench.  Contacted Tim with [facility] and confirmed patient is currently being followed. Have requested Tim call CM to discuss patient's dme concerns. Patient's PCP is Rosario Jacks and says he is current.  Denies issus accessing medical care,paying for meds or with transportation.  Patient must be able to navigate stairs to get inside his home.

## 2016-03-20 NOTE — Plan of Care (Signed)
Problem: Activity: Goal: Risk for activity intolerance will decrease Outcome: Not Progressing Patient has weakness in both legs.  Not ambulatory.

## 2016-03-20 NOTE — Progress Notes (Signed)
Pharmacy Antibiotic Note  Patrick Harding is a 82 y.o. male admitted on 03/19/2016 with pneumonia.  Pharmacy has been consulted for levofloxacin dosing.  Plan: Levofloxacin 750 mg IV Q48H.   Height: '6\' 1"'$  (185.4 cm) Weight: 121 lb (54.885 kg) IBW/kg (Calculated) : 79.9  Temp (24hrs), Avg:97.7 F (36.5 C), Min:97.7 F (36.5 C), Max:97.7 F (36.5 C)   Recent Labs Lab 03/19/16 2042 03/19/16 2204  WBC 14.9*  --   CREATININE 1.31*  --   LATICACIDVEN  --  1.4    Estimated Creatinine Clearance: 35.5 mL/min (by C-G formula based on Cr of 1.31).    Allergies  Allergen Reactions  . Aspirin Itching  . Penicillins Hives, Itching and Other (See Comments)    Has patient had a PCN reaction causing immediate rash, facial/tongue/throat swelling, SOB or lightheadedness with hypotension: No Has patient had a PCN reaction causing severe rash involving mucus membranes or skin necrosis: No Has patient had a PCN reaction that required hospitalization No Has patient had a PCN reaction occurring within the last 10 years: No If all of the above answers are "NO", then may proceed with Cephalosporin use.    Thank you for allowing pharmacy to be a part of this patient's care.  Laural Benes, Pharm.D., BCPS Clinical Pharmacist 03/20/2016 3:38 AM

## 2016-03-21 DIAGNOSIS — L8990 Pressure ulcer of unspecified site, unspecified stage: Secondary | ICD-10-CM | POA: Insufficient documentation

## 2016-03-21 HISTORY — DX: Pressure ulcer of unspecified site, unspecified stage: L89.90

## 2016-03-21 LAB — BASIC METABOLIC PANEL
Anion gap: 5 (ref 5–15)
BUN: 26 mg/dL — AB (ref 6–20)
CO2: 26 mmol/L (ref 22–32)
CREATININE: 0.93 mg/dL (ref 0.61–1.24)
Calcium: 9.3 mg/dL (ref 8.9–10.3)
Chloride: 108 mmol/L (ref 101–111)
GFR calc Af Amer: >60 mL/min (ref >60)
GLUCOSE: 90 mg/dL (ref 65–99)
POTASSIUM: 5.1 mmol/L (ref 3.5–5.1)
SODIUM: 139 mmol/L (ref 135–145)

## 2016-03-21 LAB — CBC
HCT: 33.4 % — ABNORMAL LOW (ref 40.0–52.0)
Hemoglobin: 11.5 g/dL — ABNORMAL LOW (ref 13.0–18.0)
MCH: 29.9 pg (ref 26.0–34.0)
MCHC: 34.5 g/dL (ref 32.0–36.0)
MCV: 86.7 fL (ref 80.0–100.0)
PLATELETS: 206 10*3/uL (ref 150–440)
RBC: 3.85 MIL/uL — ABNORMAL LOW (ref 4.40–5.90)
RDW: 15 % — AB (ref 11.5–14.5)
WBC: 11.2 10*3/uL — ABNORMAL HIGH (ref 3.8–10.6)

## 2016-03-21 LAB — BLOOD CULTURE ID PANEL (REFLEXED)
Acinetobacter baumannii: NOT DETECTED
CANDIDA ALBICANS: NOT DETECTED
CANDIDA KRUSEI: NOT DETECTED
CANDIDA PARAPSILOSIS: NOT DETECTED
CANDIDA TROPICALIS: NOT DETECTED
CARBAPENEM RESISTANCE: NOT DETECTED
Candida glabrata: NOT DETECTED
ENTEROBACTERIACEAE SPECIES: NOT DETECTED
ENTEROCOCCUS SPECIES: NOT DETECTED
Enterobacter cloacae complex: NOT DETECTED
Escherichia coli: NOT DETECTED
Haemophilus influenzae: NOT DETECTED
KLEBSIELLA OXYTOCA: NOT DETECTED
KLEBSIELLA PNEUMONIAE: NOT DETECTED
Listeria monocytogenes: NOT DETECTED
Methicillin resistance: NOT DETECTED
Neisseria meningitidis: NOT DETECTED
PROTEUS SPECIES: NOT DETECTED
PSEUDOMONAS AERUGINOSA: NOT DETECTED
STAPHYLOCOCCUS AUREUS BCID: NOT DETECTED
STAPHYLOCOCCUS SPECIES: DETECTED — AB
STREPTOCOCCUS AGALACTIAE: NOT DETECTED
STREPTOCOCCUS PNEUMONIAE: NOT DETECTED
Serratia marcescens: NOT DETECTED
Streptococcus pyogenes: NOT DETECTED
Streptococcus species: NOT DETECTED
VANCOMYCIN RESISTANCE: NOT DETECTED

## 2016-03-21 LAB — HEMOGLOBIN A1C: Hgb A1c MFr Bld: 6.5 % — ABNORMAL HIGH (ref 4.0–6.0)

## 2016-03-21 MED ORDER — LEVOFLOXACIN IN D5W 750 MG/150ML IV SOLN
750.0000 mg | INTRAVENOUS | Status: DC
  Administered 2016-03-21: 750 mg via INTRAVENOUS
  Filled 2016-03-21 (×2): qty 150

## 2016-03-21 MED ORDER — SODIUM CHLORIDE 0.9 % IV SOLN
INTRAVENOUS | Status: AC
  Administered 2016-03-21 – 2016-03-22 (×2): via INTRAVENOUS

## 2016-03-21 MED ORDER — DIPHENHYDRAMINE HCL 25 MG PO CAPS
25.0000 mg | ORAL_CAPSULE | Freq: Three times a day (TID) | ORAL | Status: DC | PRN
  Administered 2016-03-21: 25 mg via ORAL
  Filled 2016-03-21: qty 1

## 2016-03-21 NOTE — Progress Notes (Signed)
Chaplain rounded the unit and provided a compassionate presence and spiritual support. Patient requested a ramp for home. Patrick Harding [phone]

## 2016-03-21 NOTE — Consult Note (Signed)
  Pt seen and examined. Please see Patrick Harding' notes. Pt with SOB, weakness, and FTT, and wt loss. No obvious GI sxs except for loss of appetite after dental work. Brother with colon cancer hx. Does need colonoscopy and possibly EGD. Unfortunately, pt on eliquis. Would discharge pt when stable. Make sure pt f/u with [place] in office next week. Will arrange outpatient endoscopies off eliquis in the next 2 weeks. Will sign off. Thanks.

## 2016-03-21 NOTE — Care Management Important Message (Signed)
Important Message  Patient Details  Name: Patrick Harding MRN: [id number] Date of Birth: 06-22-1934   Medicare Important Message Given:  Yes    Beau Fanny, RN 03/21/2016, 8:55 AM

## 2016-03-21 NOTE — Consult Note (Signed)
[facility] Vascular Consult Note  MRN : [id number]  Patrick Harding is a 82 y.o. (December 24, 1933) male who presents with chief complaint of  Chief Complaint  Patient presents with  . Weakness  .  History of Present Illness: patient admitted with weakness and shortness of breath.  Has had about a 25 pound weight loss over past few months without intentional losing the weight.  Says appetite is pretty good.  Has subjective right sided weakness but clinical exam seems pretty good and MRI was negative for stroke.  With some shortness of breath a chest CT was done which I have independently reviewed.  This demonstrates aneurysmal dilatation of the thoracic aorta with a maximal diameter in the ascending thoracic aorta of about 4.9 cm and a maximal diameter in the descending thoracic aorta of 4.3 cm.  The abdominal aorta was not imaged.  Given these findings, Dr. Margaretmary Eddy has asked me to see the patient and determine treatment plan.  He has no signs of peripheral embolization.  He has no tearing chest or back pain.  He has no fever or chills.  Current Facility-Administered Medications  Medication Dose Route Frequency Provider Last Rate Last Dose  . 0.9 % NaCl with KCl 40 mEq / L  infusion   Intravenous Continuous Harrie Foreman, MD 100 mL/hr at 03/21/16 0223 100 mL/hr at 03/21/16 0223  . acetaminophen (TYLENOL) tablet 650 mg  650 mg Oral Q6H PRN Harrie Foreman, MD       Or  . acetaminophen (TYLENOL) suppository 650 mg  650 mg Rectal Q6H PRN Harrie Foreman, MD      . albuterol (PROVENTIL) (2.5 MG/3ML) 0.083% nebulizer solution 3 mL  3 mL Inhalation Q6H PRN Harrie Foreman, MD      . apixaban Arne Cleveland) tablet 5 mg  5 mg Oral BID Harrie Foreman, MD   5 mg at 03/20/16 2244  . diltiazem (CARDIZEM CD) 24 hr capsule 180 mg  180 mg Oral Daily Harrie Foreman, MD   180 mg at 03/20/16 1121  . docusate sodium (COLACE) capsule 100 mg  100 mg Oral BID Harrie Foreman, MD   100 mg at  03/20/16 2245  . feeding supplement (ENSURE ENLIVE) (ENSURE ENLIVE) liquid 237 mL  237 mL Oral TID BM Nicholes Mango, MD   237 mL at 03/20/16 2016  . levofloxacin (LEVAQUIN) IVPB 750 mg  750 mg Intravenous Q48H Harrie Foreman, MD      . metoprolol (LOPRESSOR) tablet 50 mg  50 mg Oral BID Harrie Foreman, MD   50 mg at 03/20/16 2244  . morphine 2 MG/ML injection 2 mg  2 mg Intravenous Q4H PRN Harrie Foreman, MD      . multivitamin with minerals tablet 1 tablet  1 tablet Oral Q supper Harrie Foreman, MD   1 tablet at 03/20/16 1702  . omega-3 acid ethyl esters (LOVAZA) capsule 1 g  1 g Oral Daily Harrie Foreman, MD   1 g at 03/20/16 1121  . ondansetron (ZOFRAN) tablet 4 mg  4 mg Oral Q6H PRN Harrie Foreman, MD       Or  . ondansetron [facility]) injection 4 mg  4 mg Intravenous Q6H PRN Harrie Foreman, MD      . oxyCODONE-acetaminophen (PERCOCET/ROXICET) 5-325 MG per tablet 1 tablet  1 tablet Oral Q6H PRN Nicholes Mango, MD      . sodium chloride flush (NS) 0.9 %  injection 3 mL  3 mL Intravenous Q12H Harrie Foreman, MD   3 mL at 03/20/16 2200  . torsemide (DEMADEX) tablet 20 mg  20 mg Oral Daily Harrie Foreman, MD   20 mg at 03/20/16 1121    Past Medical History  Diagnosis Date  . Hyperlipidemia   . Anemia   . Vitamin D deficiency   . Hypertension   . Renal insufficiency   . Erectile dysfunction   . Arrhythmia   . Atrial fibrillation [facility])     Past Surgical History  Procedure Laterality Date  . Head surgery Left     Social History Social History  Substance Use Topics  . Smoking status: Former Smoker -- 0.50 packs/day for 20 years    Types: Cigarettes    Quit date: 10/24/2015  . Smokeless tobacco: None  . Alcohol Use: No     Comment: occasional  No IVDU  Family History Family History  Problem Relation Age of Onset  . Arthritis Father   no history of bleeding disorders, clotting disorders, or autoimmune diseases Mother had a pacemaker  Allergies  Allergen  Reactions  . Aspirin Itching  . Penicillins Hives, Itching and Other (See Comments)    Has patient had a PCN reaction causing immediate rash, facial/tongue/throat swelling, SOB or lightheadedness with hypotension: No Has patient had a PCN reaction causing severe rash involving mucus membranes or skin necrosis: No Has patient had a PCN reaction that required hospitalization No Has patient had a PCN reaction occurring within the last 10 years: No If all of the above answers are "NO", then may proceed with Cephalosporin use.     REVIEW OF SYSTEMS (Negative unless checked)  Constitutional: '[x]'$ Weight loss  '[]'$ Fever  '[]'$ Chills Cardiac: '[]'$ Chest pain   '[]'$ Chest pressure   '[x]'$ Palpitations   '[]'$ Shortness of breath when laying flat   '[x]'$ Shortness of breath at rest   '[x]'$ Shortness of breath with exertion. Vascular:  '[]'$ Pain in legs with walking   '[]'$ Pain in legs at rest   '[]'$ Pain in legs when laying flat   '[]'$ Claudication   '[]'$ Pain in feet when walking  '[]'$ Pain in feet at rest  '[]'$ Pain in feet when laying flat   '[]'$ History of DVT   '[]'$ Phlebitis   '[]'$ Swelling in legs   '[]'$ Varicose veins   '[]'$ Non-healing ulcers Pulmonary:   '[]'$ Uses home oxygen   '[x]'$ Productive cough   '[]'$ Hemoptysis   '[x]'$ Wheeze  '[]'$ COPD   '[]'$ Asthma Neurologic:  '[]'$ Dizziness  '[]'$ Blackouts   '[]'$ Seizures   '[]'$ History of stroke   '[]'$ History of TIA  '[]'$ Aphasia   '[]'$ Temporary blindness   '[]'$ Dysphagia   '[]'$ Weakness or numbness in arms   '[]'$ Weakness or numbness in legs Musculoskeletal:  '[]'$ Arthritis   '[]'$ Joint swelling   '[]'$ Joint pain   '[]'$ Low back pain Hematologic:  '[]'$ Easy bruising  '[]'$ Easy bleeding   '[]'$ Hypercoagulable state   '[]'$ Anemic  '[]'$ Hepatitis Gastrointestinal:  '[]'$ Blood in stool   '[]'$ Vomiting blood  '[]'$ Gastroesophageal reflux/heartburn   '[]'$ Difficulty swallowing. Genitourinary:  '[x]'$ Chronic kidney disease   '[]'$ Difficult urination  '[]'$ Frequent urination  '[]'$ Burning with urination   '[]'$ Blood in urine Skin:  '[]'$ Rashes   '[]'$ Ulcers   '[]'$ Wounds Psychological:  '[]'$ History of anxiety   '[]'$  History of  major depression.  Physical Examination  Filed Vitals:   03/20/16 2105 03/21/16 0500 03/21/16 0518 03/21/16 0923  BP: 110/46  126/49 112/61  Pulse: 78  98 90  Temp: 98 F (36.7 C)  98 F (36.7 C)   TempSrc: Oral  Oral   Resp: 18  18   Height:      Weight:  56.564 kg (124 lb 11.2 oz)    SpO2: 100%  99%    Body mass index is 16.46 kg/(m^2). Gen:  thin, NAD Head: right side of face s/p reconstructive surgery, well healed.  Temporal wasting is present.  Ear/Nose/Throat: Hearing grossly intact, nares w/o erythema or drainage, oropharynx w/o Erythema/Exudate Eyes: PERRLA, EOMI.  Neck: Supple, no nuchal rigidity.  No JVD.  Pulmonary:  Good air movement, equal bilaterally.  Cardiac: irregularly irregular Vascular:  Vessel Right Left  Radial Palpable Palpable  Ulnar Palpable Palpable  Brachial Palpable Palpable  Carotid Palpable, without bruit Palpable, without bruit  Aorta Not palpable N/A  Femoral Palpable Palpable  Popliteal Palpable Not Palpable  PT Palpable Not Palpable  DP Not Palpable Palpable   Gastrointestinal: soft, non-tender/non-distended. No guarding/reflex. No masses, surgical incisions, or scars. Musculoskeletal:   Extremities without ischemic changes.  No deformity or atrophy. No edema. Neurologic: CN 2-12 intact. Pain and light touch intact in extremities.  Symmetrical.  Speech is fluent.  Psychiatric: Judgment intact, Mood & affect appropriate for pt's clinical situation. Dermatologic: No rashes or ulcers noted.  No cellulitis or open wounds. Lymph : No Cervical, Axillary, or Inguinal lymphadenopathy.     CBC Lab Results  Component Value Date   WBC 11.2* 03/21/2016   HGB 11.5* 03/21/2016   HCT 33.4* 03/21/2016   MCV 86.7 03/21/2016   PLT 206 03/21/2016    BMET    Component Value Date/Time   NA 139 03/21/2016 0506   K 5.1 03/21/2016 0506   CL 108 03/21/2016 0506   CO2 26 03/21/2016 0506   GLUCOSE 90 03/21/2016 0506   BUN 26* 03/21/2016 0506    CREATININE 0.93 03/21/2016 0506   CALCIUM 9.3 03/21/2016 0506   GFRNONAA >60 03/21/2016 0506   GFRAA >60 03/21/2016 0506   Estimated Creatinine Clearance: 51.6 mL/min (by C-G formula based on Cr of 0.93).  COAG No results found for: INR, PROTIME  Radiology Dg Chest 2 View  03/19/2016  CLINICAL DATA:  Cough for 3 weeks.  Weakness. EXAM: CHEST  2 VIEW COMPARISON:  February 17, 2016 FINDINGS: There is no edema or consolidation. There is a nodular opacity in the right upper lobe measuring 9 x 6 mm. Heart size and pulmonary vascularity are normal. No adenopathy. There is atherosclerotic change in the aorta. No adenopathy. IMPRESSION: 9 x 6 mm nodular opacity right upper lobe. Advise noncontrast enhanced chest CT to further assess. No edema or consolidation. Electronically Signed   By: Lowella Grip III M.D.   On: 03/19/2016 22:31   Ct Chest Wo Contrast  03/20/2016  CLINICAL DATA:  Acute onset of hypotension and generalized weakness. Decreased oral intake. Cough. Evaluate nodule noted on chest radiograph. Initial encounter. EXAM: CT CHEST WITHOUT CONTRAST TECHNIQUE: Multidetector CT imaging of the chest was performed following the standard protocol without IV contrast. COMPARISON:  Chest radiograph performed earlier today at 10:20 p.m. FINDINGS: Patchy nodular airspace opacities are noted within the right upper and middle lobes, and minimally within the right lower lobe, compatible with multifocal pneumonia, possibly atypical in nature. Minimal left basilar atelectasis and scarring is noted. No pleural effusion or pneumothorax is seen. No dominant masses are identified. Diffuse coronary artery calcifications are seen. There is aneurysmal dilatation of the ascending thoracic aorta to 4.9 cm in AP dimension, and aneurysmal dilatation of the descending thoracic aorta to 4.2 cm in AP dimension. No pericardial effusion is identified.  No mediastinal lymphadenopathy is seen. The great vessels are grossly  unremarkable. The visualized portions of thyroid gland are unremarkable. No axillary lymphadenopathy is seen. The visualized portions of the liver and spleen are grossly unremarkable in appearance. The visualized portions of the gallbladder, pancreas, adrenal glands and kidneys are within normal limits. No acute osseous abnormalities are identified. IMPRESSION: 1. Patchy nodular airspace opacities within the right middle and upper lung lobes, and minimally within the right lower lung lobe, compatible with multifocal pneumonia, possibly atypical in nature. This corresponds to the finding on chest radiograph. 2. Minimal left basilar atelectasis and scarring noted. 3. Diffuse coronary artery calcifications seen. 4. Aneurysmal dilatation of the ascending thoracic aorta to 4.9 cm in AP dimension, and at the descending thoracic aorta to 4.2 cm in AP dimension. Recommend semi-annual imaging followup by CTA or MRA and referral to cardiothoracic surgery if not already obtained. This recommendation follows 2010 ACCF/AHA/AATS/ACR/ASA/SCA/SCAI/SIR/STS/SVM Guidelines for the Diagnosis and Management of Patients With Thoracic Aortic Disease. Circulation. 2010; 121: [id number] Electronically Signed   By: Garald Balding M.D.   On: 03/20/2016 00:40   Mr Jeri Cos IR Contrast  03/20/2016  CLINICAL DATA:  Atrial fibrillation.  Weakness. EXAM: MRI HEAD WITHOUT AND WITH CONTRAST TECHNIQUE: Multiplanar, multiecho pulse sequences of the brain and surrounding structures were obtained without and with intravenous contrast. CONTRAST:  40m MULTIHANCE GADOBENATE DIMEGLUMINE 529 MG/ML IV SOLN COMPARISON:  Head CT 07/27/2005 FINDINGS: Calvarium and upper cervical spine: Heterogeneous marrow in the upper cervical spine and clivus without focal masslike lesion. This may be related to patient's history of chronic renal insufficiency. There is no historical indication of malignancy history. Remote right zygomatic arch and orbital rim repair for  tripod fracture. Orbits: Left cataract resection. Sinuses and Mastoids: Clear. Brain: No acute abnormality such as acute infarct, hemorrhage, hydrocephalus, or mass lesion. No evidence of large vessel occlusion. Chronic small vessel disease with mild white matter ischemic gliosis. Small area of remote cortically based infarct in the posterior left frontal lobe. Small-vessel remote infarct in the peripheral left cerebellum. Cortical atrophy most notable at the vertex. IMPRESSION: 1. No acute finding, including infarct. 2. Atrophy and chronic ischemic injury as described above. Electronically Signed   By: JMonte FantasiaM.D.   On: 03/20/2016 16:28   Mr Lumbar Spine Wo Contrast  02/22/2016  CLINICAL DATA:  Low back pain and bilateral leg pain with numbness tingling. EXAM: MRI LUMBAR SPINE WITHOUT CONTRAST TECHNIQUE: Multiplanar, multisequence MR imaging of the lumbar spine was performed. No intravenous contrast was administered. COMPARISON:  CT myelogram dated 09/27/2005 FINDINGS: Normal conus tip at L1-2.  Normal paraspinal soft tissues. There T11-12 and T12-L1:  Normal. L1-2:  Tiny broad-based disc bulge with no neural impingement. L2-3: Small broad-based disc bulge with a small soft disc protrusion in and lateral to the right neural foramen. The L2 nerve appears to exit without impingement. Slight narrowing of the spinal canal due to the broad-based disc bulge. L3-4: Small broad-based disc bulge extending into both neural foramina without neural impingement. Minimal compression of the thecal sac. L4-5: Disc space narrowing with a small broad-based disc bulge extending into both neural foramina without neural impingement. Previous left hemilaminotomy. Slight arthritic changes of the left facet joint. No neural impingement. Moderately severe left foraminal stenosis. L5-S1: Small broad-based disc bulge with a small disc protrusion just to the right of midline without neural impingement. Moderate bilateral foraminal  stenosis. However, the L5 nerves appear to exit without impingement. Slight hypertrophy of  the ligamentum flavum and left facet joint. IMPRESSION: Multilevel degenerative disc disease and joint disease without focal neural impingement. Postsurgical changes at L4-5 on the left. Electronically Signed   By: Lorriane Shire M.D.   On: 02/22/2016 12:31      Assessment/Plan 1. Thoracic aortic aneurysm without rupture. chest CT was done which I have independently reviewed.  This demonstrates aneurysmal dilatation of the thoracic aorta with a maximal diameter in the ascending thoracic aorta of about 4.9 cm and a maximal diameter in the descending thoracic aorta of 4.3 cm.  The abdominal aorta was not imaged.  At these sizes, no immediate risk is present.  Risk of rupture less than 1% per year at these sizes.  Would not recommend repair until the sizes approach 6 cm.  Will need outpatient follow up regularly, and I will plan to see him in the office in a few months to arrange long term follow up. 2. Hypertension.  BP control very important in reducing growth of these aortic aneurysms.  Needs tight control with oral medications at discharge. 3. Hyperlipidemia. Control reduces progression of atherosclerotic disease. 4. Weakness. MRI negative.  Work up per primary service 5. Weight loss. Concerning for malignancy.  Dietary to see to help with supplements.    DEW,JASON, MD  03/21/2016 10:05 AM

## 2016-03-21 NOTE — Progress Notes (Signed)
[facility] NAME: Patrick Harding    MR#:  [id number]  DATE OF BIRTH:  September 22, 1934  SUBJECTIVE:  CHIEF COMPLAINT:  Patient is feeling okay shortness of breath is better, reporting some cough. Reports that he had significant weight loss of 30 pounds in 2 weeks after his stent he forgot to let the same time as he was unable to eat . Denies any chest pain or abdominal pain. Reporting cough  REVIEW OF SYSTEMS:  CONSTITUTIONAL: No fever, fatigue or weakness.  EYES: No blurred or double vision.  EARS, NOSE, AND THROAT: No tinnitus or ear pain.  RESPIRATORY: has cough, denies any shortness of breath, wheezing or hemoptysis.  CARDIOVASCULAR: No chest pain, orthopnea, edema.  GASTROINTESTINAL: No nausea, vomiting, diarrhea or abdominal pain.  GENITOURINARY: No dysuria, hematuria.  ENDOCRINE: No polyuria, nocturia,  HEMATOLOGY: No anemia, easy bruising or bleeding SKIN: No rash or lesion. MUSCULOSKELETAL: No joint pain or arthritis.   NEUROLOGIC: No tingling, numbness, weakness.  PSYCHIATRY: No anxiety or depression.   DRUG ALLERGIES:   Allergies  Allergen Reactions  . Aspirin Itching  . Penicillins Hives, Itching and Other (See Comments)    Has patient had a PCN reaction causing immediate rash, facial/tongue/throat swelling, SOB or lightheadedness with hypotension: No Has patient had a PCN reaction causing severe rash involving mucus membranes or skin necrosis: No Has patient had a PCN reaction that required hospitalization No Has patient had a PCN reaction occurring within the last 10 years: No If all of the above answers are "NO", then may proceed with Cephalosporin use.    VITALS:  Blood pressure 113/48, pulse 92, temperature 98 F (36.7 C), temperature source Oral, resp. rate 19, height '6\' 1"'$  (1.854 m), weight 56.564 kg (124 lb 11.2 oz), SpO2 100 %.  PHYSICAL EXAMINATION:  GENERAL:  82 y.o.-year-old patient lying in the bed with  no acute distress.  EYES: Pupils equal, round, reactive to light and accommodation. No scleral icterus. Extraocular muscles intact.  HEENT: Head atraumatic, normocephalic. Oropharynx and nasopharynx clear.  NECK:  Supple, no jugular venous distention. No thyroid enlargement, no tenderness.  LUNGS: Moderate breath sounds bilaterally, no wheezing, rales,rhonchi or crepitation. No use of accessory muscles of respiration.  CARDIOVASCULAR: S1, S2 normal. No murmurs, rubs, or gallops.  ABDOMEN: Soft, nontender, nondistended. Bowel sounds present. No organomegaly or mass.  EXTREMITIES: No pedal edema, cyanosis, or clubbing.  NEUROLOGIC: Cranial nerves II through XII are intact. Muscle strength 5/5 in all extremities. Sensation intact. Gait not checked.  PSYCHIATRIC: The patient is alert and oriented x 3.  SKIN: No obvious rash, lesion, or ulcer.    LABORATORY PANEL:   CBC  Recent Labs Lab 03/21/16 0506  WBC 11.2*  HGB 11.5*  HCT 33.4*  PLT 206   ------------------------------------------------------------------------------------------------------------------  Chemistries   Recent Labs Lab 03/21/16 0506  NA 139  K 5.1  CL 108  CO2 26  GLUCOSE 90  BUN 26*  CREATININE 0.93  CALCIUM 9.3   ------------------------------------------------------------------------------------------------------------------  Cardiac Enzymes  Recent Labs Lab 03/19/16 2042  TROPONINI 0.03   ------------------------------------------------------------------------------------------------------------------  RADIOLOGY:  Dg Chest 2 View  03/19/2016  CLINICAL DATA:  Cough for 3 weeks.  Weakness. EXAM: CHEST  2 VIEW COMPARISON:  February 17, 2016 FINDINGS: There is no edema or consolidation. There is a nodular opacity in the right upper lobe measuring 9 x 6 mm. Heart size and pulmonary vascularity are normal. No adenopathy. There is atherosclerotic change  in the aorta. No adenopathy. IMPRESSION: 9 x 6 mm  nodular opacity right upper lobe. Advise noncontrast enhanced chest CT to further assess. No edema or consolidation. Electronically Signed   By: Lowella Grip III M.D.   On: 03/19/2016 22:31   Ct Chest Wo Contrast  03/20/2016  CLINICAL DATA:  Acute onset of hypotension and generalized weakness. Decreased oral intake. Cough. Evaluate nodule noted on chest radiograph. Initial encounter. EXAM: CT CHEST WITHOUT CONTRAST TECHNIQUE: Multidetector CT imaging of the chest was performed following the standard protocol without IV contrast. COMPARISON:  Chest radiograph performed earlier today at 10:20 p.m. FINDINGS: Patchy nodular airspace opacities are noted within the right upper and middle lobes, and minimally within the right lower lobe, compatible with multifocal pneumonia, possibly atypical in nature. Minimal left basilar atelectasis and scarring is noted. No pleural effusion or pneumothorax is seen. No dominant masses are identified. Diffuse coronary artery calcifications are seen. There is aneurysmal dilatation of the ascending thoracic aorta to 4.9 cm in AP dimension, and aneurysmal dilatation of the descending thoracic aorta to 4.2 cm in AP dimension. No pericardial effusion is identified. No mediastinal lymphadenopathy is seen. The great vessels are grossly unremarkable. The visualized portions of thyroid gland are unremarkable. No axillary lymphadenopathy is seen. The visualized portions of the liver and spleen are grossly unremarkable in appearance. The visualized portions of the gallbladder, pancreas, adrenal glands and kidneys are within normal limits. No acute osseous abnormalities are identified. IMPRESSION: 1. Patchy nodular airspace opacities within the right middle and upper lung lobes, and minimally within the right lower lung lobe, compatible with multifocal pneumonia, possibly atypical in nature. This corresponds to the finding on chest radiograph. 2. Minimal left basilar atelectasis and scarring  noted. 3. Diffuse coronary artery calcifications seen. 4. Aneurysmal dilatation of the ascending thoracic aorta to 4.9 cm in AP dimension, and at the descending thoracic aorta to 4.2 cm in AP dimension. Recommend semi-annual imaging followup by CTA or MRA and referral to cardiothoracic surgery if not already obtained. This recommendation follows 2010 ACCF/AHA/AATS/ACR/ASA/SCA/SCAI/SIR/STS/SVM Guidelines for the Diagnosis and Management of Patients With Thoracic Aortic Disease. Circulation. 2010; 121: Z366-Y403 Electronically Signed   By: Garald Balding M.D.   On: 03/20/2016 00:40   Mr Jeri Cos KV Contrast  03/20/2016  CLINICAL DATA:  Atrial fibrillation.  Weakness. EXAM: MRI HEAD WITHOUT AND WITH CONTRAST TECHNIQUE: Multiplanar, multiecho pulse sequences of the brain and surrounding structures were obtained without and with intravenous contrast. CONTRAST:  37m MULTIHANCE GADOBENATE DIMEGLUMINE 529 MG/ML IV SOLN COMPARISON:  Head CT 07/27/2005 FINDINGS: Calvarium and upper cervical spine: Heterogeneous marrow in the upper cervical spine and clivus without focal masslike lesion. This may be related to patient's history of chronic renal insufficiency. There is no historical indication of malignancy history. Remote right zygomatic arch and orbital rim repair for tripod fracture. Orbits: Left cataract resection. Sinuses and Mastoids: Clear. Brain: No acute abnormality such as acute infarct, hemorrhage, hydrocephalus, or mass lesion. No evidence of large vessel occlusion. Chronic small vessel disease with mild white matter ischemic gliosis. Small area of remote cortically based infarct in the posterior left frontal lobe. Small-vessel remote infarct in the peripheral left cerebellum. Cortical atrophy most notable at the vertex. IMPRESSION: 1. No acute finding, including infarct. 2. Atrophy and chronic ischemic injury as described above. Electronically Signed   By: JMonte FantasiaM.D.   On: 03/20/2016 16:28    EKG:    Orders placed or performed during the hospital encounter of  03/19/16  . ED EKG  . ED EKG  . EKG 12-Lead  . EKG 12-Lead    ASSESSMENT AND PLAN:   This is a 82 year old male admitted for atrial fibrillation with rapid ventricular response as well as multifocal pneumonia.  # . Multifocal pneumonia: Clinically improving. Continue levofloxacin and follow up on the sputum culture and sensitivity if collected. Blood cultures with coag-negative staph aureus    #Atrial fibrillation: Rapid ventricular response controlled with Cardizem.  continue to monitor on telemetry. 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 Appreciate Vascular surgery recommendations. Needs regular outpatient follow-up for close monitoring  # unintentional weight loss: May be secondary to recent tooth extraction, reports 10 teeth got extracted at the same time since then he was unable to eat properly  The patient has not had a colonoscopy. CEA is elevated. No colonic, prostate or liver lesions seen on CT scan.  Dietary consult. Provide nutrition supplements. GI consult is still pending, but patient prefers getting colonoscopy as an outpatient  #. Chronic kidney disease: Stage III; avoid nephrotoxic agents. Hydrate with intravenous fluid  #. Essential hypertension: Continue metoprolol and diltiazem  #. Right-sided weakness: Mostly right lower extremity. The patient states that this is an acute on chronic problem. He is anticoagulated but risk of stroke still exists. He was previously scheduled for an outpatient MRI of the brain -no acute findings   8. Case management consult: The patient's wife has been his primary caregiver but she is now unable to help with transfers due to recent back surgery. He has physical therapy as an outpatient that has been unable to obtain durable medical equipment for help at home.  9. DVT prophylaxis: As above  10. GI prophylaxis:  None     All the records are reviewed and case discussed with Care Management/Social Workerr. Management plans discussed with the patient, family and they are in agreement.  CODE STATUS: fc  TOTAL TIME TAKING CARE OF THIS PATIENT: 35  minutes.   POSSIBLE D/C IN  Am   DAYS, DEPENDING ON CLINICAL CONDITION.   Nicholes Mango M.D on 03/21/2016 at 2:06 PM  Between 7am to 6pm - Pager - [phone] After 6pm go to [email or web address] - password [email or web address] [facility]  Office  [phone]  CC: Primary care physician; Casilda Carls, MD

## 2016-03-21 NOTE — Evaluation (Addendum)
Occupational Therapy Evaluation Patient Details Name: Patrick Harding MRN: [id number] DOB: [birth year] Today's Date: 03/21/2016    History of Present Illness presented to ER with generalized weakness; admitted with afib with RVR (managed with cardizem) and multifocal PNA.   Clinical Impression   Pt. was admitted to [facility] for Community acquired Pneumonia. Pt. Presents with weakness and decreased activity tolerance which hinder his ability to complete ADL and IADL functioning. Pt. could benefit from continued skilled OT services to improve ADL functioning, review adaptive equipment, and review energy conservation/work simplification techniques.    Follow Up Recommendations  Home health OT    Equipment Recommendations    w/c   Recommendations for Other Services PT consult     Precautions / Restrictions Precautions Precautions: Fall Restrictions Weight Bearing Restrictions: No      Mobility Bed Mobility               General bed mobility comments: Pt. up in chair upon arrival  Transfers                      Balance Overall balance assessment: Independent   Sitting balance-Leahy Scale: Good                                      ADL Overall ADL's : Needs assistance/impaired Eating/Feeding: Independent   Grooming: Independent   Upper Body Bathing: Set up;Minimal assitance           Lower Body Dressing: Maximal assistance                       Vision     Perception     Praxis      Pertinent Vitals/Pain Pain Assessment: 0-10 Pain Score: 0-No pain Pain Location: Left foot discomfort.      Hand Dominance     Extremity/Trunk Assessment Upper Extremity Assessment Upper Extremity Assessment: Overall WFL for tasks assessed   Lower Extremity Assessment Lower Extremity Assessment: Generalized weakness       Communication Communication Communication: No difficulties   Cognition                            General Comments       Exercises       Shoulder Instructions      Home Living Family/patient expects to be discharged to:: Private residence Living Arrangements: Spouse/significant other Available Help at Discharge: Family;Available 24 hours/day Type of Home: House Home Access: Stairs to enter [employer] of Steps: 3 Entrance Stairs-Rails: None Home Layout: Multi-level;Laundry or work area in basement;Able to live on main level with bedroom/bathroom     Bathroom Shower/Tub: Tub/shower unit;Door Shower/tub characteristics: Door (Sliding doors.)                  Prior Functioning/Environment Level of Independence: Needs assistance  Gait / Transfers Assistance Needed: Pt. reports being w/c bound at home, completed ADLs from w/c level. ADL's / Homemaking Assistance Needed: WIfe Assist with ADL/IADLs   Comments: Wife assists with ADLs (sponge bath) and basic transfers (WC level) as able, but is limited in ability due to recent back surgery per patient report    OT Diagnosis: Generalized weakness   OT Problem List: Decreased strength;Decreased activity tolerance;Decreased knowledge of use of DME or AE;Pain   OT Treatment/Interventions:  OT Goals(Current goals can be found in the care plan section) ADL Goals Pt Will Perform Grooming: Independently Pt Will Perform Lower Body Dressing: with modified independence;with set-up;with adaptive equipment  OT Frequency:     Barriers to D/C:            Co-evaluation              End of Session    Activity Tolerance: Patient tolerated treatment well Patient left: in chair;with call bell/phone within reach;with chair alarm set   Time: 7628-3151 OT Time Calculation (min): 25 min Charges:  OT General Charges $OT Visit: 1 Procedure OT Evaluation $OT Eval Moderate Complexity: 1 Procedure G-Codes:    [name], MS, OTR/L [name] 03/21/2016, 1:14 PM

## 2016-03-21 NOTE — Progress Notes (Signed)
Pharmacy Antibiotic Follow-up Note  Patrick Harding is a 82 y.o. year-old male admitted on 03/19/2016.  The patient is currently on day 1 of levofloxacin for CAP.  Assessment/Plan: Patient currently ordered Levofloxacin 750 mg IV q48h.  Est CrCl ~51.6 mL/min.  Therefore, will transition patient to Levofloxacin 750 mg IV q24h.   Temp (24hrs), Avg:98 F (36.7 C), Min:98 F (36.7 C), Max:98 F (36.7 C)   Recent Labs Lab 03/19/16 2042 03/21/16 0506  WBC 14.9* 11.2*     Recent Labs Lab 03/19/16 2042 03/21/16 0506  CREATININE 1.31* 0.93   Estimated Creatinine Clearance: 51.6 mL/min (by C-G formula based on Cr of 0.93).    Allergies  Allergen Reactions  . Aspirin Itching  . Penicillins Hives, Itching and Other (See Comments)    Has patient had a PCN reaction causing immediate rash, facial/tongue/throat swelling, SOB or lightheadedness with hypotension: No Has patient had a PCN reaction causing severe rash involving mucus membranes or skin necrosis: No Has patient had a PCN reaction that required hospitalization No Has patient had a PCN reaction occurring within the last 10 years: No If all of the above answers are "NO", then may proceed with Cephalosporin use.    Thank you for allowing pharmacy to be a part of this patient's care.  Aiken Withem G, Pharm.D., BCPS Clinical Pharmacist 03/21/2016 1:13 PM

## 2016-03-21 NOTE — Consult Note (Signed)
GI Inpatient Consult Note  Reason for Consult: failure to thrive    Attending Requesting Consult: Dr. Margaretmary Eddy  History of Present Illness: Patrick Harding is a 82 y.o. male seen for evaluation of failure to thrive at the request of Dr. Margaretmary Eddy.  PMHx of HLD, HTN, CKD, atrial fib, CHF admitted for SOB, weakness. Found to be in atrial fib w/ RVR and also had multifocal pneumonia. During admission was noted to have unintentional weight loss-per pt/wife he has lost 31 lbs. Unsure when weight loss began definitely but around Thanksgiving 2016. He reports shortly prior he had dental work which makes it hard for him to chew. He reports anorexia -  "nothing looks or tastes good" but denies any post prandial nausea, vomiting, or abdominal pain.  He denies GERD/dysphagia. He reports forcing himself to eat. He has constipation with a BM about every 3 days which he controls w/ prunes, also planning to start stool softener. Constipation worsening over past few months. He denies any alternating diarrhea, rectal bleeding, dark stools. He does think he started pain medications around the time of the poor appetite.   Wife at bedside and helps w/ history.    Last Colonoscopy: None prior Last Endoscopy: None prior    Past Medical History:  Past Medical History  Diagnosis Date  . Hyperlipidemia   . Anemia   . Vitamin D deficiency   . Hypertension   . Renal insufficiency   . Erectile dysfunction   . Arrhythmia   . Atrial fibrillation [facility])     Problem List: Patient Active Problem List   Diagnosis Date Noted  . CAP (community acquired pneumonia) 03/20/2016  . Protein-calorie malnutrition, severe 03/20/2016  . Speech and language deficits 03/12/2016  . Dysarthria due to recent stroke 03/12/2016  . Stroke determined by clinical assessment ([place]) 03/12/2016  . Lumbar foraminal stenosis (Left Moderate-severe L4-5) (Bilateral Moderate L5-S1) 03/12/2016  . Abnormal MRI, lumbar spine (2012) 01/24/2016  . Chronic  low back pain (Location of Secondary source of pain) (Bilateral) (R>L) 01/24/2016  . Chronic lower extremity radicular pain (Location of Primary Source of Pain) (Right) (L3/L4 dermatome) 01/24/2016  . Chronic lumbar radicular pain (Location of Primary Source of Pain) (Right) (L3/L4 dermatome) 01/24/2016  . Chronic pain 01/24/2016  . Long term current use of anticoagulant therapy (Eliquis) 01/24/2016  . Long-term use of high-risk medication 01/24/2016  . 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 Lower extremity weakness (Right) 01/24/2016  . Abnormal loss of weight 12/26/2015  . 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 Lung mass 12/26/2015  . Aortic aneurysm ([place]) 12/26/2015  . Acute on chronic systolic heart failure ([place]) 12/26/2015  . Spinal stenosis, lumbar region, with neurogenic claudication 08/10/2015  . Lumbar facet syndrome 08/10/2015  . Sacroiliac joint dysfunction 08/10/2015  . Atrial fibrillation, unspecified 04/14/2015  . Essential hypertension 04/14/2015  . Atrial fibrillation ([place]) 04/14/2015    Past Surgical History: Past Surgical History  Procedure Laterality Date  . Head surgery Left     Allergies: Allergies  Allergen Reactions  . Aspirin Itching  . Penicillins Hives, Itching and Other (See Comments)    Has patient had a PCN reaction causing immediate rash, facial/tongue/throat swelling, SOB or lightheadedness with hypotension: No Has patient had a PCN reaction causing severe rash involving mucus membranes or skin necrosis: No Has patient had a PCN reaction that required hospitalization No Has patient had a PCN reaction occurring within the last 10 years: No If all of the above  answers are "NO", then may proceed with Cephalosporin use.    Home Medications: Prescriptions prior to admission  Medication Sig Dispense Refill Last Dose  . acetaminophen (TYLENOL) 500 MG tablet Take 1,000 mg by mouth every 6 (six) hours as needed for mild pain or  headache.   prn at prn  . albuterol (PROVENTIL HFA;VENTOLIN HFA) 108 (90 Base) MCG/ACT inhaler Inhale 2 puffs into the lungs every 6 (six) hours as needed for wheezing or shortness of breath. 1 Inhaler 2 prn at prn  . apixaban (ELIQUIS) 5 MG TABS tablet Take 1 tablet (5 mg total) by mouth 2 (two) times daily. 60 tablet 3 unknown at unknown  . diltiazem (CARDIZEM CD) 180 MG 24 hr capsule Take 180 mg by mouth daily.   unknown at unknown  . metoprolol (LOPRESSOR) 50 MG tablet Take 1 tablet (50 mg total) by mouth 2 (two) times daily. 60 tablet 2 unknown at unknown  . Multiple Vitamin (MULTIVITAMIN WITH MINERALS) TABS tablet Take 1 tablet by mouth daily.   unknown at unknown  . omega-3 acid ethyl esters (LOVAZA) 1 g capsule Take 1 g by mouth daily. Reported on 03/12/2016   unknown at unknown  . torsemide (DEMADEX) 20 MG tablet Take 20 mg by mouth daily.   unknown at unknown   Home medication reconciliation was completed with the patient.   Scheduled Inpatient Medications:   . apixaban  5 mg Oral BID  . diltiazem  180 mg Oral Daily  . docusate sodium  100 mg Oral BID  . feeding supplement (ENSURE ENLIVE)  237 mL Oral TID BM  . levofloxacin (LEVAQUIN) IV  750 mg Intravenous Q24H  . metoprolol  50 mg Oral BID  . multivitamin with minerals  1 tablet Oral Q supper  . omega-3 acid ethyl esters  1 g Oral Daily  . sodium chloride flush  3 mL Intravenous Q12H  . torsemide  20 mg Oral Daily    Continuous Inpatient Infusions:   . 0.9 % NaCl with KCl 40 mEq / L 100 mL/hr (03/21/16 1409)    PRN Inpatient Medications:  acetaminophen **OR** acetaminophen, albuterol, morphine injection, ondansetron **OR** ondansetron (ZOFRAN) IV, oxyCODONE-acetaminophen  Family History: family history includes Arthritis in his father.  Sister had colon cancer in her 46s.   Social History:   reports that he quit smoking about 4 months ago. His smoking use included Cigarettes. He has a 10 pack-year smoking history. He  does not have any smokeless tobacco history on file. He reports that he does not drink alcohol or use illicit drugs.    Review of Systems: Constitutional: + weight loss-see above.  Eyes: No changes in vision. ENT: No oral lesions, sore throat.  GI: see HPI.  Heme/Lymph: No easy bruising.  CV: No chest pain.  GU: No hematuria.  Integumentary: No rashes.  Neuro: No headaches.  Psych: No depression/anxiety.  Endocrine: No heat/cold intolerance.  Allergic/Immunologic: No urticaria.  Resp: + cough, SOB.  Musculoskeletal: + weakness, back pain.     Physical Examination: BP 113/48 mmHg  Pulse 92  Temp(Src) 98 F (36.7 C) (Oral)  Resp 19  Ht '6\' 1"'$  (1.854 m)  Wt 124 lb 11.2 oz (56.564 kg)  BMI 16.46 kg/m2  SpO2 100% Gen: NAD, alert and oriented x 4, thin  HEENT: PEERLA, EOMI, Neck: supple, no JVD or thyromegaly Chest: CTA bilaterally, no wheezes, crackles, or other adventitious sounds CV: RRR, no m/g/c/r Abd: soft, NT, ND, +BS in all four quadrants;  no HSM, guarding, ridigity, or rebound tenderness Ext: no edema, well perfused with 2+ pulses, Skin: no rash or lesions noted Lymph: no LAD  Data: Lab Results  Component Value Date   WBC 11.2* 03/21/2016   HGB 11.5* 03/21/2016   HCT 33.4* 03/21/2016   MCV 86.7 03/21/2016   PLT 206 03/21/2016    Recent Labs Lab 03/19/16 2042 03/21/16 0506  HGB 12.3* 11.5*   Lab Results  Component Value Date   NA 139 03/21/2016   K 5.1 03/21/2016   CL 108 03/21/2016   CO2 26 03/21/2016   BUN 26* 03/21/2016   CREATININE 0.93 03/21/2016   Lab Results  Component Value Date   ALT 52 02/17/2016   AST 39 02/17/2016   ALKPHOS 176* 02/17/2016   BILITOT 0.8 02/17/2016   No results for input(s): APTT, INR, PTT in the last 168 hours. Assessment/Plan: Mr. Tetrault is a 82 y.o. male seen for weight loss.   1. Unintentional weight loss-may be related to his dental work but given family history of colon cancer in his sister w/ minimally  elevated CEA would recommend colonoscopy when more stable from respiratory/cardiac standpoint. Also needs EGD at time of colonoscopy. On Eliquis so not candidate while in patient. Will need to plan as outpatient and verify we can hold Eliquis. Will need to completely recover from respiratory standpoint from pneumonia. TSH normal. No GI symptoms except anorexia. Denies new meds at onset.   2. Constipation-controlled w/ stool softener which he should continue at discharge.   Recommendations:  1. EGD/colonsocopy as outpatient-will need to be able to hold anti-coagulation.   Thank you for the consult. Please call with questions or concerns.  *Case discussed w/ Dr. Candace Cruise.    Ronney Asters, PA-C [place]

## 2016-03-21 NOTE — Care Management (Signed)
Patrick Harding on home health referral needs- SN PT OT Aide and SW.  Have left message for attending regarding need for script for wheelchair and tub bench

## 2016-03-22 LAB — CBC
HCT: 33.3 % — ABNORMAL LOW (ref 40.0–52.0)
Hemoglobin: 11.3 g/dL — ABNORMAL LOW (ref 13.0–18.0)
MCH: 29.1 pg (ref 26.0–34.0)
MCHC: 34.1 g/dL (ref 32.0–36.0)
MCV: 85.2 fL (ref 80.0–100.0)
PLATELETS: 217 10*3/uL (ref 150–440)
RBC: 3.9 MIL/uL — AB (ref 4.40–5.90)
RDW: 15.3 % — ABNORMAL HIGH (ref 11.5–14.5)
WBC: 9.7 10*3/uL (ref 3.8–10.6)

## 2016-03-22 LAB — BASIC METABOLIC PANEL
Anion gap: 5 (ref 5–15)
BUN: 23 mg/dL — ABNORMAL HIGH (ref 6–20)
CALCIUM: 9.4 mg/dL (ref 8.9–10.3)
CHLORIDE: 106 mmol/L (ref 101–111)
CO2: 28 mmol/L (ref 22–32)
CREATININE: 0.82 mg/dL (ref 0.61–1.24)
Glucose, Bld: 93 mg/dL (ref 65–99)
Potassium: 4.1 mmol/L (ref 3.5–5.1)
SODIUM: 139 mmol/L (ref 135–145)

## 2016-03-22 MED ORDER — DIPHENHYDRAMINE HCL 25 MG PO CAPS: 25.0000 mg | ORAL_CAPSULE | Freq: Three times a day (TID) | ORAL | Status: DC | PRN

## 2016-03-22 MED ORDER — LEVOFLOXACIN 750 MG PO TABS: 750.0000 mg | ORAL_TABLET | Freq: Every day | ORAL | Status: DC

## 2016-03-22 MED ORDER — ENSURE ENLIVE PO LIQD: 237.0000 mL | Freq: Three times a day (TID) | ORAL | Status: DC

## 2016-03-22 MED ORDER — DOCUSATE SODIUM 100 MG PO CAPS: 100.0000 mg | ORAL_CAPSULE | Freq: Two times a day (BID) | ORAL | Status: DC

## 2016-03-22 NOTE — Care Management (Signed)
Patient declined tub bench. RNCM signing off

## 2016-03-22 NOTE — Discharge Summary (Signed)
[place] [facility] [place] NAME: Patrick Harding    MR#:  [id number]  DATE OF BIRTH:  November 05, 1934  DATE OF ADMISSION:  03/19/2016 ADMITTING PHYSICIAN: Harrie Foreman, MD  DATE OF DISCHARGE: 03/22/16 PRIMARY CARE PHYSICIAN: Casilda Carls, MD    ADMISSION DIAGNOSIS:  Weakness [R53.1] Renal insufficiency [N28.9] CAP (community acquired pneumonia) [J18.9] Failure to thrive (0-17) [R62.51] Atrial fibrillation with RVR ([place]) [I48.91]  DISCHARGE DIAGNOSIS:  Active Problems:   CAP (community acquired pneumonia)   Protein-calorie malnutrition, severe   Pressure ulcer Failure to thrive Thoracic aortic aneurysm  SECONDARY DIAGNOSIS:   Past Medical History  Diagnosis Date  . Hyperlipidemia   . Anemia   . Vitamin D deficiency   . Hypertension   . Renal insufficiency   . Erectile dysfunction   . Arrhythmia   . Atrial fibrillation [facility])     HOSPITAL COURSE:   This is a 82 year old male admitted for atrial fibrillation with rapid ventricular response as well as multifocal pneumonia.  # . Multifocal pneumonia: Clinically improving. Continue levofloxacin and follow up on the sputum culture and sensitivity if collected. Blood cultures with coag-negative staph aureus   #Atrial fibrillation: Rapid ventricular response controlled with Cardizem.  continue to monitor on telemetry. 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 Appreciate Vascular surgery recommendations. Needs regular outpatient follow-up for close monitoring EVERY 6 MONS  # unintentional weight loss: May be secondary to recent tooth extraction, reports 10 teeth got extracted at the same time since then he was unable to eat properly The patient has not had a colonoscopy. CEA is elevated. No colonic, prostate or liver lesions seen on CT scan. Dietary consult. Provide nutrition supplements. GI Is recommending outpatient  follow-up in a week as the patient is on Eliquis, planning to do outpatient  Procedure in 2 weeks  #. Chronic kidney disease: Stage III; avoid nephrotoxic agents. Hydrate with intravenous fluid  #. Essential hypertension: Continue metoprolol and diltiazem  #. Right-sided weakness: Mostly right lower extremity. The patient states that this is an acute on chronic problem. He is anticoagulated but risk of stroke still exists. He was previously scheduled for an outpatient MRI of the brain -no acute findings. Continue PT at home   8. Case management consult: The patient's wife has been his primary caregiver but she is now unable to help with transfers due to recent back surgery. He has physical therapy as an outpatient that has been unable to obtain durable medical equipment for help at home. We will arrange DME, home health, discussed with social worker  9. DVT prophylaxis: As above  10. GI prophylaxis: None     DISCHARGE CONDITIONS:  fair  CONSULTS OBTAINED:  Treatment Team:  Algernon Huxley, MD   PROCEDURES none  DRUG ALLERGIES:   Allergies  Allergen Reactions  . Aspirin Itching  . Penicillins Hives, Itching and Other (See Comments)    Has patient had a PCN reaction causing immediate rash, facial/tongue/throat swelling, SOB or lightheadedness with hypotension: No Has patient had a PCN reaction causing severe rash involving mucus membranes or skin necrosis: No Has patient had a PCN reaction that required hospitalization No Has patient had a PCN reaction occurring within the last 10 years: No If all of the above answers are "NO", then may proceed with Cephalosporin use.    DISCHARGE MEDICATIONS:   Current Discharge Medication List    START taking these medications  Details  diphenhydrAMINE (BENADRYL) 25 mg capsule Take 1 capsule (25 mg total) by mouth every 8 (eight) hours as needed for itching. Qty: 30 capsule, Refills: 0    docusate sodium (COLACE) 100 MG capsule Take 1  capsule (100 mg total) by mouth 2 (two) times daily. Qty: 10 capsule, Refills: 0    feeding supplement, ENSURE ENLIVE, (ENSURE ENLIVE) LIQD Take 237 mLs by mouth 3 (three) times daily between meals. Qty: 90 Bottle, Refills: 0    levofloxacin (LEVAQUIN) 750 MG tablet Take 1 tablet (750 mg total) by mouth daily. Qty: 5 tablet, Refills: 0      CONTINUE these medications which have NOT CHANGED   Details  acetaminophen (TYLENOL) 500 MG tablet Take 1,000 mg by mouth every 6 (six) hours as needed for mild pain or headache.    albuterol (PROVENTIL HFA;VENTOLIN HFA) 108 (90 Base) MCG/ACT inhaler Inhale 2 puffs into the lungs every 6 (six) hours as needed for wheezing or shortness of breath. Qty: 1 Inhaler, Refills: 2    apixaban (ELIQUIS) 5 MG TABS tablet Take 1 tablet (5 mg total) by mouth 2 (two) times daily. Qty: 60 tablet, Refills: 3    diltiazem (CARDIZEM CD) 180 MG 24 hr capsule Take 180 mg by mouth daily.    metoprolol (LOPRESSOR) 50 MG tablet Take 1 tablet (50 mg total) by mouth 2 (two) times daily. Qty: 60 tablet, Refills: 2    Multiple Vitamin (MULTIVITAMIN WITH MINERALS) TABS tablet Take 1 tablet by mouth daily.    omega-3 acid ethyl esters (LOVAZA) 1 g capsule Take 1 g by mouth daily. Reported on 03/12/2016    torsemide (DEMADEX) 20 MG tablet Take 20 mg by mouth daily.         DISCHARGE INSTRUCTIONS:   Follow-up with primary care physician in a week Follow-up with Dr. Lucky Cowboy in 6 months for thoracic aortic aneurysm follow-up Follow-up with Dr. Candace Cruise GI in a week  DIET:  Cardiac diet  DISCHARGE CONDITION:  Fair  ACTIVITY:  Activity as tolerated per PT   OXYGEN:  Home Oxygen: No.   Oxygen Delivery: room air  DISCHARGE LOCATION:  home   If you experience worsening of your admission symptoms, develop shortness of breath, life threatening emergency, suicidal or homicidal thoughts you must seek medical attention immediately by calling 911 or calling your MD immediately   if symptoms less severe.  You Must read complete instructions/literature along with all the possible adverse reactions/side effects for all the Medicines you take and that have been prescribed to you. Take any new Medicines after you have completely understood and accpet all the possible adverse reactions/side effects.   Please note  You were cared for by a hospitalist during your hospital stay. If you have any questions about your discharge medications or the care you received while you were in the hospital after you are discharged, you can call the unit and asked to speak with the hospitalist on call if the hospitalist that took care of you is not available. Once you are discharged, your primary care physician will handle any further medical issues. Please note that NO REFILLS for any discharge medications will be authorized once you are discharged, as it is imperative that you return to your primary care physician (or establish a relationship with a primary care physician if you do not have one) for your aftercare needs so that they can reassess your need for medications and monitor your lab values.     Today  Chief Complaint  Patient presents with  . Weakness   Patient is feeling fine. Denies any chest pain or shortness of breath. Cough  is getting better  ROS:  CONSTITUTIONAL: Denies fevers, chills. Denies any fatigue, weakness.  EYES: Denies blurry vision, double vision, eye pain. EARS, NOSE, THROAT: Denies tinnitus, ear pain, hearing loss. RESPIRATORY: Improving cough, denies wheezing, shortness of breath.  CARDIOVASCULAR: Denies chest pain, palpitations, edema.  GASTROINTESTINAL: Denies nausea, vomiting, diarrhea, abdominal pain. Denies bright red blood per rectum. GENITOURINARY: Denies dysuria, hematuria. ENDOCRINE: Denies nocturia or thyroid problems. HEMATOLOGIC AND LYMPHATIC: Denies easy bruising or bleeding. SKIN: Denies rash or lesion. MUSCULOSKELETAL: Denies pain in neck,  back, shoulder, knees, hips or arthritic symptoms.  NEUROLOGIC: Denies paralysis, paresthesias.  PSYCHIATRIC: Denies anxiety or depressive symptoms.   VITAL SIGNS:  Blood pressure 127/57, pulse 91, temperature 97.8 F (36.6 C), temperature source Oral, resp. rate 20, height '6\' 1"'$  (1.854 m), weight 57.153 kg (126 lb), SpO2 100 %.  I/O:    Intake/Output Summary (Last 24 hours) at 03/22/16 1216 Last data filed at 03/22/16 0900  Gross per 24 hour  Intake   1526 ml  Output   2300 ml  Net   -774 ml    PHYSICAL EXAMINATION:  GENERAL:  82 y.o.-year-old patient lying in the bed with no acute distress.  EYES: Pupils equal, round, reactive to light and accommodation. No scleral icterus. Extraocular muscles intact.  HEENT: Head atraumatic, normocephalic. Oropharynx and nasopharynx clear.  NECK:  Supple, no jugular venous distention. No thyroid enlargement, no tenderness.  LUNGS: Normal breath sounds bilaterally, no wheezing, rales,rhonchi or crepitation. No use of accessory muscles of respiration.  CARDIOVASCULAR: S1, S2 normal. No murmurs, rubs, or gallops.  ABDOMEN: Soft, non-tender, non-distended. Bowel sounds present. No organomegaly or mass.  EXTREMITIES: No pedal edema, cyanosis, or clubbing.  NEUROLOGIC: Cranial nerves II through XII are intact. Muscle strength 5/5 in all extremities. Sensation intact. Gait not checked.  PSYCHIATRIC: The patient is alert and oriented x 3.  SKIN: No obvious rash, lesion, or ulcer.   DATA REVIEW:   CBC  Recent Labs Lab 03/22/16 0532  WBC 9.7  HGB 11.3*  HCT 33.3*  PLT 217    Chemistries   Recent Labs Lab 03/22/16 0532  NA 139  K 4.1  CL 106  CO2 28  GLUCOSE 93  BUN 23*  CREATININE 0.82  CALCIUM 9.4    Cardiac Enzymes  Recent Labs Lab 03/19/16 2042  TROPONINI 0.03    Microbiology Results  Results for orders placed or performed during the hospital encounter of 03/19/16  Culture, blood (routine x 2)     Status: None  (Preliminary result)   Collection Time: 03/20/16  1:28 AM  Result Value Ref Range Status   Specimen Description BLOOD LEFT FATTY CASTS  Final   Special Requests   Final    BOTTLES DRAWN AEROBIC AND ANAEROBIC  8CCAERO, [place]   Culture NO GROWTH 2 DAYS  Final   Report Status PENDING  Incomplete  Culture, blood (routine x 2)     Status: None (Preliminary result)   Collection Time: 03/20/16  1:29 AM  Result Value Ref Range Status   Specimen Description BLOOD RIGHT ASSIST CONTROL  Final   Special Requests   Final    BOTTLES DRAWN AEROBIC AND ANAEROBIC  6CCAERO, 5CCANA   Culture  Setup Time   Final    GRAM POSITIVE COCCI IN CLUSTERS AEROBIC BOTTLE ONLY CRITICAL RESULT CALLED TO, READ BACK  BY AND VERIFIED WITH: NATE COOKSON 03/20/16 AT 2315 KLK    Culture   Final    COAGULASE NEGATIVE STAPHYLOCOCCUS AEROBIC BOTTLE ONLY THE SIGNIFICANCE OF ISOLATING THIS ORGANISM FROM A SINGLE VENIPUNCTURE CANNOT BE PREDICTED WITHOUT FURTHER CLINICAL AND CULTURE CORRELATION. SUSCEPTIBILITIES AVAILABLE ONLY ON REQUEST.    Report Status PENDING  Incomplete  Blood Culture ID Panel (Reflexed)     Status: Abnormal   Collection Time: 03/20/16  1:29 AM  Result Value Ref Range Status   Enterococcus species NOT DETECTED NOT DETECTED Final   Vancomycin resistance NOT DETECTED NOT DETECTED Final   Listeria monocytogenes NOT DETECTED NOT DETECTED Final   Staphylococcus species DETECTED (A) NOT DETECTED Final    Comment: Called to and read back by Jorje Guild at 2315 03/20/16 KLK.   Staphylococcus aureus NOT DETECTED NOT DETECTED Final   Methicillin resistance NOT DETECTED NOT DETECTED Final   Streptococcus species NOT DETECTED NOT DETECTED Final   Streptococcus agalactiae NOT DETECTED NOT DETECTED Final   Streptococcus pneumoniae NOT DETECTED NOT DETECTED Final   Streptococcus pyogenes NOT DETECTED NOT DETECTED Final   Acinetobacter baumannii NOT DETECTED NOT DETECTED Final   Enterobacteriaceae species NOT  DETECTED NOT DETECTED Final   Enterobacter cloacae complex NOT DETECTED NOT DETECTED Final   Escherichia coli NOT DETECTED NOT DETECTED Final   Klebsiella oxytoca NOT DETECTED NOT DETECTED Final   Klebsiella pneumoniae NOT DETECTED NOT DETECTED Final   Proteus species NOT DETECTED NOT DETECTED Final   Serratia marcescens NOT DETECTED NOT DETECTED Final   Carbapenem resistance NOT DETECTED NOT DETECTED Final   Haemophilus influenzae NOT DETECTED NOT DETECTED Final   Neisseria meningitidis NOT DETECTED NOT DETECTED Final   Pseudomonas aeruginosa NOT DETECTED NOT DETECTED Final   Candida albicans NOT DETECTED NOT DETECTED Final   Candida glabrata NOT DETECTED NOT DETECTED Final   Candida krusei NOT DETECTED NOT DETECTED Final   Candida parapsilosis NOT DETECTED NOT DETECTED Final   Candida tropicalis NOT DETECTED NOT DETECTED Final    RADIOLOGY:  Dg Chest 2 View  03/19/2016  CLINICAL DATA:  Cough for 3 weeks.  Weakness. EXAM: CHEST  2 VIEW COMPARISON:  February 17, 2016 FINDINGS: There is no edema or consolidation. There is a nodular opacity in the right upper lobe measuring 9 x 6 mm. Heart size and pulmonary vascularity are normal. No adenopathy. There is atherosclerotic change in the aorta. No adenopathy. IMPRESSION: 9 x 6 mm nodular opacity right upper lobe. Advise noncontrast enhanced chest CT to further assess. No edema or consolidation. Electronically Signed   By: Lowella Grip III M.D.   On: 03/19/2016 22:31   Ct Chest Wo Contrast  03/20/2016  CLINICAL DATA:  Acute onset of hypotension and generalized weakness. Decreased oral intake. Cough. Evaluate nodule noted on chest radiograph. Initial encounter. EXAM: CT CHEST WITHOUT CONTRAST TECHNIQUE: Multidetector CT imaging of the chest was performed following the standard protocol without IV contrast. COMPARISON:  Chest radiograph performed earlier today at 10:20 p.m. FINDINGS: Patchy nodular airspace opacities are noted within the right upper  and middle lobes, and minimally within the right lower lobe, compatible with multifocal pneumonia, possibly atypical in nature. Minimal left basilar atelectasis and scarring is noted. No pleural effusion or pneumothorax is seen. No dominant masses are identified. Diffuse coronary artery calcifications are seen. There is aneurysmal dilatation of the ascending thoracic aorta to 4.9 cm in AP dimension, and aneurysmal dilatation of the descending thoracic aorta to 4.2 cm in AP dimension.  No pericardial effusion is identified. No mediastinal lymphadenopathy is seen. The great vessels are grossly unremarkable. The visualized portions of thyroid gland are unremarkable. No axillary lymphadenopathy is seen. The visualized portions of the liver and spleen are grossly unremarkable in appearance. The visualized portions of the gallbladder, pancreas, adrenal glands and kidneys are within normal limits. No acute osseous abnormalities are identified. IMPRESSION: 1. Patchy nodular airspace opacities within the right middle and upper lung lobes, and minimally within the right lower lung lobe, compatible with multifocal pneumonia, possibly atypical in nature. This corresponds to the finding on chest radiograph. 2. Minimal left basilar atelectasis and scarring noted. 3. Diffuse coronary artery calcifications seen. 4. Aneurysmal dilatation of the ascending thoracic aorta to 4.9 cm in AP dimension, and at the descending thoracic aorta to 4.2 cm in AP dimension. Recommend semi-annual imaging followup by CTA or MRA and referral to cardiothoracic surgery if not already obtained. This recommendation follows 2010 ACCF/AHA/AATS/ACR/ASA/SCA/SCAI/SIR/STS/SVM Guidelines for the Diagnosis and Management of Patients With Thoracic Aortic Disease. Circulation. 2010; 121: Y195-K932 Electronically Signed   By: Garald Balding M.D.   On: 03/20/2016 00:40   Mr Jeri Cos IZ Contrast  03/20/2016  CLINICAL DATA:  Atrial fibrillation.  Weakness. EXAM: MRI  HEAD WITHOUT AND WITH CONTRAST TECHNIQUE: Multiplanar, multiecho pulse sequences of the brain and surrounding structures were obtained without and with intravenous contrast. CONTRAST:  77m MULTIHANCE GADOBENATE DIMEGLUMINE 529 MG/ML IV SOLN COMPARISON:  Head CT 07/27/2005 FINDINGS: Calvarium and upper cervical spine: Heterogeneous marrow in the upper cervical spine and clivus without focal masslike lesion. This may be related to patient's history of chronic renal insufficiency. There is no historical indication of malignancy history. Remote right zygomatic arch and orbital rim repair for tripod fracture. Orbits: Left cataract resection. Sinuses and Mastoids: Clear. Brain: No acute abnormality such as acute infarct, hemorrhage, hydrocephalus, or mass lesion. No evidence of large vessel occlusion. Chronic small vessel disease with mild white matter ischemic gliosis. Small area of remote cortically based infarct in the posterior left frontal lobe. Small-vessel remote infarct in the peripheral left cerebellum. Cortical atrophy most notable at the vertex. IMPRESSION: 1. No acute finding, including infarct. 2. Atrophy and chronic ischemic injury as described above. Electronically Signed   By: JMonte FantasiaM.D.   On: 03/20/2016 16:28    EKG:   Orders placed or performed during the hospital encounter of 03/19/16  . ED EKG  . ED EKG  . EKG 12-Lead  . EKG 12-Lead      Management plans discussed with the patient, family and they are in agreement.  CODE STATUS:     Code Status Orders        Start     Ordered   03/20/16 0321  Full code   Continuous     03/20/16 0320    Code Status History    Date Active Date Inactive Code Status Order ID Comments User Context   This patient has a current code status but no historical code status.      TOTAL TIME TAKING CARE OF THIS PATIENT: 45 minutes.    '@[facility]'$ @  on 03/22/2016 at 12:16 PM  Between 7am to 6pm - Pager - [phone] After 6pm go to  [email or web address] - password [email or web address]  Office  [phone] CC: Primary care physician; FCasilda Carls MD

## 2016-03-22 NOTE — Progress Notes (Signed)
Antibiotic IV to Oral Route Change Policy  RECOMMENDATION: This patient is receiving levofloxacin by the intravenous route.  Based on criteria approved by the Pharmacy and Therapeutics Committee, the antibiotic(s) is/are being converted to the equivalent oral dose form(s).   DESCRIPTION: These criteria include:  Patient being treated for a respiratory tract infection, urinary tract infection, cellulitis or clostridium difficile associated diarrhea if on metronidazole  The patient is not neutropenic and does not exhibit a GI malabsorption state  The patient is eating (either orally or via tube) and/or has been taking other orally administered medications for a least 24 hours  The patient is improving clinically and has a Tmax < 100.5  If you have questions about this conversion, please contact the Pharmacy Department  '[]'$   [phone] )  [facility] '[x]'$   [phone] )  [facility] '[]'$   [phone] )  [facility] '[]'$   [phone] )  [facility] '[]'$   [phone] )  [place], PharmD Clinical Pharmacist 03/22/2016

## 2016-03-22 NOTE — Care Management (Signed)
Plan for patient to discharge today.  [facility] has placed resumption of home health orders.  I have notified Tim with [facility] that  Patient will discharge today.  MD has ordered standard WC and tub bench.  I have contacted [facility] with [facility].  Wheelchair to be delivered to room.  Out of pocket cost $50 for tub bench.  Colletta Maryland with [facility] will follow up with patient and family to see if they would like to purchase the tub bench.

## 2016-03-22 NOTE — Progress Notes (Signed)
Pt discharged to home. IV site removed. Concerns addressed. Pt anxious to leave, requested to speak to Dr. Margaretmary Eddy again, but then declined on talking to MD and was ready to leave. Wheelchair was delivered to pt.  Prescription given to pt. Pt to call for additional follow-up being that MD office was closed at the time and pt did not want to wait. Pt discharged via nursing staff.

## 2016-03-22 NOTE — Discharge Instructions (Signed)
Follow-up with primary care physician in a week Follow-up with Dr. Lucky Cowboy in 6 months for thoracic aortic aneurysm follow-up Follow-up with Dr. Candace Cruise GI in a week Diet cardiac Activity as tolerated as recommended by home health with DME

## 2016-03-23 LAB — CULTURE, BLOOD (ROUTINE X 2)

## 2016-03-25 LAB — CULTURE, BLOOD (ROUTINE X 2): Culture: NO GROWTH

## 2016-03-27 ENCOUNTER — Encounter: Payer: Self-pay | Admitting: Emergency Medicine

## 2016-03-27 ENCOUNTER — Emergency Department: Payer: Medicare Other

## 2016-03-27 ENCOUNTER — Observation Stay
Admission: EM | Admit: 2016-03-27 | Discharge: 2016-03-28 | Disposition: A | Discharge status: Home / Self Care | Payer: Medicare Other | Attending: Internal Medicine | Admitting: Internal Medicine

## 2016-03-27 DIAGNOSIS — Z87891 Personal history of nicotine dependence: Secondary | ICD-10-CM | POA: Insufficient documentation

## 2016-03-27 DIAGNOSIS — I4819 Other persistent atrial fibrillation: Secondary | ICD-10-CM | POA: Diagnosis present

## 2016-03-27 DIAGNOSIS — N179 Acute kidney failure, unspecified: Principal | ICD-10-CM | POA: Insufficient documentation

## 2016-03-27 DIAGNOSIS — E860 Dehydration: Secondary | ICD-10-CM | POA: Insufficient documentation

## 2016-03-27 DIAGNOSIS — J189 Pneumonia, unspecified organism: Secondary | ICD-10-CM | POA: Diagnosis not present

## 2016-03-27 DIAGNOSIS — I4891 Unspecified atrial fibrillation: Secondary | ICD-10-CM | POA: Insufficient documentation

## 2016-03-27 DIAGNOSIS — L89152 Pressure ulcer of sacral region, stage 2: Secondary | ICD-10-CM | POA: Insufficient documentation

## 2016-03-27 DIAGNOSIS — Z9889 Other specified postprocedural states: Secondary | ICD-10-CM | POA: Diagnosis not present

## 2016-03-27 DIAGNOSIS — Z8261 Family history of arthritis: Secondary | ICD-10-CM | POA: Insufficient documentation

## 2016-03-27 DIAGNOSIS — D72829 Elevated white blood cell count, unspecified: Secondary | ICD-10-CM | POA: Diagnosis present

## 2016-03-27 DIAGNOSIS — I959 Hypotension, unspecified: Secondary | ICD-10-CM | POA: Insufficient documentation

## 2016-03-27 DIAGNOSIS — Z7901 Long term (current) use of anticoagulants: Secondary | ICD-10-CM | POA: Insufficient documentation

## 2016-03-27 DIAGNOSIS — I10 Essential (primary) hypertension: Secondary | ICD-10-CM | POA: Diagnosis not present

## 2016-03-27 DIAGNOSIS — Z880 Allergy status to penicillin: Secondary | ICD-10-CM | POA: Insufficient documentation

## 2016-03-27 DIAGNOSIS — Z886 Allergy status to analgesic agent status: Secondary | ICD-10-CM | POA: Diagnosis not present

## 2016-03-27 DIAGNOSIS — E785 Hyperlipidemia, unspecified: Secondary | ICD-10-CM | POA: Diagnosis not present

## 2016-03-27 DIAGNOSIS — Z79899 Other long term (current) drug therapy: Secondary | ICD-10-CM | POA: Insufficient documentation

## 2016-03-27 DIAGNOSIS — L8990 Pressure ulcer of unspecified site, unspecified stage: Secondary | ICD-10-CM | POA: Diagnosis present

## 2016-03-27 DIAGNOSIS — R531 Weakness: Secondary | ICD-10-CM | POA: Diagnosis not present

## 2016-03-27 LAB — CBC WITH DIFFERENTIAL/PLATELET
Basophils Absolute: 0 10*3/uL (ref 0–0.1)
Basophils Relative: 0 %
Eosinophils Absolute: 0.4 10*3/uL (ref 0–0.7)
Eosinophils Relative: 3 %
HCT: 36.6 % — ABNORMAL LOW (ref 40.0–52.0)
HEMOGLOBIN: 12.3 g/dL — AB (ref 13.0–18.0)
LYMPHS ABS: 2.3 10*3/uL (ref 1.0–3.6)
LYMPHS PCT: 19 %
MCH: 29.2 pg (ref 26.0–34.0)
MCHC: 33.6 g/dL (ref 32.0–36.0)
MCV: 87 fL (ref 80.0–100.0)
Monocytes Absolute: 1.3 10*3/uL — ABNORMAL HIGH (ref 0.2–1.0)
Monocytes Relative: 11 %
NEUTROS ABS: 8.3 10*3/uL — AB (ref 1.4–6.5)
NEUTROS PCT: 67 %
Platelets: 246 10*3/uL (ref 150–440)
RBC: 4.21 MIL/uL — AB (ref 4.40–5.90)
RDW: 15.6 % — ABNORMAL HIGH (ref 11.5–14.5)
WBC: 12.4 10*3/uL — AB (ref 3.8–10.6)

## 2016-03-27 LAB — URINALYSIS COMPLETE WITH MICROSCOPIC (ARMC ONLY)
Bacteria, UA: NONE SEEN
Bilirubin Urine: NEGATIVE
GLUCOSE, UA: NEGATIVE mg/dL
HGB URINE DIPSTICK: NEGATIVE
KETONES UR: NEGATIVE mg/dL
LEUKOCYTES UA: NEGATIVE
NITRITE: NEGATIVE
PROTEIN: NEGATIVE mg/dL
RBC / HPF: NONE SEEN RBC/hpf (ref 0–5)
SPECIFIC GRAVITY, URINE: 1.009 (ref 1.005–1.030)
pH: 6 (ref 5.0–8.0)

## 2016-03-27 LAB — BASIC METABOLIC PANEL
Anion gap: 10 (ref 5–15)
BUN: 36 mg/dL — AB (ref 6–20)
CHLORIDE: 98 mmol/L — AB (ref 101–111)
CO2: 30 mmol/L (ref 22–32)
Calcium: 10 mg/dL (ref 8.9–10.3)
Creatinine, Ser: 1.55 mg/dL — ABNORMAL HIGH (ref 0.61–1.24)
GFR calc Af Amer: 47 mL/min — ABNORMAL LOW (ref >60)
GFR calc non Af Amer: 41 mL/min — ABNORMAL LOW (ref >60)
GLUCOSE: 95 mg/dL (ref 65–99)
POTASSIUM: 4.8 mmol/L (ref 3.5–5.1)
Sodium: 138 mmol/L (ref 135–145)

## 2016-03-27 LAB — LACTIC ACID, PLASMA: LACTIC ACID, VENOUS: 1.3 mmol/L (ref 0.5–2.0)

## 2016-03-27 MED ORDER — SODIUM CHLORIDE 0.9 % IV BOLUS (SEPSIS)
1000.0000 mL | Freq: Once | INTRAVENOUS | Status: AC
Start: 2016-03-27 — End: 2016-03-27
  Administered 2016-03-27: 1000 mL via INTRAVENOUS

## 2016-03-27 MED ORDER — LEVOFLOXACIN IN D5W 750 MG/150ML IV SOLN
750.0000 mg | Freq: Once | INTRAVENOUS | Status: AC
  Administered 2016-03-27: 750 mg via INTRAVENOUS
  Filled 2016-03-27: qty 150

## 2016-03-27 NOTE — ED Notes (Signed)
Pt to ed from [facility] for low blood pressure.  Pt reports weakness.

## 2016-03-27 NOTE — ED Notes (Addendum)
Pt has eraser size pink area to sacrum that is present on arrival to ER. Pt aware of area, abdominal pad placed over are for protection while pt in ER. No sacral pad available at this time.

## 2016-03-27 NOTE — ED Notes (Signed)
Dr Archie Balboa in with pt now

## 2016-03-27 NOTE — ED Notes (Signed)
Pt refused urine collection, states he is here to get fluid and go home.

## 2016-03-27 NOTE — ED Notes (Signed)
Pt given snack. 

## 2016-03-27 NOTE — ED Provider Notes (Signed)
[facility] Emergency Department Provider Note    ____________________________________________  Time seen: ~1625  I have reviewed the triage vital signs and the nursing notes.   HISTORY  Chief Complaint Weakness   History limited by: Not Limited   HPI Patrick Harding is a 82 y.o. male who presents to the emergency department sent from [facility] today because of low blood pressure.The patient states that he has felt weak since discharge. He denies any new cough or shortness of breath. Denies any fevers at home. Family is unsure why he was at clinic. Sounds like it might have been for an upper GI study.    Past Medical History  Diagnosis Date  . Hyperlipidemia   . Anemia   . Vitamin D deficiency   . Hypertension   . Renal insufficiency   . Erectile dysfunction   . Arrhythmia   . Atrial fibrillation [facility])     Patient Active Problem List   Diagnosis Date Noted  . Pressure ulcer 03/21/2016  . CAP (community acquired pneumonia) 03/20/2016  . Protein-calorie malnutrition, severe 03/20/2016  . Speech and language deficits 03/12/2016  . Dysarthria due to recent stroke 03/12/2016  . Stroke determined by clinical assessment ([place]) 03/12/2016  . Lumbar foraminal stenosis (Left Moderate-severe L4-5) (Bilateral Moderate L5-S1) 03/12/2016  . Abnormal MRI, lumbar spine (2012) 01/24/2016  . Chronic low back pain (Location of Secondary source of pain) (Bilateral) (R>L) 01/24/2016  . Chronic lower extremity radicular pain (Location of Primary Source of Pain) (Right) (L3/L4 dermatome) 01/24/2016  . Chronic lumbar radicular pain (Location of Primary Source of Pain) (Right) (L3/L4 dermatome) 01/24/2016  . Chronic pain 01/24/2016  . Long term current use of anticoagulant therapy (Eliquis) 01/24/2016  . Long-term use of high-risk medication 01/24/2016  . 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 Lower extremity weakness (Right)  01/24/2016  . Abnormal loss of weight 12/26/2015  . 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 Lung mass 12/26/2015  . Aortic aneurysm ([place]) 12/26/2015  . Acute on chronic systolic heart failure ([place]) 12/26/2015  . Spinal stenosis, lumbar region, with neurogenic claudication 08/10/2015  . Lumbar facet syndrome 08/10/2015  . Sacroiliac joint dysfunction 08/10/2015  . Atrial fibrillation, unspecified 04/14/2015  . Essential hypertension 04/14/2015  . Atrial fibrillation ([place]) 04/14/2015    Past Surgical History  Procedure Laterality Date  . Head surgery Left     Current Outpatient Rx  Name  Route  Sig  Dispense  Refill  . acetaminophen (TYLENOL) 500 MG tablet   Oral   Take 1,000 mg by mouth every 6 (six) hours as needed for mild pain or headache.         . albuterol (PROVENTIL HFA;VENTOLIN HFA) 108 (90 Base) MCG/ACT inhaler   Inhalation   Inhale 2 puffs into the lungs every 6 (six) hours as needed for wheezing or shortness of breath.   1 Inhaler   2   . apixaban (ELIQUIS) 5 MG TABS tablet   Oral   Take 1 tablet (5 mg total) by mouth 2 (two) times daily.   60 tablet   3   . diltiazem (CARDIZEM CD) 180 MG 24 hr capsule   Oral   Take 180 mg by mouth daily.         . diphenhydrAMINE (BENADRYL) 25 mg capsule   Oral   Take 1 capsule (25 mg total) by mouth every 8 (eight) hours as needed for itching.   30 capsule   0   .  docusate sodium (COLACE) 100 MG capsule   Oral   Take 1 capsule (100 mg total) by mouth 2 (two) times daily.   10 capsule   0   . feeding supplement, ENSURE ENLIVE, (ENSURE ENLIVE) LIQD   Oral   Take 237 mLs by mouth 3 (three) times daily between meals.   90 Bottle   0   . levofloxacin (LEVAQUIN) 750 MG tablet   Oral   Take 1 tablet (750 mg total) by mouth daily.   5 tablet   0   . metoprolol (LOPRESSOR) 50 MG tablet   Oral   Take 1 tablet (50 mg total) by mouth 2 (two) times daily.   60 tablet   2   . Multiple Vitamin (MULTIVITAMIN WITH  MINERALS) TABS tablet   Oral   Take 1 tablet by mouth daily.         Marland Kitchen omega-3 acid ethyl esters (LOVAZA) 1 g capsule   Oral   Take 1 g by mouth daily. Reported on 03/12/2016         . torsemide (DEMADEX) 20 MG tablet   Oral   Take 20 mg by mouth daily.           Allergies Aspirin and Penicillins  Family History  Problem Relation Age of Onset  . Arthritis Father     Social History Social History  Substance Use Topics  . Smoking status: Former Smoker -- 0.50 packs/day for 20 years    Types: Cigarettes    Quit date: 10/24/2015  . Smokeless tobacco: None  . Alcohol Use: No     Comment: occasional    Review of Systems  Constitutional: Negative for fever. Cardiovascular: Negative for chest pain. Respiratory: Negative for shortness of breath. Gastrointestinal: Negative for abdominal pain, vomiting and diarrhea. Skin: Negative for rash. Neurological: Negative for headaches, focal weakness or numbness.  10-point ROS otherwise negative.  ____________________________________________   PHYSICAL EXAM:  VITAL SIGNS: ED Triage Vitals  Enc Vitals Group     BP 03/27/16 1601 96/51 mmHg     Pulse Rate 03/27/16 1601 71     Resp 03/27/16 1601 20     Temp 03/27/16 1601 97.6 F (36.4 C)     Temp Source 03/27/16 1601 Oral     SpO2 03/27/16 1601 99 %     Weight 03/27/16 1601 130 lb (58.968 kg)     Height 03/27/16 1601 6' (1.829 m)     Head Cir --      Peak Flow --      Pain Score 03/27/16 1602 0   Constitutional: Alert and oriented. Well appearing and in no distress. Eyes: Conjunctivae are normal. PERRL. Normal extraocular movements. ENT   Head: Normocephalic and atraumatic.   Nose: No congestion/rhinnorhea.   Mouth/Throat: Mucous membranes are moist.   Neck: No stridor. Hematological/Lymphatic/Immunilogical: No cervical lymphadenopathy. Cardiovascular: Tachycardic, irregularly irregular rhythm.  No murmurs, rubs, or gallops. Respiratory: Normal  respiratory effort without tachypnea nor retractions. Breath sounds are clear and equal bilaterally. No wheezes/rales/rhonchi. Gastrointestinal: Soft and nontender. No distention. Genitourinary: Deferred Musculoskeletal: Normal range of motion in all extremities. No joint effusions.  No lower extremity tenderness nor edema. Neurologic:  Normal speech and language. No gross focal neurologic deficits are appreciated.  Skin:  Skin is warm, dry and intact. No rash noted. Psychiatric: Mood and affect are normal. Speech and behavior are normal. Patient exhibits appropriate insight and judgment.  ____________________________________________    LABS (pertinent positives/negatives)  Labs Reviewed  CBC WITH DIFFERENTIAL/PLATELET - Abnormal; Notable for the following:    WBC 12.4 (*)    RBC 4.21 (*)    Hemoglobin 12.3 (*)    HCT 36.6 (*)    RDW 15.6 (*)    Neutro Abs 8.3 (*)    Monocytes Absolute 1.3 (*)    All other components within normal limits  BASIC METABOLIC PANEL - Abnormal; Notable for the following:    Chloride 98 (*)    BUN 36 (*)    Creatinine, Ser 1.55 (*)    GFR calc non Af Amer 41 (*)    GFR calc Af Amer 47 (*)    All other components within normal limits  URINALYSIS COMPLETEWITH MICROSCOPIC ([facility] ONLY) - Abnormal; Notable for the following:    Color, Urine YELLOW (*)    APPearance CLEAR (*)    Squamous Epithelial / LPF 0-5 (*)    All other components within normal limits  CULTURE, BLOOD (ROUTINE X 2)  CULTURE, BLOOD (ROUTINE X 2)  LACTIC ACID, PLASMA     ____________________________________________   EKG  I, Nance Pear, attending physician, personally viewed and interpreted this EKG  EKG Time: 1621 Rate: 77 Rhythm: atrial fibrillation Axis: normal Intervals: qtc 501 QRS: narrow, q waves V1, V2, V3 ST changes: no st elevation Impression: abnormal ekg   ____________________________________________    RADIOLOGY  CXR IMPRESSION: No active  cardiopulmonary disease  ____________________________________________   PROCEDURES  Procedure(s) performed: None  Critical Care performed: No  ____________________________________________   INITIAL IMPRESSION / ASSESSMENT AND PLAN / ED COURSE  Pertinent labs & imaging results that were available during my care of the patient were reviewed by me and considered in my medical decision making (see chart for details).  Patient presented to the emergency department today because of concerns for low blood pressure and weakness. Recently discharged from the hospital secondary to pneumonia.Has history of A. fib. Will check blood work, urine. ----------------------------------------- 7:16 PM on 03/27/2016 -----------------------------------------  At this time patient is now agreeable to give a urine sample. He would still like to hold off on antibiotics until results. He states that he does not want to and on antibiotics over what he had unless we knew what we are treating.   ----------------------------------------- 10:39 PM on 03/27/2016 -----------------------------------------  Urine came back was within normal limits. Patient still slightly tachycardic even after fluids. Will plan admission to the hospital service for acute kidney injury, dehydration. ____________________________________________   FINAL CLINICAL IMPRESSION(S) / ED DIAGNOSES  Final diagnoses:  Acute kidney injury ([place])  Leukocytosis     Nance Pear, MD 03/27/16 2239

## 2016-03-28 ENCOUNTER — Encounter: Payer: Self-pay | Admitting: Internal Medicine

## 2016-03-28 DIAGNOSIS — E785 Hyperlipidemia, unspecified: Secondary | ICD-10-CM | POA: Insufficient documentation

## 2016-03-28 DIAGNOSIS — N179 Acute kidney failure, unspecified: Secondary | ICD-10-CM | POA: Diagnosis present

## 2016-03-28 DIAGNOSIS — E860 Dehydration: Secondary | ICD-10-CM

## 2016-03-28 HISTORY — DX: Dehydration: E86.0

## 2016-03-28 LAB — BASIC METABOLIC PANEL
ANION GAP: 5 (ref 5–15)
BUN: 30 mg/dL — ABNORMAL HIGH (ref 6–20)
CO2: 29 mmol/L (ref 22–32)
Calcium: 8.7 mg/dL — ABNORMAL LOW (ref 8.9–10.3)
Chloride: 102 mmol/L (ref 101–111)
Creatinine, Ser: 1.19 mg/dL (ref 0.61–1.24)
GFR calc Af Amer: >60 mL/min (ref >60)
GFR, EST NON AFRICAN AMERICAN: 56 mL/min — AB (ref >60)
GLUCOSE: 104 mg/dL — AB (ref 65–99)
POTASSIUM: 3.5 mmol/L (ref 3.5–5.1)
Sodium: 136 mmol/L (ref 135–145)

## 2016-03-28 LAB — CBC
HEMATOCRIT: 32.1 % — AB (ref 40.0–52.0)
HEMOGLOBIN: 10.9 g/dL — AB (ref 13.0–18.0)
MCH: 29.5 pg (ref 26.0–34.0)
MCHC: 34 g/dL (ref 32.0–36.0)
MCV: 86.8 fL (ref 80.0–100.0)
Platelets: 228 10*3/uL (ref 150–440)
RBC: 3.7 MIL/uL — ABNORMAL LOW (ref 4.40–5.90)
RDW: 14.8 % — ABNORMAL HIGH (ref 11.5–14.5)
WBC: 10.3 10*3/uL (ref 3.8–10.6)

## 2016-03-28 MED ORDER — APIXABAN 5 MG PO TABS: 5.0000 mg | ORAL_TABLET | Freq: Two times a day (BID) | ORAL | Status: DC

## 2016-03-28 MED ORDER — ACETAMINOPHEN 325 MG PO TABS
650.0000 mg | ORAL_TABLET | Freq: Four times a day (QID) | ORAL | Status: DC | PRN
Start: 2016-03-28 — End: 2016-03-28

## 2016-03-28 MED ORDER — SODIUM CHLORIDE 0.9 % IV SOLN
INTRAVENOUS | Status: AC
  Administered 2016-03-28: 02:00:00 via INTRAVENOUS

## 2016-03-28 MED ORDER — ENSURE ENLIVE PO LIQD
237.0000 mL | Freq: Three times a day (TID) | ORAL | Status: DC
  Administered 2016-03-28: 237 mL via ORAL

## 2016-03-28 MED ORDER — ENSURE ENLIVE PO LIQD: 237.0000 mL | Freq: Three times a day (TID) | ORAL | Status: DC

## 2016-03-28 MED ORDER — LEVOFLOXACIN IN D5W 750 MG/150ML IV SOLN: 750.0000 mg | INTRAVENOUS | Status: DC

## 2016-03-28 MED ORDER — ACETAMINOPHEN 650 MG RE SUPP: 650.0000 mg | Freq: Four times a day (QID) | RECTAL | Status: DC | PRN

## 2016-03-28 MED ORDER — DILTIAZEM HCL ER COATED BEADS 180 MG PO CP24
180.0000 mg | ORAL_CAPSULE | Freq: Every day | ORAL | Status: DC
  Administered 2016-03-28: 180 mg via ORAL
  Filled 2016-03-28: qty 1

## 2016-03-28 MED ORDER — SODIUM CHLORIDE 0.9% FLUSH
3.0000 mL | Freq: Two times a day (BID) | INTRAVENOUS | Status: DC
  Administered 2016-03-28: 3 mL via INTRAVENOUS

## 2016-03-28 MED ORDER — ONDANSETRON HCL 4 MG PO TABS: 4.0000 mg | ORAL_TABLET | Freq: Four times a day (QID) | ORAL | Status: DC | PRN

## 2016-03-28 MED ORDER — APIXABAN 2.5 MG PO TABS
2.5000 mg | ORAL_TABLET | Freq: Two times a day (BID) | ORAL | Status: DC
  Administered 2016-03-28: 09:00:00 2.5 mg via ORAL
  Filled 2016-03-28: qty 1

## 2016-03-28 MED ORDER — METOPROLOL TARTRATE 50 MG PO TABS
50.0000 mg | ORAL_TABLET | Freq: Two times a day (BID) | ORAL | Status: DC
  Administered 2016-03-28: 50 mg via ORAL
  Filled 2016-03-28: qty 1

## 2016-03-28 MED ORDER — ONDANSETRON HCL 4 MG/2ML IJ SOLN: 4.0000 mg | Freq: Four times a day (QID) | INTRAMUSCULAR | Status: DC | PRN

## 2016-03-28 MED ORDER — TORSEMIDE 20 MG PO TABS: 20.0000 mg | ORAL_TABLET | ORAL | Status: DC

## 2016-03-28 NOTE — H&P (Signed)
[facility] [place] NAME: Patrick Harding    MR#:  [id number]  DATE OF BIRTH:  09/14/1934  DATE OF ADMISSION:  03/27/2016  PRIMARY CARE PHYSICIAN: Casilda Carls, MD   REQUESTING/REFERRING PHYSICIAN: Archie Balboa, MD  CHIEF COMPLAINT:   Chief Complaint  Patient presents with  . Weakness    HISTORY OF PRESENT ILLNESS:  Patrick Harding  is a 82 y.o. male who Was sent from clinic with low blood pressure. He was recently admitted here for 2 days with community acquired pneumonia. His visit today was follow-up visit and he was found to have low blood pressure and so was sent back over here to the hospital. He does state that he has a couple days of antibiotics left to take, but that he has had some persistent whitish sputum. However, overall he feels much better than he did when he presented with his pneumonia originally. On workup in the ED he was found to have acute kidney injury and mild leukocytosis. Hospitals were called for admission for the same.  PAST MEDICAL HISTORY:   Past Medical History  Diagnosis Date  . Hyperlipidemia   . Anemia   . Vitamin D deficiency   . Hypertension   . Renal insufficiency   . Erectile dysfunction   . Arrhythmia   . Atrial fibrillation ([place])     PAST SURGICAL HISTORY:   Past Surgical History  Procedure Laterality Date  . Head surgery Left     SOCIAL HISTORY:   Social History  Substance Use Topics  . Smoking status: Former Smoker -- 0.50 packs/day for 20 years    Types: Cigarettes    Quit date: 10/24/2015  . Smokeless tobacco: Not on file  . Alcohol Use: No     Comment: occasional    FAMILY HISTORY:   Family History  Problem Relation Age of Onset  . Arthritis Father     DRUG ALLERGIES:   Allergies  Allergen Reactions  . Aspirin Itching  . Penicillins Hives, Itching and Other (See Comments)    Has patient had a PCN reaction causing immediate rash, facial/tongue/throat swelling, SOB or  lightheadedness with hypotension: No Has patient had a PCN reaction causing severe rash involving mucus membranes or skin necrosis: No Has patient had a PCN reaction that required hospitalization No Has patient had a PCN reaction occurring within the last 10 years: No If all of the above answers are "NO", then may proceed with Cephalosporin use.    MEDICATIONS AT HOME:   Prior to Admission medications   Medication Sig Start Date End Date Taking? Authorizing Provider  acetaminophen (TYLENOL) 500 MG tablet Take 1,000 mg by mouth every 6 (six) hours as needed for mild pain or headache.   Yes Historical Provider, MD  albuterol (PROVENTIL HFA;VENTOLIN HFA) 108 (90 Base) MCG/ACT inhaler Inhale 2 puffs into the lungs every 6 (six) hours as needed for wheezing or shortness of breath. 12/07/15  Yes Daymon Larsen, MD  apixaban (ELIQUIS) 5 MG TABS tablet Take 1 tablet (5 mg total) by mouth 2 (two) times daily. 09/02/15  Yes Wellington Hampshire, MD  diltiazem (CARDIZEM CD) 180 MG 24 hr capsule Take 180 mg by mouth daily.   Yes Historical Provider, MD  diphenhydrAMINE (BENADRYL) 25 mg capsule Take 1 capsule (25 mg total) by mouth every 8 (eight) hours as needed for itching. 03/22/16  Yes Nicholes Mango, MD  docusate sodium (COLACE) 100 MG capsule Take 1 capsule (100  mg total) by mouth 2 (two) times daily. 03/22/16  Yes Nicholes Mango, MD  feeding supplement, ENSURE ENLIVE, (ENSURE ENLIVE) LIQD Take 237 mLs by mouth 3 (three) times daily between meals. 03/22/16  Yes Nicholes Mango, MD  levofloxacin (LEVAQUIN) 750 MG tablet Take 1 tablet (750 mg total) by mouth daily. 03/22/16  Yes Nicholes Mango, MD  metoprolol (LOPRESSOR) 50 MG tablet Take 1 tablet (50 mg total) by mouth 2 (two) times daily. 12/06/15  Yes Wellington Hampshire, MD  Multiple Vitamin (MULTIVITAMIN WITH MINERALS) TABS tablet Take 1 tablet by mouth daily.   Yes Historical Provider, MD  omega-3 acid ethyl esters (LOVAZA) 1 g capsule Take 1 g by mouth daily. Reported on  03/12/2016   Yes Historical Provider, MD  torsemide (DEMADEX) 20 MG tablet Take 20 mg by mouth daily.   Yes Historical Provider, MD    REVIEW OF SYSTEMS:  Review of Systems  Constitutional: Negative for fever, chills, weight loss and malaise/fatigue.  HENT: Negative for ear pain, hearing loss and tinnitus.   Eyes: Negative for blurred vision, double vision, pain and redness.  Respiratory: Positive for cough and sputum production. Negative for hemoptysis and shortness of breath.   Cardiovascular: Negative for chest pain, palpitations, orthopnea and leg swelling.  Gastrointestinal: Negative for nausea, vomiting, abdominal pain, diarrhea and constipation.  Genitourinary: Negative for dysuria, frequency and hematuria.  Musculoskeletal: Negative for back pain, joint pain and neck pain.  Skin:       No acne, rash, or lesions  Neurological: Positive for weakness. Negative for dizziness, tremors and focal weakness.  Endo/Heme/Allergies: Negative for polydipsia. Does not bruise/bleed easily.  Psychiatric/Behavioral: Negative for depression. The patient is not nervous/anxious and does not have insomnia.      VITAL SIGNS:   Filed Vitals:   03/27/16 2030 03/27/16 2222 03/27/16 2230 03/27/16 2300  BP: 106/68  107/76 107/60  Pulse:  113 75 86  Temp:      TempSrc:      Resp: '19 22 17 16  '$ Height:      Weight:      SpO2:  100% 100% 98%   Wt Readings from Last 3 Encounters:  03/27/16 58.968 kg (130 lb)  03/22/16 57.153 kg (126 lb)  03/12/16 59.421 kg (131 lb)    PHYSICAL EXAMINATION:  Physical Exam  Vitals reviewed. Constitutional: He is oriented to person, place, and time. He appears well-developed and well-nourished. No distress.  HENT:  Head: Normocephalic and atraumatic.  Mouth/Throat: Oropharynx is clear and moist.  Eyes: Conjunctivae and EOM are normal. Pupils are equal, round, and reactive to light. No scleral icterus.  Neck: Normal range of motion. Neck supple. No JVD present. No  thyromegaly present.  Cardiovascular: Normal rate, regular rhythm and intact distal pulses.  Exam reveals no gallop and no friction rub.   No murmur heard. Respiratory: Effort normal. No respiratory distress. He has no wheezes. He has no rales.  Mild coarse sounds left mid and lower lung field  GI: Soft. Bowel sounds are normal. He exhibits no distension. There is no tenderness.  Musculoskeletal: Normal range of motion. He exhibits no edema.  No arthritis, no gout  Lymphadenopathy:    He has no cervical adenopathy.  Neurological: He is alert and oriented to person, place, and time. No cranial nerve deficit.  No dysarthria, no aphasia  Skin: Skin is warm and dry. No rash noted. No erythema.  Psychiatric: He has a normal mood and affect. His behavior is normal. Judgment  and thought content normal.    LABORATORY PANEL:   CBC  Recent Labs Lab 03/27/16 1726  WBC 12.4*  HGB 12.3*  HCT 36.6*  PLT 246   ------------------------------------------------------------------------------------------------------------------  Chemistries   Recent Labs Lab 03/27/16 1726  NA 138  K 4.8  CL 98*  CO2 30  GLUCOSE 95  BUN 36*  CREATININE 1.55*  CALCIUM 10.0   ------------------------------------------------------------------------------------------------------------------  Cardiac Enzymes No results for input(s): TROPONINI in the last 168 hours. ------------------------------------------------------------------------------------------------------------------  RADIOLOGY:  Dg Chest 2 View  03/27/2016  CLINICAL DATA:  Pt to ed from [facility] for low blood pressure. Pt reports weakness several days. Pt former smoker quit 10/2015. PMH of a-fib, arrhythmia, htn, anemia EXAM: CHEST  2 VIEW COMPARISON:  03/19/2016 FINDINGS: Lungs are hyperinflated. Heart size is normal. No focal consolidations or pleural effusions. No pulmonary edema. IMPRESSION: No active cardiopulmonary disease. Electronically Signed    By: Nolon Nations M.D.   On: 03/27/2016 17:01    EKG:   Orders placed or performed during the hospital encounter of 03/19/16  . ED EKG  . ED EKG  . EKG 12-Lead  . EKG 12-Lead    IMPRESSION AND PLAN:  Principal Problem:   AKI (acute kidney injury) ([place]) - suspect prerenal injury. We'll hydrate with fluids and monitor for expected improvement, avoid nephrotoxins. Active Problems:   Essential hypertension - initially reported as low, however he had several normal readings in the ED. We'll continue fluids for hydration and continue home rate controlling medicines, although we will monitor his blood pressure closely.   Atrial fibrillation ([place]) - continue home meds as above, currently rate controlled   CAP (community acquired pneumonia) - patient still had a few doses of antibiotics left. However, with his persistent sputum production we will extend his treatment for 3 additional days that he can have a full 10 day course of antibiotics.   Pressure ulcer - wound consult, this is a mild pressure ulcer, the patient states that it is very uncomfortable.   Dehydration - fluids as above  All the records are reviewed and case discussed with ED provider. Management plans discussed with the patient and/or family.  DVT PROPHYLAXIS: Systemic anticoagulation  GI PROPHYLAXIS: None  ADMISSION STATUS: Observation  CODE STATUS: Full Code Status History    Date Active Date Inactive Code Status Order ID Comments User Context   03/20/2016  3:20 AM 03/22/2016  5:46 PM Full Code [id number]  Harrie Foreman, MD Inpatient      TOTAL TIME TAKING CARE OF THIS PATIENT: 40 minutes.    Patrick Harding [place] 03/28/2016, 12:20 AM  [facility]  Office  [phone]  CC: Primary care physician; Casilda Carls, MD

## 2016-03-28 NOTE — Discharge Instructions (Addendum)
°  DIET:  Cardiac diet  DISCHARGE CONDITION:  Stable  ACTIVITY:  Activity as tolerated  OXYGEN:  Home Oxygen: No.   Oxygen Delivery: room air  DISCHARGE LOCATION:  home   If you experience worsening of your admission symptoms, develop shortness of breath, life threatening emergency, suicidal or homicidal thoughts you must seek medical attention immediately by calling 911 or calling your MD immediately  if symptoms less severe.  You Must read complete instructions/literature along with all the possible adverse reactions/side effects for all the Medicines you take and that have been prescribed to you. Take any new Medicines after you have completely understood and accpet all the possible adverse reactions/side effects.   Please note  You were cared for by a hospitalist during your hospital stay. If you have any questions about your discharge medications or the care you received while you were in the hospital after you are discharged, you can call the unit and asked to speak with the hospitalist on call if the hospitalist that took care of you is not available. Once you are discharged, your primary care physician will handle any further medical issues. Please note that NO REFILLS for any discharge medications will be authorized once you are discharged, as it is imperative that you return to your primary care physician (or establish a relationship with a primary care physician if you do not have one) for your aftercare needs so that they can reassess your need for medications and monitor your lab values.   Take fluid pill every other day.

## 2016-03-28 NOTE — Progress Notes (Signed)
All d/c forms completed and given to pt. Pt verbalizes understanding on all d/c forms and how to take home meds. IV d/c intact. Telemetry d/c and central telemetry was called by CNA. Pt states understanding on "fluid pill" to take every other day.

## 2016-03-28 NOTE — Progress Notes (Signed)
Dr Darvin Neighbours was notified of pt's HR increasing at times to 160s when sitting up for breakfast. Cardizem and metoprolol AM dose given early to help with HR. No new orders from MD. Will monitor pt cllosely

## 2016-03-28 NOTE — ED Notes (Signed)
Pt alert and oriented X4, active, cooperative, pt in NAD. RR even and unlabored, color WNL.    

## 2016-03-28 NOTE — Care Management Obs Status (Signed)
[facility] NOTIFICATION   Patient Details  Name: Patrick Harding MRN: [id number] Date of Birth: Jun 30, 1934   Medicare Observation Status Notification Given:  Yes    Shelbie Ammons, RN 03/28/2016, 11:11 AM

## 2016-03-28 NOTE — Care Management (Signed)
Admitted to [facility] with the diagnosis of acute kidney injury. Lives with wife, Bartolo Darter [phone]). Discharged from this facility 03/22/16. Primary care physician is Dr. Rosario Jacks. Wheelchai and rolling walker in the home. No home oxygen. Nursing services, physical therapy, occupational therapy and aide services are provided by [facility].  Several falls in the home.  Shelbie Ammons RN MSN CCM Care Management [phone]

## 2016-03-28 NOTE — Consult Note (Signed)
WOC wound consult note Reason for Consult:Stage 2 pressure injury to sacrococcygeal area.  Present on admission. Recent debility with pneumonia Wound type:Stage 2 pressure injury Pressure Ulcer POA: Yes Measurement:1 cm x 0.25 cm x 0.2 cm  Wound ERX:VQMG and moist Drainage (amount, consistency, odor) Scant serous drainage.  Periwound:Slight maceration to periwound skin Dressing procedure/placement/frequency: Cleanse wound to sacrum with soap and water.  Barrier cream to nonintact skin.  Silicone foam dressing.  Change every 3 days and PRN soilage.  Will not follow at this time.  Please re-consult if needed.  Domenic Moras RN BSN [place] Pager [phone]

## 2016-03-29 NOTE — Discharge Summary (Signed)
[facility] NAME: Patrick Harding    MR#:  [id number]  DATE OF BIRTH:  [birth year]  DATE OF ADMISSION:  03/27/2016 ADMITTING PHYSICIAN: Lance Coon, MD  DATE OF DISCHARGE: 03/28/2016 11:50 AM  PRIMARY CARE PHYSICIAN: Casilda Carls, MD   ADMISSION DIAGNOSIS:  Leukocytosis [D72.829] Acute kidney injury ([place]) [N17.9]  DISCHARGE DIAGNOSIS:  Principal Problem:   AKI (acute kidney injury) ([place]) Active Problems:   Essential hypertension   Atrial fibrillation (HCC)   CAP (community acquired pneumonia)   Pressure ulcer   Dehydration   SECONDARY DIAGNOSIS:   Past Medical History  Diagnosis Date  . Hyperlipidemia   . Anemia   . Vitamin D deficiency   . Hypertension   . Renal insufficiency   . Erectile dysfunction   . Arrhythmia   . Atrial fibrillation ([place])      ADMITTING HISTORY  Patrick Harding is a 82 y.o. male who Was sent from clinic with low blood pressure. He was recently admitted here for 2 days with community acquired pneumonia. His visit today was follow-up visit and he was found to have low blood pressure and so was sent back over here to the hospital. He does state that he has a couple days of antibiotics left to take, but that he has had some persistent whitish sputum. However, overall he feels much better than he did when he presented with his pneumonia originally. On workup in the ED he was found to have acute kidney injury and mild leukocytosis. Hospitals were called for admission for the same.  HOSPITAL COURSE:   * Dehydration with acute kidney injury due to poor oral intake and being on torsemide Patient was admitted onto medical floor. His hypotension quickly resolved with IV fluid bolus. His white count was normal and was afebrile. Patient's torsemide is being changed from daily to every other day. Encouraged to drink plenty of fluids. His creatinine has returned to normal. Dehydration resolved. Blood pressure is  normal. Discharge home in a stable condition to follow-up with primary care physician.  CONSULTS OBTAINED:     DRUG ALLERGIES:   Allergies  Allergen Reactions  . Aspirin Itching  . Penicillins Hives, Itching and Other (See Comments)    Has patient had a PCN reaction causing immediate rash, facial/tongue/throat swelling, SOB or lightheadedness with hypotension: No Has patient had a PCN reaction causing severe rash involving mucus membranes or skin necrosis: No Has patient had a PCN reaction that required hospitalization No Has patient had a PCN reaction occurring within the last 10 years: No If all of the above answers are "NO", then may proceed with Cephalosporin use.    DISCHARGE MEDICATIONS:   Discharge Medication List as of 03/28/2016 11:05 AM    CONTINUE these medications which have CHANGED   Details  torsemide (DEMADEX) 20 MG tablet Take 1 tablet (20 mg total) by mouth every other day., Starting 03/28/2016, Until Discontinued, No Print      CONTINUE these medications which have NOT CHANGED   Details  acetaminophen (TYLENOL) 500 MG tablet Take 1,000 mg by mouth every 6 (six) hours as needed for mild pain or headache., Until Discontinued, Historical Med    albuterol (PROVENTIL HFA;VENTOLIN HFA) 108 (90 Base) MCG/ACT inhaler Inhale 2 puffs into the lungs every 6 (six) hours as needed for wheezing or shortness of breath., Starting 12/07/2015, Until Discontinued, Print    apixaban (ELIQUIS) 5 MG TABS tablet Take 1 tablet (5 mg total)  by mouth 2 (two) times daily., Starting 09/02/2015, Until Discontinued, Normal    diltiazem (CARDIZEM CD) 180 MG 24 hr capsule Take 180 mg by mouth daily., Until Discontinued, Historical Med    diphenhydrAMINE (BENADRYL) 25 mg capsule Take 1 capsule (25 mg total) by mouth every 8 (eight) hours as needed for itching., Starting 03/22/2016, Until Discontinued, Normal    docusate sodium (COLACE) 100 MG capsule Take 1 capsule (100 mg total) by mouth 2  (two) times daily., Starting 03/22/2016, Until Discontinued, Normal    levofloxacin (LEVAQUIN) 750 MG tablet Take 1 tablet (750 mg total) by mouth daily., Starting 03/22/2016, Until Discontinued, Print    metoprolol (LOPRESSOR) 50 MG tablet Take 1 tablet (50 mg total) by mouth 2 (two) times daily., Starting 12/06/2015, Until Discontinued, Normal    Multiple Vitamin (MULTIVITAMIN WITH MINERALS) TABS tablet Take 1 tablet by mouth daily., Until Discontinued, Historical Med    omega-3 acid ethyl esters (LOVAZA) 1 g capsule Take 1 g by mouth daily. Reported on 03/12/2016, Until Discontinued, Historical Med    feeding supplement, ENSURE ENLIVE, (ENSURE ENLIVE) LIQD Take 237 mLs by mouth 3 (three) times daily between meals., Starting 03/22/2016, Until Discontinued, Print        Today   VITAL SIGNS:  Blood pressure 121/63, pulse 69, temperature 97.9 F (36.6 C), temperature source Oral, resp. rate 18, height 6' (1.829 m), weight 58.968 kg (130 lb), SpO2 100 %.  I/O:  No intake or output data in the 24 hours ending 03/29/16 1416  PHYSICAL EXAMINATION:  Physical Exam  GENERAL:  82 y.o.-year-old patient lying in the bed with no acute distress.  LUNGS: Normal breath sounds bilaterally, no wheezing, rales,rhonchi or crepitation. No use of accessory muscles of respiration.  CARDIOVASCULAR: S1, S2 normal. No murmurs, rubs, or gallops.  ABDOMEN: Soft, non-tender, non-distended. Bowel sounds present. No organomegaly or mass.  NEUROLOGIC: Moves all 4 extremities. PSYCHIATRIC: The patient is alert and oriented x 3.  SKIN: No obvious rash, lesion, or ulcer.   DATA REVIEW:   CBC  Recent Labs Lab 03/28/16 0533  WBC 10.3  HGB 10.9*  HCT 32.1*  PLT 228    Chemistries   Recent Labs Lab 03/28/16 0533  NA 136  K 3.5  CL 102  CO2 29  GLUCOSE 104*  BUN 30*  CREATININE 1.19  CALCIUM 8.7*    Cardiac Enzymes No results for input(s): TROPONINI in the last 168 hours.  Microbiology Results   Results for orders placed or performed during the hospital encounter of 03/27/16  Blood culture (routine x 2)     Status: None (Preliminary result)   Collection Time: 03/27/16  9:35 PM  Result Value Ref Range Status   Specimen Description BLOOD BLOOD RIGHT FOREARM  Final   Special Requests BOTTLES DRAWN AEROBIC AND ANAEROBIC 5ML  Final   Culture NO GROWTH 2 DAYS  Final   Report Status PENDING  Incomplete  Blood culture (routine x 2)     Status: None (Preliminary result)   Collection Time: 03/27/16  9:43 PM  Result Value Ref Range Status   Specimen Description BLOOD RIGHT ANTECUBITAL  Final   Special Requests BOTTLES DRAWN AEROBIC AND ANAEROBIC 5ML  Final   Culture NO GROWTH 2 DAYS  Final   Report Status PENDING  Incomplete    RADIOLOGY:  Dg Chest 2 View  03/27/2016  CLINICAL DATA:  Pt to ed from [facility] for low blood pressure. Pt reports weakness several days. Pt former smoker quit  10/2015. PMH of a-fib, arrhythmia, htn, anemia EXAM: CHEST  2 VIEW COMPARISON:  03/19/2016 FINDINGS: Lungs are hyperinflated. Heart size is normal. No focal consolidations or pleural effusions. No pulmonary edema. IMPRESSION: No active cardiopulmonary disease. Electronically Signed   By: Nolon Nations M.D.   On: 03/27/2016 17:01    Follow up with PCP in 1 week.  Management plans discussed with the patient, family and they are in agreement.  CODE STATUS:  Code Status History    Date Active Date Inactive Code Status Order ID Comments User Context   03/28/2016  1:35 AM 03/28/2016  3:47 PM Full Code [id number]  Lance Coon, MD Inpatient   03/20/2016  3:20 AM 03/22/2016  5:46 PM Full Code [id number]  Harrie Foreman, MD Inpatient      TOTAL TIME TAKING CARE OF THIS PATIENT ON DAY OF DISCHARGE: more than 30 minutes.   Hillary Bow R M.D on 03/29/2016 at 2:16 PM  Between 7am to 6pm - Pager - [phone]  After 6pm go to [email or web address] - password [email or web address] [place] [facility]  Office   [phone]  CC: Primary care physician; Casilda Carls, MD  Note: This dictation was prepared with Dragon dictation along with smaller phrase technology. Any transcriptional errors that result from this process are unintentional.

## 2016-04-01 LAB — CULTURE, BLOOD (ROUTINE X 2)
CULTURE: NO GROWTH
Culture: NO GROWTH

## 2016-05-28 ENCOUNTER — Emergency Department
Admission: EM | Admit: 2016-05-28 | Discharge: 2016-05-28 | Disposition: A | Discharge status: Home / Self Care | Payer: Medicare Other | Attending: Emergency Medicine | Admitting: Emergency Medicine

## 2016-05-28 ENCOUNTER — Encounter: Payer: Self-pay | Admitting: Emergency Medicine

## 2016-05-28 DIAGNOSIS — R531 Weakness: Secondary | ICD-10-CM

## 2016-05-28 DIAGNOSIS — I4891 Unspecified atrial fibrillation: Secondary | ICD-10-CM | POA: Diagnosis not present

## 2016-05-28 DIAGNOSIS — I110 Hypertensive heart disease with heart failure: Secondary | ICD-10-CM | POA: Insufficient documentation

## 2016-05-28 DIAGNOSIS — Z8673 Personal history of transient ischemic attack (TIA), and cerebral infarction without residual deficits: Secondary | ICD-10-CM | POA: Diagnosis not present

## 2016-05-28 DIAGNOSIS — E785 Hyperlipidemia, unspecified: Secondary | ICD-10-CM | POA: Insufficient documentation

## 2016-05-28 DIAGNOSIS — I5023 Acute on chronic systolic (congestive) heart failure: Secondary | ICD-10-CM | POA: Insufficient documentation

## 2016-05-28 DIAGNOSIS — Z79899 Other long term (current) drug therapy: Secondary | ICD-10-CM | POA: Diagnosis not present

## 2016-05-28 DIAGNOSIS — R799 Abnormal finding of blood chemistry, unspecified: Secondary | ICD-10-CM | POA: Diagnosis present

## 2016-05-28 DIAGNOSIS — Z87891 Personal history of nicotine dependence: Secondary | ICD-10-CM | POA: Diagnosis not present

## 2016-05-28 LAB — COMPREHENSIVE METABOLIC PANEL
ALK PHOS: 90 U/L (ref 38–126)
ALT: 16 U/L — AB (ref 17–63)
AST: 24 U/L (ref 15–41)
Albumin: 3.7 g/dL (ref 3.5–5.0)
Anion gap: 8 (ref 5–15)
BILIRUBIN TOTAL: 0.2 mg/dL — AB (ref 0.3–1.2)
BUN: 31 mg/dL — ABNORMAL HIGH (ref 6–20)
CO2: 30 mmol/L (ref 22–32)
CREATININE: 1.36 mg/dL — AB (ref 0.61–1.24)
Calcium: 9.8 mg/dL (ref 8.9–10.3)
Chloride: 100 mmol/L — ABNORMAL LOW (ref 101–111)
GFR calc Af Amer: 55 mL/min — ABNORMAL LOW (ref >60)
GFR, EST NON AFRICAN AMERICAN: 48 mL/min — AB (ref >60)
Glucose, Bld: 89 mg/dL (ref 65–99)
Potassium: 4.5 mmol/L (ref 3.5–5.1)
Sodium: 138 mmol/L (ref 135–145)
TOTAL PROTEIN: 8.1 g/dL (ref 6.5–8.1)

## 2016-05-28 LAB — CBC WITH DIFFERENTIAL/PLATELET
BASOS ABS: 0 10*3/uL (ref 0–0.1)
Basophils Relative: 0 %
Eosinophils Absolute: 0.6 10*3/uL (ref 0–0.7)
Eosinophils Relative: 6 %
HEMATOCRIT: 36.5 % — AB (ref 40.0–52.0)
HEMOGLOBIN: 12.2 g/dL — AB (ref 13.0–18.0)
LYMPHS PCT: 26 %
Lymphs Abs: 2.9 10*3/uL (ref 1.0–3.6)
MCH: 30.2 pg (ref 26.0–34.0)
MCHC: 33.4 g/dL (ref 32.0–36.0)
MCV: 90.5 fL (ref 80.0–100.0)
MONO ABS: 1.1 10*3/uL — AB (ref 0.2–1.0)
Monocytes Relative: 10 %
NEUTROS ABS: 6.3 10*3/uL (ref 1.4–6.5)
Neutrophils Relative %: 58 %
Platelets: 260 10*3/uL (ref 150–440)
RBC: 4.04 MIL/uL — AB (ref 4.40–5.90)
RDW: 13.4 % (ref 11.5–14.5)
WBC: 11 10*3/uL — ABNORMAL HIGH (ref 3.8–10.6)

## 2016-05-28 LAB — TYPE AND SCREEN
ABO/RH(D): A POS
Antibody Screen: NEGATIVE

## 2016-05-28 LAB — GLUCOSE, CAPILLARY: GLUCOSE-CAPILLARY: 95 mg/dL (ref 65–99)

## 2016-05-28 NOTE — ED Notes (Signed)
Pt reports his blood sugar and hgb has been low.  Pt denies any pain.  Pt is alert. Skin warm  And dry.  Iv in place.  md at bedside with family

## 2016-05-28 NOTE — ED Provider Notes (Signed)
[facility] Emergency Department Provider Note        Time seen: ----------------------------------------- 4:36 PM on 05/28/2016 -----------------------------------------    I have reviewed the triage vital signs and the nursing notes.   HISTORY  Chief Complaint Abnormal Lab    HPI Patrick Harding is a 82 y.o. male who presents to ER for low blood sugar and possible low hemoglobin. He denies any pain, family reports she's had poor appetite. Recently he's had outpatient lab work that showed anemia and low blood sugar. Patient denies any complaints at this time. He has outpatient endoscopy and colonoscopy scheduled for possible occult GI bleeding. He currently takes Eliquis.   Past Medical History  Diagnosis Date  . Hyperlipidemia   . Anemia   . Vitamin D deficiency   . Hypertension   . Renal insufficiency   . Erectile dysfunction   . Arrhythmia   . Atrial fibrillation [facility])     Patient Active Problem List   Diagnosis Date Noted  . HLD (hyperlipidemia) 03/28/2016  . AKI (acute kidney injury) ([place]) 03/28/2016  . Dehydration 03/28/2016  . Pressure ulcer 03/21/2016  . CAP (community acquired pneumonia) 03/20/2016  . Protein-calorie malnutrition, severe 03/20/2016  . Speech and language deficits 03/12/2016  . Dysarthria due to recent stroke 03/12/2016  . Stroke determined by clinical assessment ([place]) 03/12/2016  . Lumbar foraminal stenosis (Left Moderate-severe L4-5) (Bilateral Moderate L5-S1) 03/12/2016  . Abnormal MRI, lumbar spine (2012) 01/24/2016  . Chronic low back pain (Location of Secondary source of pain) (Bilateral) (R>L) 01/24/2016  . Chronic lower extremity radicular pain (Location of Primary Source of Pain) (Right) (L3/L4 dermatome) 01/24/2016  . Chronic lumbar radicular pain (Location of Primary Source of Pain) (Right) (L3/L4 dermatome) 01/24/2016  . Chronic pain 01/24/2016  . Long term current use of anticoagulant therapy (Eliquis)  01/24/2016  . Long-term use of high-risk medication 01/24/2016  . 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 Lower extremity weakness (Right) 01/24/2016  . Abnormal loss of weight 12/26/2015  . 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 Lung mass 12/26/2015  . Aortic aneurysm ([place]) 12/26/2015  . Acute on chronic systolic heart failure ([place]) 12/26/2015  . Spinal stenosis, lumbar region, with neurogenic claudication 08/10/2015  . Lumbar facet syndrome 08/10/2015  . Sacroiliac joint dysfunction 08/10/2015  . Atrial fibrillation, unspecified 04/14/2015  . Essential hypertension 04/14/2015  . Atrial fibrillation ([place]) 04/14/2015    Past Surgical History  Procedure Laterality Date  . Head surgery Left     Allergies Aspirin and Penicillins  Social History Social History  Substance Use Topics  . Smoking status: Former Smoker -- 0.50 packs/day for 20 years    Types: Cigarettes    Quit date: 10/24/2015  . Smokeless tobacco: None  . Alcohol Use: No     Comment: occasional    Review of Systems Constitutional: Negative for fever. Cardiovascular: Negative for chest pain. Respiratory: Negative for shortness of breath. Gastrointestinal: Negative for abdominal pain, vomiting and diarrhea. Genitourinary: Negative for dysuria. Musculoskeletal: Negative for back pain. Skin: Negative for rash. Neurological: Negative for headaches, Positive for weakness  10-point ROS otherwise negative.  ____________________________________________   PHYSICAL EXAM:  VITAL SIGNS: ED Triage Vitals  Enc Vitals Group     BP 05/28/16 1443 105/49 mmHg     Pulse Rate 05/28/16 1443 75     Resp 05/28/16 1443 16     Temp 05/28/16 1443 97.6 F (36.4 C)     Temp Source 05/28/16 1443  Oral     SpO2 05/28/16 1443 100 %     Weight 05/28/16 1443 131 lb (59.421 kg)     Height 05/28/16 1443 6' (1.829 m)     Head Cir --      Peak Flow --      Pain Score 05/28/16 1445 0     Pain Loc --       Pain Edu? --      Excl. in [place]? --     Constitutional: Alert and oriented. No acute distress Eyes: Conjunctivae are normal. PERRL. Normal extraocular movements. ENT   Head: Normocephalic and atraumatic.   Nose: No congestion/rhinnorhea.   Mouth/Throat: Mucous membranes are moist.   Neck: No stridor. Cardiovascular: Normal rate, regular rhythm. No murmurs, rubs, or gallops. Respiratory: Normal respiratory effort without tachypnea nor retractions. Breath sounds are clear and equal bilaterally. No wheezes/rales/rhonchi. Gastrointestinal: Soft and nontender. Normal bowel sounds Musculoskeletal: Nontender with normal range of motion in all extremities. No lower extremity tenderness nor edema. Neurologic:  Normal speech and language. No gross focal neurologic deficits are appreciated.  Skin:  Skin is warm, dry and intact. Pallor is noted Psychiatric: Mood and affect are normal. Speech and behavior are normal.  ____________________________________________  ED COURSE:  Pertinent labs & imaging results that were available during my care of the patient were reviewed by me and considered in my medical decision making (see chart for details). Patient's no acute distress, we will recheck his lab work and reevaluate. ____________________________________________    LABS (pertinent positives/negatives)  Labs Reviewed  CBC WITH DIFFERENTIAL/PLATELET - Abnormal; Notable for the following:    WBC 11.0 (*)    RBC 4.04 (*)    Hemoglobin 12.2 (*)    HCT 36.5 (*)    Monocytes Absolute 1.1 (*)    All other components within normal limits  COMPREHENSIVE METABOLIC PANEL - Abnormal; Notable for the following:    Chloride 100 (*)    BUN 31 (*)    Creatinine, Ser 1.36 (*)    ALT 16 (*)    Total Bilirubin 0.2 (*)    GFR calc non Af Amer 48 (*)    GFR calc Af Amer 55 (*)    All other components within normal limits  GLUCOSE, CAPILLARY  TYPE AND SCREEN    ____________________________________________  FINAL ASSESSMENT AND PLAN  Weakness  Plan: Patient with labs as dictated above. Patient's labs today are grossly unremarkable and within normal range from his previous blood work. He does not require blood transfusion, his blood sugar is stable. Have advised increase by mouth intake which is likely the cause of recent transient hypoglycemia. He is stable for discharge.   Earleen Newport, MD   Note: This dictation was prepared with Dragon dictation. Any transcriptional errors that result from this process are unintentional   Earleen Newport, MD 05/28/16 [phone]

## 2016-05-28 NOTE — Discharge Instructions (Signed)

## 2016-05-28 NOTE — ED Notes (Addendum)
Blood sugar low and low hemoglobin.  Called by Dr. Melene Plan office called to give lab results from Friday.  Patient's PCP has been working to schedule an endoscopy to determine where bleeding is coming from, but has not yet been scheduled.  Patient states stools are dark but not black.

## 2016-06-07 ENCOUNTER — Other Ambulatory Visit: Payer: Self-pay

## 2016-06-07 ENCOUNTER — Ambulatory Visit (INDEPENDENT_AMBULATORY_CARE_PROVIDER_SITE_OTHER): Payer: Medicare Other | Admitting: Gastroenterology

## 2016-06-07 ENCOUNTER — Encounter: Payer: Self-pay | Admitting: Gastroenterology

## 2016-06-07 VITALS — BP 117/61 | HR 81 | Temp 97.3°F | Ht 72.0 in

## 2016-06-07 DIAGNOSIS — R634 Abnormal weight loss: Secondary | ICD-10-CM | POA: Diagnosis not present

## 2016-06-07 DIAGNOSIS — K921 Melena: Secondary | ICD-10-CM | POA: Diagnosis not present

## 2016-06-07 MED ORDER — PEG 3350-KCL-NABCB-NACL-NASULF 236 G PO SOLR: 4000.0000 mL | Freq: Once | ORAL | Status: DC

## 2016-06-07 NOTE — Progress Notes (Signed)
Gastroenterology Consultation  Referring Provider:     Casilda Carls, MD Primary Care Physician:  Casilda Carls, MD Primary Gastroenterologist:  Dr. Allen Norris     Reason for Consultation:     Black stools        HPI:   Patrick Harding is a 82 y.o. y/o male referred for consultation & management of Black stools by Dr. Casilda Carls, MD.  This patient comes in today with a report of black stools.  The patient states that his stools are also soft.  During the time he has noticed his black stools he also has lost approximately 31 pounds.  The patient states that he had some dental work done and this may contribute to his weight loss.  There is no report of any abdominal pain, nausea, fevers, bloody stools or change in bowel habits. The patient was seen by Dr. Candace Cruise in the past and was recommended to have an EGD and colonoscopy due to a family history of colon cancer but the patient was on a blood thinner the time and he deferred that.  The patient is now continuing to have symptoms and comes to see me.  Past Medical History  Diagnosis Date  . Hyperlipidemia   . Anemia   . Vitamin D deficiency   . Hypertension   . Renal insufficiency   . Erectile dysfunction   . Arrhythmia   . Atrial fibrillation [facility])     Past Surgical History  Procedure Laterality Date  . Head surgery Left     Prior to Admission medications   Medication Sig Start Date End Date Taking? Authorizing Provider  acetaminophen (TYLENOL) 500 MG tablet Take 1,000 mg by mouth every 6 (six) hours as needed for mild pain or headache.   Yes Historical Provider, MD  albuterol (PROVENTIL HFA;VENTOLIN HFA) 108 (90 Base) MCG/ACT inhaler Inhale 2 puffs into the lungs every 6 (six) hours as needed for wheezing or shortness of breath. 12/07/15  Yes Daymon Larsen, MD  apixaban (ELIQUIS) 5 MG TABS tablet Take 1 tablet (5 mg total) by mouth 2 (two) times daily. 09/02/15  Yes Wellington Hampshire, MD  diltiazem (CARDIZEM CD) 180 MG 24 hr capsule Take 180  mg by mouth daily.   Yes Historical Provider, MD  feeding supplement, ENSURE ENLIVE, (ENSURE ENLIVE) LIQD Take 237 mLs by mouth 3 (three) times daily between meals. 03/28/16  Yes Srikar Sudini, MD  metoprolol (LOPRESSOR) 50 MG tablet Take 1 tablet (50 mg total) by mouth 2 (two) times daily. 12/06/15  Yes Wellington Hampshire, MD  Multiple Vitamin (MULTIVITAMIN WITH MINERALS) TABS tablet Take 1 tablet by mouth daily.   Yes Historical Provider, MD  torsemide (DEMADEX) 20 MG tablet Take 1 tablet (20 mg total) by mouth every other day. 03/28/16  Yes Hillary Bow, MD  clotrimazole-betamethasone (LOTRISONE) cream Reported on 06/07/2016 04/02/16   Historical Provider, MD  diphenhydrAMINE (BENADRYL) 25 mg capsule Take 1 capsule (25 mg total) by mouth every 8 (eight) hours as needed for itching. Patient not taking: Reported on 06/07/2016 03/22/16   Nicholes Mango, MD  docusate sodium (COLACE) 100 MG capsule Take 1 capsule (100 mg total) by mouth 2 (two) times daily. Patient not taking: Reported on 06/07/2016 03/22/16   Nicholes Mango, MD  ferrous sulfate 325 (65 FE) MG tablet Take 325 mg by mouth daily. Reported on 06/07/2016 05/28/16   Historical Provider, MD  levofloxacin (LEVAQUIN) 750 MG tablet Take 1 tablet (750 mg total) by mouth daily.  Patient not taking: Reported on 06/07/2016 03/22/16   Nicholes Mango, MD  omega-3 acid ethyl esters (LOVAZA) 1 g capsule Take 1 g by mouth daily. Reported on 06/07/2016    Historical Provider, MD  pantoprazole (PROTONIX) 40 MG tablet Reported on 06/07/2016 05/25/16   Historical Provider, MD  polyethylene glycol (GOLYTELY) 236 g solution Take 4,000 mLs by mouth once. Drink one 8 oz glass every 20 mins until stools are clear. 06/07/16   [name], MD    Family History  Problem Relation Age of Onset  . Arthritis Father      Social History  Substance Use Topics  . Smoking status: Former Smoker -- 0.50 packs/day for 20 years    Types: Cigarettes    Quit date: 10/24/2015  . Smokeless  tobacco: None  . Alcohol Use: No     Comment: occasional    Allergies as of 06/07/2016 - Review Complete 05/28/2016  Allergen Reaction Noted  . Aspirin Itching 12/30/2015  . Penicillins Hives, Itching, and Other (See Comments) 04/13/2015    Review of Systems:    All systems reviewed and negative except where noted in HPI.   Physical Exam:  BP 117/61 mmHg  Pulse 81  Temp(Src) 97.3 F (36.3 C) (Oral)  Ht 6' (1.829 m)  Wt  No LMP for male patient. Psych:  Alert and cooperative. Normal mood and affect. General:   Alert,  Well-developed, Poorlyl-nourished, pleasant and cooperative in NAD Head:  Normocephalic and atraumatic. Eyes:  Sclera clear, no icterus.   Conjunctiva pink. Ears:  Normal auditory acuity. Nose:  No deformity, discharge, or lesions. Mouth:  No deformity or lesions,oropharynx pink & moist. Neck:  Supple; no masses or thyromegaly. Lungs:  Respirations even and unlabored.  Clear throughout to auscultation.   No wheezes, crackles, or rhonchi. No acute distress. Heart:  Regular rate and rhythm; no murmurs, clicks, rubs, or gallops. Abdomen:  Normal bowel sounds.  No bruits.  Soft, non-tender and non-distended without masses, hepatosplenomegaly or hernias noted.  No guarding or rebound tenderness.  Negative Carnett sign.   Rectal:  Deferred.  Msk:  Symmetrical without gross deformities.  Good, equal movement & strength bilaterally. Pulses:  Normal pulses noted. Extremities:  No clubbing or edema.  No cyanosis. Neurologic:  Alert and oriented x3;  grossly normal neurologically. Skin:  Intact without significant lesions or rashes.  No jaundice. Lymph Nodes:  No significant cervical adenopathy. Psych:  Alert and cooperative. Normal mood and affect.  Imaging Studies: No results found.  Assessment and Plan:   Patrick Harding is a 82 y.o. y/o male who comes today with a history of 2 months of black stools.  The patient will be set up for an EGD and colonoscopy due to his  black stools and his family history of a brother with colon cancer. I have discussed risks & benefits which include, but are not limited to, bleeding, infection, perforation & drug reaction.  The patient agrees with this plan & written consent will be obtained.     Note: This dictation was prepared with Dragon dictation along with smaller phrase technology. Any transcriptional errors that result from this process are unintentional.

## 2016-06-08 ENCOUNTER — Other Ambulatory Visit: Payer: Self-pay

## 2016-06-29 ENCOUNTER — Telehealth: Payer: Self-pay | Admitting: Gastroenterology

## 2016-06-29 NOTE — Telephone Encounter (Signed)
Note made to contact wife when suprep samples arrive.

## 2016-06-29 NOTE — Telephone Encounter (Signed)
Call [facility] regarding a colonoscopy prep kit when they come in for Patrick Harding. Call her at [phone]

## 2016-07-17 ENCOUNTER — Encounter
Admission: RE | Disposition: A | Discharge status: Home / Self Care | Payer: Self-pay | Source: Ambulatory Visit | Attending: Gastroenterology

## 2016-07-17 ENCOUNTER — Ambulatory Visit: Payer: Medicare Other | Admitting: Anesthesiology

## 2016-07-17 ENCOUNTER — Encounter: Payer: Self-pay | Admitting: Anesthesiology

## 2016-07-17 ENCOUNTER — Ambulatory Visit
Admission: RE | Admit: 2016-07-17 | Discharge: 2016-07-17 | Disposition: A | Discharge status: Home / Self Care | Payer: Medicare Other | Source: Ambulatory Visit | Attending: Gastroenterology | Admitting: Gastroenterology

## 2016-07-17 DIAGNOSIS — I4891 Unspecified atrial fibrillation: Secondary | ICD-10-CM | POA: Diagnosis not present

## 2016-07-17 DIAGNOSIS — Z87891 Personal history of nicotine dependence: Secondary | ICD-10-CM | POA: Insufficient documentation

## 2016-07-17 DIAGNOSIS — N529 Male erectile dysfunction, unspecified: Secondary | ICD-10-CM | POA: Diagnosis not present

## 2016-07-17 DIAGNOSIS — K621 Rectal polyp: Secondary | ICD-10-CM | POA: Diagnosis not present

## 2016-07-17 DIAGNOSIS — Z79899 Other long term (current) drug therapy: Secondary | ICD-10-CM | POA: Insufficient documentation

## 2016-07-17 DIAGNOSIS — D128 Benign neoplasm of rectum: Secondary | ICD-10-CM | POA: Diagnosis not present

## 2016-07-17 DIAGNOSIS — E559 Vitamin D deficiency, unspecified: Secondary | ICD-10-CM | POA: Insufficient documentation

## 2016-07-17 DIAGNOSIS — K2970 Gastritis, unspecified, without bleeding: Secondary | ICD-10-CM

## 2016-07-17 DIAGNOSIS — D649 Anemia, unspecified: Secondary | ICD-10-CM | POA: Insufficient documentation

## 2016-07-17 DIAGNOSIS — E785 Hyperlipidemia, unspecified: Secondary | ICD-10-CM | POA: Diagnosis not present

## 2016-07-17 DIAGNOSIS — I739 Peripheral vascular disease, unspecified: Secondary | ICD-10-CM | POA: Diagnosis not present

## 2016-07-17 DIAGNOSIS — K2100 Gastro-esophageal reflux disease with esophagitis, without bleeding: Secondary | ICD-10-CM

## 2016-07-17 DIAGNOSIS — D120 Benign neoplasm of cecum: Secondary | ICD-10-CM | POA: Diagnosis not present

## 2016-07-17 DIAGNOSIS — K921 Melena: Secondary | ICD-10-CM

## 2016-07-17 DIAGNOSIS — I10 Essential (primary) hypertension: Secondary | ICD-10-CM | POA: Diagnosis not present

## 2016-07-17 DIAGNOSIS — Z880 Allergy status to penicillin: Secondary | ICD-10-CM | POA: Insufficient documentation

## 2016-07-17 DIAGNOSIS — K2950 Unspecified chronic gastritis without bleeding: Secondary | ICD-10-CM | POA: Diagnosis not present

## 2016-07-17 DIAGNOSIS — D123 Benign neoplasm of transverse colon: Secondary | ICD-10-CM

## 2016-07-17 HISTORY — PX: COLONOSCOPY WITH PROPOFOL: SHX5780

## 2016-07-17 HISTORY — PX: ESOPHAGOGASTRODUODENOSCOPY (EGD) WITH PROPOFOL: SHX5813

## 2016-07-17 SURGERY — COLONOSCOPY WITH PROPOFOL
Anesthesia: General

## 2016-07-17 MED ORDER — PROPOFOL 500 MG/50ML IV EMUL
INTRAVENOUS | Status: DC | PRN
  Administered 2016-07-17: 150 ug/kg/min via INTRAVENOUS

## 2016-07-17 MED ORDER — PHENYLEPHRINE HCL 10 MG/ML IJ SOLN
INTRAMUSCULAR | Status: DC | PRN
  Administered 2016-07-17 (×2): 100 ug via INTRAVENOUS

## 2016-07-17 MED ORDER — SODIUM CHLORIDE 0.9 % IV SOLN
INTRAVENOUS | Status: DC
Start: 2016-07-17 — End: 2016-07-17
  Administered 2016-07-17 (×2): via INTRAVENOUS

## 2016-07-17 MED ORDER — MIDAZOLAM HCL 5 MG/5ML IJ SOLN
INTRAMUSCULAR | Status: DC | PRN
  Administered 2016-07-17: 1 mg via INTRAVENOUS

## 2016-07-17 MED ORDER — IPRATROPIUM-ALBUTEROL 0.5-2.5 (3) MG/3ML IN SOLN
3.0000 mL | Freq: Once | RESPIRATORY_TRACT | Status: AC
  Administered 2016-07-17: 3 mL via RESPIRATORY_TRACT
  Filled 2016-07-17: qty 3

## 2016-07-17 MED ORDER — FENTANYL CITRATE (PF) 100 MCG/2ML IJ SOLN
INTRAMUSCULAR | Status: DC | PRN
  Administered 2016-07-17: 50 ug via INTRAVENOUS

## 2016-07-17 MED ORDER — PROPOFOL 10 MG/ML IV BOLUS
INTRAVENOUS | Status: DC | PRN
  Administered 2016-07-17: 60 mg via INTRAVENOUS

## 2016-07-17 MED ORDER — LIDOCAINE 2% (20 MG/ML) 5 ML SYRINGE
INTRAMUSCULAR | Status: DC | PRN
  Administered 2016-07-17: 40 mg via INTRAVENOUS

## 2016-07-17 NOTE — Anesthesia Preprocedure Evaluation (Signed)
Anesthesia Evaluation  Patient identified by MRN, date of birth, ID band Patient awake    Reviewed: Allergy & Precautions, H&P , NPO status , Patient's Chart, lab work & pertinent test results  History of Anesthesia Complications Negative for: history of anesthetic complications  Airway Mallampati: III  TM Distance: >3 FB Neck ROM: limited    Dental  (+) Poor Dentition, Chipped, Missing   Pulmonary neg shortness of breath, pneumonia, resolved, former smoker,    Pulmonary exam normal breath sounds clear to auscultation       Cardiovascular Exercise Tolerance: Poor hypertension, (-) angina+ Peripheral Vascular Disease  (-) Past MI + dysrhythmias  Rhythm:irregular Rate:Tachycardia     Neuro/Psych CVA, Residual Symptoms negative neurological ROS  negative psych ROS   GI/Hepatic negative GI ROS, Neg liver ROS,   Endo/Other  negative endocrine ROS  Renal/GU Renal disease  negative genitourinary   Musculoskeletal   Abdominal   Peds  Hematology negative hematology ROS (+)   Anesthesia Other Findings Past Medical History: No date: Anemia No date: Arrhythmia No date: Atrial fibrillation (HCC) No date: Erectile dysfunction No date: Hyperlipidemia No date: Hypertension No date: Renal insufficiency No date: Vitamin D deficiency  Past Surgical History: No date: Head surgery Left  BMI    Body Mass Index:  18.31 kg/m      Reproductive/Obstetrics negative OB ROS                             Anesthesia Physical Anesthesia Plan  ASA: III  Anesthesia Plan: General   Post-op Pain Management:    Induction:   Airway Management Planned:   Additional Equipment:   Intra-op Plan:   Post-operative Plan:   Informed Consent: I have reviewed the patients History and Physical, chart, labs and discussed the procedure including the risks, benefits and alternatives for the proposed anesthesia  with the patient or authorized representative who has indicated his/her understanding and acceptance.   Dental Advisory Given  Plan Discussed with: Anesthesiologist, CRNA and Surgeon  Anesthesia Plan Comments:         Anesthesia Quick Evaluation

## 2016-07-17 NOTE — Op Note (Signed)
[facility] Gastroenterology Patient Name: Patrick Harding Procedure Date: 07/17/2016 8:51 AM MRN: [id number] Account #: [id number] Date of Birth: [birth year] Admit Type: Outpatient Age: 82 Room: [facility] ENDO ROOM 4 Gender: Male Note Status: Finalized Procedure:            Upper GI endoscopy Indications:          Melena Providers:            [name] MD, MD Referring MD:         Casilda Carls, MD (Referring MD) Medicines:            Propofol per Anesthesia Complications:        No immediate complications. Procedure:            Pre-Anesthesia Assessment:                       - Prior to the procedure, a History and Physical was                        performed, and patient medications and allergies were                        reviewed. The patient's tolerance of previous                        anesthesia was also reviewed. The risks and benefits of                        the procedure and the sedation options and risks were                        discussed with the patient. All questions were                        answered, and informed consent was obtained. Prior                        Anticoagulants: The patient has taken no previous                        anticoagulant or antiplatelet agents. ASA Grade                        Assessment: II - A patient with mild systemic disease.                        After reviewing the risks and benefits, the patient was                        deemed in satisfactory condition to undergo the                        procedure.                       After obtaining informed consent, the endoscope was                        passed under direct vision. Throughout the procedure,  the patient's blood pressure, pulse, and oxygen                        saturations were monitored continuously. The Endoscope                        was introduced through the mouth, and advanced to the                        second part of  duodenum. The upper GI endoscopy was                        accomplished without difficulty. The patient tolerated                        the procedure well. Findings:      LA Grade B (one or more mucosal breaks greater than 5 mm, not extending       between the tops of two mucosal folds) esophagitis with bleeding was       found at the gastroesophageal junction.      Diffuse moderate inflammation characterized by erythema and friability       was found in the entire examined stomach. Biopsies were taken with a       cold forceps for histology.      The examined duodenum was normal. Impression:           - LA Grade B reflux esophagitis.                       - Gastritis. Biopsied.                       - Normal examined duodenum. Recommendation:       - Await pathology results.                       - Advance diet as tolerated. Procedure Code(s):    --- Professional ---                       [phone], Esophagogastroduodenoscopy, flexible, transoral;                        with biopsy, single or multiple Diagnosis Code(s):    --- Professional ---                       K92.1, Melena (includes Hematochezia)                       K21.0, Gastro-esophageal reflux disease with esophagitis                       K29.70, Gastritis, unspecified, without bleeding CPT copyright 2016 American Medical Association. All rights reserved. The codes documented in this report are preliminary and upon coder review may  be revised to meet current compliance requirements. [name] MD, MD 07/17/2016 9:05:24 AM This report has been signed electronically. Number of Addenda: 0 Note Initiated On: 07/17/2016 8:51 AM      [facility]

## 2016-07-17 NOTE — Anesthesia Postprocedure Evaluation (Signed)
Anesthesia Post Note  Patient: Patrick Harding  Procedure(s) Performed: Procedure(s) (LRB): COLONOSCOPY WITH PROPOFOL (N/A) ESOPHAGOGASTRODUODENOSCOPY (EGD) WITH PROPOFOL (N/A)  Patient location during evaluation: Endoscopy Anesthesia Type: General Level of consciousness: awake and alert Pain management: pain level controlled Vital Signs Assessment: post-procedure vital signs reviewed and stable Respiratory status: spontaneous breathing, nonlabored ventilation, respiratory function stable and patient connected to nasal cannula oxygen Cardiovascular status: blood pressure returned to baseline and stable Postop Assessment: no signs of nausea or vomiting Anesthetic complications: no    Last Vitals:  Vitals:   07/17/16 0957 07/17/16 1007  BP: 114/69 106/62  Pulse: (!) 106 (!) 118  Resp: 14 20  Temp:      Last Pain:  Vitals:   07/17/16 0820  TempSrc: Tympanic                 [name]

## 2016-07-17 NOTE — Transfer of Care (Signed)
Immediate Anesthesia Transfer of Care Note  Patient: Patrick Harding  Procedure(s) Performed: Procedure(s): COLONOSCOPY WITH PROPOFOL (N/A) ESOPHAGOGASTRODUODENOSCOPY (EGD) WITH PROPOFOL (N/A)  Patient Location: PACU and Endoscopy Unit  Anesthesia Type:General  Level of Consciousness: sedated  Airway & Oxygen Therapy: Patient Spontanous Breathing and Patient connected to nasal cannula oxygen  Post-op Assessment: Report given to RN and Post -op Vital signs reviewed and stable  Post vital signs: Reviewed and stable  Last Vitals:  Vitals:   07/17/16 0820  BP: (!) 115/55  Pulse: (!) 120  Resp: 19  Temp: (!) 35.9 C    Last Pain:  Vitals:   07/17/16 0820  TempSrc: Tympanic         Complications: No apparent anesthesia complications

## 2016-07-17 NOTE — H&P (Signed)
Patrick Lame, MD [address] [place], [place] [address] Phone: [phone] Fax : [phone]  Primary Care Physician:  Casilda Carls, MD Primary Gastroenterologist:  Dr. Allen Norris  Pre-Procedure History & Physical: HPI:  Patrick Harding is a 82 y.o. male is here for an endoscopy and colonoscopy.   Past Medical History:  Diagnosis Date  . Anemia   . Arrhythmia   . Atrial fibrillation ([place])   . Erectile dysfunction   . Hyperlipidemia   . Hypertension   . Renal insufficiency   . Vitamin D deficiency     Past Surgical History:  Procedure Laterality Date  . Head surgery Left     Prior to Admission medications   Medication Sig Start Date End Date Taking? Authorizing Provider  metoprolol (LOPRESSOR) 50 MG tablet Take 1 tablet (50 mg total) by mouth 2 (two) times daily. 12/06/15  Yes Wellington Hampshire, MD  pantoprazole (PROTONIX) 40 MG tablet Reported on 06/07/2016 05/25/16  Yes Historical Provider, MD  torsemide (DEMADEX) 20 MG tablet Take 1 tablet (20 mg total) by mouth every other day. 03/28/16  Yes Srikar Sudini, MD  acetaminophen (TYLENOL) 500 MG tablet Take 1,000 mg by mouth every 6 (six) hours as needed for mild pain or headache.    Historical Provider, MD  albuterol (PROVENTIL HFA;VENTOLIN HFA) 108 (90 Base) MCG/ACT inhaler Inhale 2 puffs into the lungs every 6 (six) hours as needed for wheezing or shortness of breath. 12/07/15   Daymon Larsen, MD  apixaban (ELIQUIS) 5 MG TABS tablet Take 1 tablet (5 mg total) by mouth 2 (two) times daily. 09/02/15   Wellington Hampshire, MD  clotrimazole-betamethasone (LOTRISONE) cream Reported on 06/07/2016 04/02/16   Historical Provider, MD  diltiazem (CARDIZEM CD) 180 MG 24 hr capsule Take 180 mg by mouth daily.    Historical Provider, MD  diphenhydrAMINE (BENADRYL) 25 mg capsule Take 1 capsule (25 mg total) by mouth every 8 (eight) hours as needed for itching. Patient not taking: Reported on 06/07/2016 03/22/16   Nicholes Mango, MD  docusate  sodium (COLACE) 100 MG capsule Take 1 capsule (100 mg total) by mouth 2 (two) times daily. Patient not taking: Reported on 06/07/2016 03/22/16   Nicholes Mango, MD  feeding supplement, ENSURE ENLIVE, (ENSURE ENLIVE) LIQD Take 237 mLs by mouth 3 (three) times daily between meals. 03/28/16   Srikar Sudini, MD  ferrous sulfate 325 (65 FE) MG tablet Take 325 mg by mouth daily. Reported on 06/07/2016 05/28/16   Historical Provider, MD  levofloxacin (LEVAQUIN) 750 MG tablet Take 1 tablet (750 mg total) by mouth daily. Patient not taking: Reported on 06/07/2016 03/22/16   Nicholes Mango, MD  Multiple Vitamin (MULTIVITAMIN WITH MINERALS) TABS tablet Take 1 tablet by mouth daily.    Historical Provider, MD  omega-3 acid ethyl esters (LOVAZA) 1 g capsule Take 1 g by mouth daily. Reported on 06/07/2016    Historical Provider, MD  polyethylene glycol (GOLYTELY) 236 g solution Take 4,000 mLs by mouth once. Drink one 8 oz glass every 20 mins until stools are clear. 06/07/16   Patrick Lame, MD    Allergies as of 06/08/2016 - Review Complete 05/28/2016  Allergen Reaction Noted  . Aspirin Itching 12/30/2015  . Penicillins Hives, Itching, and Other (See Comments) 04/13/2015    Family History  Problem Relation Age of Onset  . Arthritis Father     Social History   Social History  . Marital status: Married    Spouse name: N/A  .  Number of children: N/A  . Years of education: N/A   Occupational History  . Not on file.   Social History Main Topics  . Smoking status: Former Smoker    Packs/day: 0.50    Years: 20.00    Types: Cigarettes    Quit date: 10/24/2015  . Smokeless tobacco: Not on file  . Alcohol use No     Comment: occasional  . Drug use: No  . Sexual activity: Not on file   Other Topics Concern  . Not on file   Social History Narrative  . No narrative on file    Review of Systems: See HPI, otherwise negative ROS  Physical Exam: BP (!) 115/55   Pulse (!) 120   Temp (!) 96.7 F (35.9 C)  (Tympanic)   Resp 19   Ht 6' (1.829 m)   Wt 135 lb (61.2 kg)   SpO2 100%   BMI 18.31 kg/m  General:   Alert,  pleasant and cooperative in NAD Head:  Normocephalic and atraumatic. Neck:  Supple; no masses or thyromegaly. Lungs:  Clear throughout to auscultation.    Heart:  Regular rate and rhythm. Abdomen:  Soft, nontender and nondistended. Normal bowel sounds, without guarding, and without rebound.   Neurologic:  Alert and  oriented x4;  grossly normal neurologically.  Impression/Plan: [name] is here for an endoscopy and colonoscopy to be performed for melena  Risks, benefits, limitations, and alternatives regarding  endoscopy and colonoscopy have been reviewed with the patient.  Questions have been answered.  All parties agreeable.   Patrick Lame, MD  07/17/2016, 8:43 AM

## 2016-07-17 NOTE — Op Note (Signed)
[facility] Gastroenterology Patient Name: Patrick Harding Procedure Date: 07/17/2016 8:50 AM MRN: [id number] Account #: [id number] Date of Birth: [birth year] Admit Type: Outpatient Age: 82 Room: [facility] ENDO ROOM 4 Gender: Male Note Status: Finalized Procedure:            Colonoscopy Indications:          Hematochezia Providers:            [name] MD, MD Referring MD:         Casilda Carls, MD (Referring MD) Medicines:            Propofol per Anesthesia Procedure:            Pre-Anesthesia Assessment:                       - Prior to the procedure, a History and Physical was                        performed, and patient medications and allergies were                        reviewed. The patient's tolerance of previous                        anesthesia was also reviewed. The risks and benefits of                        the procedure and the sedation options and risks were                        discussed with the patient. All questions were                        answered, and informed consent was obtained. Prior                        Anticoagulants: The patient has taken anticoagulant                        medication. ASA Grade Assessment: II - A patient with                        mild systemic disease. After reviewing the risks and                        benefits, the patient was deemed in satisfactory                        condition to undergo the procedure.                       After obtaining informed consent, the colonoscope was                        passed under direct vision. Throughout the procedure,                        the patient's blood pressure, pulse, and oxygen                        saturations  were monitored continuously. The                        Colonoscope was introduced through the anus and                        advanced to the the cecum, identified by appendiceal                        orifice and ileocecal valve. The colonoscopy was                         performed without difficulty. The patient tolerated the                        procedure well. The quality of the bowel preparation                        was excellent. Findings:      The perianal and digital rectal examinations were normal.      A 2 mm polyp was found in the cecum. The polyp was sessile. The polyp       was removed with a cold biopsy forceps. Resection and retrieval were       complete.      Two sessile polyps were found in the transverse colon. The polyps were 3       to 6 mm in size. These polyps were removed with a cold snare. Resection       and retrieval were complete.      A 10 mm polyp was found in the rectum. The polyp was sessile. The polyp       was removed with a hot snare. Resection and retrieval were complete. To       prevent bleeding post-intervention, two hemostatic clips were       successfully placed (MR conditional). There was no bleeding at the end       of the procedure. Impression:           - One 2 mm polyp in the cecum, removed with a cold                        biopsy forceps. Resected and retrieved.                       - Two 3 to 6 mm polyps in the transverse colon, removed                        with a cold snare. Resected and retrieved.                       - One 10 mm polyp in the rectum, removed with a hot                        snare. Resected and retrieved. Clips (MR conditional)                        were placed. Recommendation:       - Await pathology results. Procedure Code(s):    --- Professional ---  45385, Colonoscopy, flexible; with removal of tumor(s),                        polyp(s), or other lesion(s) by snare technique                       45380, 59, Colonoscopy, flexible; with biopsy, single                        or multiple Diagnosis Code(s):    --- Professional ---                       K92.1, Melena (includes Hematochezia)                       D12.0, Benign neoplasm of  cecum                       K62.1, Rectal polyp                       D12.3, Benign neoplasm of transverse colon (hepatic                        flexure or splenic flexure) CPT copyright 2016 American Medical Association. All rights reserved. The codes documented in this report are preliminary and upon coder review may  be revised to meet current compliance requirements. [name] MD, MD 07/17/2016 9:29:01 AM This report has been signed electronically. Number of Addenda: 0 Note Initiated On: 07/17/2016 8:50 AM Scope Withdrawal Time: 0 hours 11 minutes 9 seconds  Total Procedure Duration: 0 hours 19 minutes 36 seconds       [facility]

## 2016-07-18 ENCOUNTER — Encounter: Payer: Self-pay | Admitting: Gastroenterology

## 2016-07-18 LAB — SURGICAL PATHOLOGY

## 2016-07-28 ENCOUNTER — Encounter: Payer: Self-pay | Admitting: Gastroenterology

## 2016-08-20 ENCOUNTER — Other Ambulatory Visit: Payer: Self-pay

## 2016-08-20 MED ORDER — BIS SUBCIT-METRONID-TETRACYC 140-125-125 MG PO CAPS: 3.0000 | ORAL_CAPSULE | Freq: Three times a day (TID) | ORAL | 0 refills | Status: DC

## 2016-08-21 ENCOUNTER — Other Ambulatory Visit: Payer: Self-pay

## 2016-09-01 ENCOUNTER — Encounter: Payer: Self-pay | Admitting: Emergency Medicine

## 2016-09-01 ENCOUNTER — Emergency Department
Admission: EM | Admit: 2016-09-01 | Discharge: 2016-09-01 | Disposition: A | Discharge status: Home / Self Care | Payer: Medicare Other | Attending: Emergency Medicine | Admitting: Emergency Medicine

## 2016-09-01 DIAGNOSIS — I5023 Acute on chronic systolic (congestive) heart failure: Secondary | ICD-10-CM | POA: Diagnosis not present

## 2016-09-01 DIAGNOSIS — Z791 Long term (current) use of non-steroidal anti-inflammatories (NSAID): Secondary | ICD-10-CM | POA: Diagnosis not present

## 2016-09-01 DIAGNOSIS — R42 Dizziness and giddiness: Secondary | ICD-10-CM | POA: Diagnosis present

## 2016-09-01 DIAGNOSIS — I110 Hypertensive heart disease with heart failure: Secondary | ICD-10-CM | POA: Insufficient documentation

## 2016-09-01 DIAGNOSIS — Z79899 Other long term (current) drug therapy: Secondary | ICD-10-CM | POA: Insufficient documentation

## 2016-09-01 DIAGNOSIS — T887XXA Unspecified adverse effect of drug or medicament, initial encounter: Secondary | ICD-10-CM | POA: Insufficient documentation

## 2016-09-01 DIAGNOSIS — T50995A Adverse effect of other drugs, medicaments and biological substances, initial encounter: Secondary | ICD-10-CM | POA: Diagnosis not present

## 2016-09-01 DIAGNOSIS — Z87891 Personal history of nicotine dependence: Secondary | ICD-10-CM | POA: Diagnosis not present

## 2016-09-01 DIAGNOSIS — Y69 Unspecified misadventure during surgical and medical care: Secondary | ICD-10-CM | POA: Diagnosis not present

## 2016-09-01 DIAGNOSIS — R197 Diarrhea, unspecified: Secondary | ICD-10-CM | POA: Diagnosis not present

## 2016-09-01 DIAGNOSIS — T50905A Adverse effect of unspecified drugs, medicaments and biological substances, initial encounter: Secondary | ICD-10-CM

## 2016-09-01 LAB — URINALYSIS COMPLETE WITH MICROSCOPIC (ARMC ONLY)
BACTERIA UA: NONE SEEN
BILIRUBIN URINE: NEGATIVE
GLUCOSE, UA: NEGATIVE mg/dL
Hgb urine dipstick: NEGATIVE
Ketones, ur: NEGATIVE mg/dL
Leukocytes, UA: NEGATIVE
Nitrite: NEGATIVE
Protein, ur: NEGATIVE mg/dL
SPECIFIC GRAVITY, URINE: 1.008 (ref 1.005–1.030)
Squamous Epithelial / LPF: NONE SEEN
pH: 6 (ref 5.0–8.0)

## 2016-09-01 LAB — BASIC METABOLIC PANEL
ANION GAP: 9 (ref 5–15)
BUN: 37 mg/dL — AB (ref 6–20)
CALCIUM: 9.8 mg/dL (ref 8.9–10.3)
CO2: 27 mmol/L (ref 22–32)
Chloride: 99 mmol/L — ABNORMAL LOW (ref 101–111)
Creatinine, Ser: 1.6 mg/dL — ABNORMAL HIGH (ref 0.61–1.24)
GFR calc Af Amer: 45 mL/min — ABNORMAL LOW (ref >60)
GFR, EST NON AFRICAN AMERICAN: 39 mL/min — AB (ref >60)
GLUCOSE: 111 mg/dL — AB (ref 65–99)
Potassium: 4.3 mmol/L (ref 3.5–5.1)
Sodium: 135 mmol/L (ref 135–145)

## 2016-09-01 LAB — CBC
HCT: 36.2 % — ABNORMAL LOW (ref 40.0–52.0)
HEMOGLOBIN: 12.6 g/dL — AB (ref 13.0–18.0)
MCH: 30.3 pg (ref 26.0–34.0)
MCHC: 34.8 g/dL (ref 32.0–36.0)
MCV: 87.1 fL (ref 80.0–100.0)
Platelets: 186 10*3/uL (ref 150–440)
RBC: 4.15 MIL/uL — ABNORMAL LOW (ref 4.40–5.90)
RDW: 13.5 % (ref 11.5–14.5)
WBC: 10.8 10*3/uL — ABNORMAL HIGH (ref 3.8–10.6)

## 2016-09-01 LAB — TROPONIN I

## 2016-09-01 NOTE — ED Provider Notes (Signed)
[facility] Emergency Department Provider Note        Time seen: ----------------------------------------- 6:17 PM on 09/01/2016 -----------------------------------------    I have reviewed the triage vital signs and the nursing notes.   HISTORY  Chief Complaint Hematuria; Melena; and Dizziness    HPI Patrick Harding is a 82 y.o. male who presents to ER for black stools, dark urine and dizziness that started 3 days ago. This started around the time he started a medication called Pyleraafter he tested positive for H. pylori. Patient denies any complaints, states had diarrhea but denies seeing blood in his stool or hematuria. He denies fevers, chills, chest pain, shortness of breath, vomiting or other symptoms at this time.   Past Medical History:  Diagnosis Date  . Anemia   . Arrhythmia   . Atrial fibrillation ([place])   . Erectile dysfunction   . Hyperlipidemia   . Hypertension   . Renal insufficiency   . Vitamin D deficiency     Patient Active Problem List   Diagnosis Date Noted  . Blood in stool   . Benign neoplasm of cecum   . Rectal polyp   . Benign neoplasm of transverse colon   . Reflux esophagitis   . Gastritis   . HLD (hyperlipidemia) 03/28/2016  . AKI (acute kidney injury) ([place]) 03/28/2016  . Dehydration 03/28/2016  . Pressure ulcer 03/21/2016  . CAP (community acquired pneumonia) 03/20/2016  . Protein-calorie malnutrition, severe 03/20/2016  . Speech and language deficits 03/12/2016  . Dysarthria due to recent stroke 03/12/2016  . Stroke determined by clinical assessment ([place]) 03/12/2016  . Lumbar foraminal stenosis (Left Moderate-severe L4-5) (Bilateral Moderate L5-S1) 03/12/2016  . Abnormal MRI, lumbar spine (2012) 01/24/2016  . Chronic low back pain (Location of Secondary source of pain) (Bilateral) (R>L) 01/24/2016  . Chronic lower extremity radicular pain (Location of Primary Source of Pain) (Right) (L3/L4 dermatome) 01/24/2016  .  Chronic lumbar radicular pain (Location of Primary Source of Pain) (Right) (L3/L4 dermatome) 01/24/2016  . Chronic pain 01/24/2016  . Long term current use of anticoagulant therapy (Eliquis) 01/24/2016  . Long-term use of high-risk medication 01/24/2016  . 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 Lower extremity weakness (Right) 01/24/2016  . Abnormal loss of weight 12/26/2015  . 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 Lung mass 12/26/2015  . Aortic aneurysm ([place]) 12/26/2015  . Acute on chronic systolic heart failure ([place]) 12/26/2015  . Spinal stenosis, lumbar region, with neurogenic claudication 08/10/2015  . Lumbar facet syndrome 08/10/2015  . Sacroiliac joint dysfunction 08/10/2015  . Atrial fibrillation, unspecified 04/14/2015  . Essential hypertension 04/14/2015  . Atrial fibrillation ([place]) 04/14/2015    Past Surgical History:  Procedure Laterality Date  . COLONOSCOPY WITH PROPOFOL N/A 07/17/2016   Procedure: COLONOSCOPY WITH PROPOFOL;  Surgeon: [name], MD;  Location: [facility] ENDOSCOPY;  Service: Endoscopy;  Laterality: N/A;  . ESOPHAGOGASTRODUODENOSCOPY (EGD) WITH PROPOFOL N/A 07/17/2016   Procedure: ESOPHAGOGASTRODUODENOSCOPY (EGD) WITH PROPOFOL;  Surgeon: [name], MD;  Location: [facility] ENDOSCOPY;  Service: Endoscopy;  Laterality: N/A;  . Head surgery Left     Allergies Aspirin and Penicillins  Social History Social History  Substance Use Topics  . Smoking status: Former Smoker    Packs/day: 0.50    Years: 20.00    Types: Cigarettes    Quit date: 10/24/2015  . Smokeless tobacco: Never Used  . Alcohol use No     Comment: occasional    Review of Systems Constitutional: Negative  for fever. Cardiovascular: Negative for chest pain. Respiratory: Negative for shortness of breath. Gastrointestinal: Negative for abdominal pain, vomiting. Positive for diarrhea Genitourinary: Negative for dysuria. positive for polyuria and possible  hematuria Musculoskeletal: Negative for back pain. Skin: Negative for rash. Neurological: Negative for headaches, focal weakness or numbness. Positive for dizziness  10-point ROS otherwise negative.  ____________________________________________   PHYSICAL EXAM:  VITAL SIGNS: ED Triage Vitals [09/01/16 1612]  Enc Vitals Group     BP (!) 92/41     Pulse Rate 90     Resp 18     Temp 97.8 F (36.6 C)     Temp Source Oral     SpO2 100 %     Weight 126 lb (57.2 kg)     Height 6' (1.829 m)     Head Circumference      Peak Flow      Pain Score      Pain Loc      Pain Edu?      Excl. in [place]?     Constitutional: Alert and oriented. Well appearing and in no distress. Eyes: Conjunctivae are normal. PERRL. Normal extraocular movements. ENT   Head: Normocephalic and atraumatic.   Nose: No congestion/rhinnorhea.   Mouth/Throat: Mucous membranes are moist.   Neck: No stridor. Cardiovascular: Irregularly irregular rhythm. No murmurs, rubs, or gallops. Respiratory: Normal respiratory effort without tachypnea nor retractions. Breath sounds are clear and equal bilaterally. No wheezes/rales/rhonchi. Gastrointestinal: Soft and nontender. Normal bowel sounds Rectal: Heme-negative stool, no hemorrhoids Musculoskeletal: Nontender with normal range of motion in all extremities. No lower extremity tenderness nor edema. Neurologic:  Normal speech and language. No gross focal neurologic deficits are appreciated.  Skin:  Skin is warm, dry and intact. No rash noted. Psychiatric: Mood and affect are normal. Speech and behavior are normal.  ____________________________________________  EKG: Interpreted by me. Atrial fibrillation with a rate of 76 bpm, LVH, septal infarct, normal QT, normal axis.  ____________________________________________  ED COURSE:  Pertinent labs & imaging results that were available during my care of the patient were reviewed by me and considered in my medical  decision making (see chart for details). Clinical Course  Patient is in no distress, we'll assess with basic labs. Rectal exam is heme negative.  Procedures ____________________________________________   LABS (pertinent positives/negatives)  Labs Reviewed  BASIC METABOLIC PANEL - Abnormal; Notable for the following:       Result Value   Chloride 99 (*)    Glucose, Bld 111 (*)    BUN 37 (*)    Creatinine, Ser 1.60 (*)    GFR calc non Af Amer 39 (*)    GFR calc Af Amer 45 (*)    All other components within normal limits  CBC - Abnormal; Notable for the following:    WBC 10.8 (*)    RBC 4.15 (*)    Hemoglobin 12.6 (*)    HCT 36.2 (*)    All other components within normal limits  URINALYSIS COMPLETEWITH MICROSCOPIC ([facility] ONLY) - Abnormal; Notable for the following:    Color, Urine YELLOW (*)    APPearance CLEAR (*)    All other components within normal limits  TROPONIN I  CBG MONITORING, ED  ___________________________________________  FINAL ASSESSMENT AND PLAN  Diarrhea, adverse medication reaction  Plan: Patient with labs as dictated above. Patient is in no distress, again his labs did not reveal hematuria or blood in his stools. He was heme-negative urine examination. Symptoms are likely secondary  to new medication as dictated above. I will advise that he stop this medication and follow-up with his doctor as an outpatient.   Earleen Newport, MD   Note: This dictation was prepared with Dragon dictation. Any transcriptional errors that result from this process are unintentional    Earleen Newport, MD 09/01/16 [phone]

## 2016-09-01 NOTE — ED Triage Notes (Signed)
Pt with black stools, blood in urine and dizziness started three days ago.

## 2016-09-15 ENCOUNTER — Other Ambulatory Visit: Payer: Self-pay | Admitting: Cardiovascular Disease

## 2016-09-20 ENCOUNTER — Telehealth (INDEPENDENT_AMBULATORY_CARE_PROVIDER_SITE_OTHER): Payer: Self-pay | Admitting: Vascular Surgery

## 2016-09-20 NOTE — Telephone Encounter (Signed)
Pt called the office today to verify to see if he had an appt, I advised the pt that he had an appt 09/21/16 w/Dr Lucky Cowboy, pt then stated why was he coming in and which part of his body was we touching. Pt stated he didn't have any money to pay his copay and what did he need to do. I advise pt I would need to speak to my manager and put him on hold and then I advised him that he would need to reschedule his appt b/c the contract with his insurance company requires [place] to collect the copay when services are rendered. I ask pt when did he want to r/s his appt when he had money and he stated his check is gone before he get it, and I could throw the appt away or do what ever I want to with the appt said goodbye and hung up.

## 2016-09-21 ENCOUNTER — Ambulatory Visit (INDEPENDENT_AMBULATORY_CARE_PROVIDER_SITE_OTHER): Payer: Medicare Other | Admitting: Vascular Surgery

## 2016-10-15 ENCOUNTER — Emergency Department: Payer: Medicare Other

## 2016-10-15 ENCOUNTER — Encounter: Payer: Self-pay | Admitting: Emergency Medicine

## 2016-10-15 ENCOUNTER — Emergency Department
Admission: EM | Admit: 2016-10-15 | Discharge: 2016-10-15 | Disposition: A | Discharge status: Home / Self Care | Payer: Medicare Other | Attending: Emergency Medicine | Admitting: Emergency Medicine

## 2016-10-15 DIAGNOSIS — I5023 Acute on chronic systolic (congestive) heart failure: Secondary | ICD-10-CM | POA: Insufficient documentation

## 2016-10-15 DIAGNOSIS — Z79899 Other long term (current) drug therapy: Secondary | ICD-10-CM | POA: Insufficient documentation

## 2016-10-15 DIAGNOSIS — M25512 Pain in left shoulder: Secondary | ICD-10-CM | POA: Insufficient documentation

## 2016-10-15 DIAGNOSIS — Z87891 Personal history of nicotine dependence: Secondary | ICD-10-CM | POA: Diagnosis not present

## 2016-10-15 DIAGNOSIS — I110 Hypertensive heart disease with heart failure: Secondary | ICD-10-CM | POA: Insufficient documentation

## 2016-10-15 DIAGNOSIS — J449 Chronic obstructive pulmonary disease, unspecified: Secondary | ICD-10-CM | POA: Diagnosis not present

## 2016-10-15 DIAGNOSIS — M25511 Pain in right shoulder: Secondary | ICD-10-CM | POA: Insufficient documentation

## 2016-10-15 HISTORY — DX: Chronic obstructive pulmonary disease, unspecified: J44.9

## 2016-10-15 LAB — TROPONIN I: Troponin I: <0.03 ng/mL (ref <0.03)

## 2016-10-15 LAB — CBC WITH DIFFERENTIAL/PLATELET
BASOS PCT: 0 %
Basophils Absolute: 0 10*3/uL (ref 0–0.1)
EOS ABS: 0.5 10*3/uL (ref 0–0.7)
EOS PCT: 4 %
HCT: 31.7 % — ABNORMAL LOW (ref 40.0–52.0)
HEMOGLOBIN: 10.8 g/dL — AB (ref 13.0–18.0)
Lymphocytes Relative: 15 %
Lymphs Abs: 1.8 10*3/uL (ref 1.0–3.6)
MCH: 29.6 pg (ref 26.0–34.0)
MCHC: 34 g/dL (ref 32.0–36.0)
MCV: 87.3 fL (ref 80.0–100.0)
MONOS PCT: 12 %
Monocytes Absolute: 1.5 10*3/uL — ABNORMAL HIGH (ref 0.2–1.0)
NEUTROS PCT: 69 %
Neutro Abs: 8.5 10*3/uL — ABNORMAL HIGH (ref 1.4–6.5)
PLATELETS: 226 10*3/uL (ref 150–440)
RBC: 3.64 MIL/uL — AB (ref 4.40–5.90)
RDW: 13.5 % (ref 11.5–14.5)
WBC: 12.3 10*3/uL — AB (ref 3.8–10.6)

## 2016-10-15 LAB — GLUCOSE, CAPILLARY: GLUCOSE-CAPILLARY: 100 mg/dL — AB (ref 65–99)

## 2016-10-15 LAB — BASIC METABOLIC PANEL
ANION GAP: 7 (ref 5–15)
BUN: 28 mg/dL — ABNORMAL HIGH (ref 6–20)
CALCIUM: 9.1 mg/dL (ref 8.9–10.3)
CO2: 29 mmol/L (ref 22–32)
CREATININE: 1.52 mg/dL — AB (ref 0.61–1.24)
Chloride: 101 mmol/L (ref 101–111)
GFR, EST AFRICAN AMERICAN: 48 mL/min — AB (ref >60)
GFR, EST NON AFRICAN AMERICAN: 42 mL/min — AB (ref >60)
Glucose, Bld: 100 mg/dL — ABNORMAL HIGH (ref 65–99)
Potassium: 4 mmol/L (ref 3.5–5.1)
SODIUM: 137 mmol/L (ref 135–145)

## 2016-10-15 MED ORDER — OXYCODONE-ACETAMINOPHEN 5-325 MG PO TABS
1.0000 | ORAL_TABLET | Freq: Once | ORAL | Status: AC
  Administered 2016-10-15: 1 via ORAL
  Filled 2016-10-15: qty 1

## 2016-10-15 MED ORDER — MELOXICAM 7.5 MG PO TABS: 7.5000 mg | ORAL_TABLET | Freq: Every day | ORAL | 0 refills | Status: AC

## 2016-10-15 MED ORDER — KETOROLAC TROMETHAMINE 30 MG/ML IJ SOLN
INTRAMUSCULAR | Status: AC
  Administered 2016-10-15: 30 mg via INTRAVENOUS
  Filled 2016-10-15: qty 1

## 2016-10-15 MED ORDER — KETOROLAC TROMETHAMINE 60 MG/2ML IM SOLN: 60.0000 mg | Freq: Once | INTRAMUSCULAR | Status: DC

## 2016-10-15 MED ORDER — KETOROLAC TROMETHAMINE 30 MG/ML IJ SOLN
30.0000 mg | Freq: Once | INTRAMUSCULAR | Status: AC
  Administered 2016-10-15: 30 mg via INTRAVENOUS

## 2016-10-15 NOTE — ED Notes (Signed)
MD Dahlia Client at bedside.

## 2016-10-15 NOTE — ED Triage Notes (Signed)
Reports symptoms for 2 days.  States started as neck pain that radiated across left shoulder, now down right arm.

## 2016-10-15 NOTE — ED Notes (Signed)
Pt c/o right arm and right leg pain. Pt reports the pain began in his left arm and now has moved to right arm. Pt normally ambulates with a walker at home. Pt's wife reports that patient has been too weak to walk with walker over the last few days.   Pt has not been eating the last few days. Pt c/o nausea, denies vomiting. Pt's denies having blood in stool currently, however reports having blood in stool approx 2 months ago.   Pt c/o SOB, and weakness.

## 2016-10-15 NOTE — ED Provider Notes (Signed)
[facility] Emergency Department Provider Note   ____________________________________________   First MD Initiated Contact with Patient 10/15/16 0518     (approximate)  I have reviewed the triage vital signs and the nursing notes.   HISTORY  Chief Complaint Weakness    HPI Patrick Harding is a 82 y.o. male comes into the hospital today with arm pain. He reports that his pain in both of his arms. He states this started yesterday but his significant other said it started 2 days ago. He reports he can't put pressure on them and they are just hurting. He states specifically it in his right shoulder. The patient states his pain is 1000 out of 10. Saturday it seemed to be in his left shoulder blade but it went across to the right side down his neck. The patient reports that he thought he may be catching a cold so he took some Tylenol and Mucinex. He reports that the pain seems to be getting worse. He's never had any payment this before so he decided to come into the hospital to get checked out. He did have some nausea and congestion but denies any shortness of breath, chest pain, weakness. The patient according to his wife had been using a walker but has been in his chair over the past few days. He's also had a 30 pound weight loss in the past year. He is here for evaluation.   Past Medical History:  Diagnosis Date  . Anemia   . Arrhythmia   . Atrial fibrillation ([place])   . COPD (chronic obstructive pulmonary disease) ([place])   . Erectile dysfunction   . Hyperlipidemia   . Hypertension   . Renal insufficiency   . Vitamin D deficiency     Patient Active Problem List   Diagnosis Date Noted  . Blood in stool   . Benign neoplasm of cecum   . Rectal polyp   . Benign neoplasm of transverse colon   . Reflux esophagitis   . Gastritis   . HLD (hyperlipidemia) 03/28/2016  . AKI (acute kidney injury) ([place]) 03/28/2016  . Dehydration 03/28/2016  . Pressure ulcer  03/21/2016  . CAP (community acquired pneumonia) 03/20/2016  . Protein-calorie malnutrition, severe 03/20/2016  . Speech and language deficits 03/12/2016  . Dysarthria due to recent stroke 03/12/2016  . Stroke determined by clinical assessment ([place]) 03/12/2016  . Lumbar foraminal stenosis (Left Moderate-severe L4-5) (Bilateral Moderate L5-S1) 03/12/2016  . Abnormal MRI, lumbar spine (2012) 01/24/2016  . Chronic low back pain (Location of Secondary source of pain) (Bilateral) (R>L) 01/24/2016  . Chronic lower extremity radicular pain (Location of Primary Source of Pain) (Right) (L3/L4 dermatome) 01/24/2016  . Chronic lumbar radicular pain (Location of Primary Source of Pain) (Right) (L3/L4 dermatome) 01/24/2016  . Chronic pain 01/24/2016  . Long term current use of anticoagulant therapy (Eliquis) 01/24/2016  . Long-term use of high-risk medication 01/24/2016  . 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 Lower extremity weakness (Right) 01/24/2016  . Abnormal loss of weight 12/26/2015  . 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 Lung mass 12/26/2015  . Aortic aneurysm ([place]) 12/26/2015  . Acute on chronic systolic heart failure ([place]) 12/26/2015  . Spinal stenosis, lumbar region, with neurogenic claudication 08/10/2015  . Lumbar facet syndrome 08/10/2015  . Sacroiliac joint dysfunction 08/10/2015  . Atrial fibrillation, unspecified 04/14/2015  . Essential hypertension 04/14/2015  . Atrial fibrillation ([place]) 04/14/2015    Past Surgical History:  Procedure Laterality Date  .  COLONOSCOPY WITH PROPOFOL N/A 07/17/2016   Procedure: COLONOSCOPY WITH PROPOFOL;  Surgeon: [name], MD;  Location: [facility] ENDOSCOPY;  Service: Endoscopy;  Laterality: N/A;  . ESOPHAGOGASTRODUODENOSCOPY (EGD) WITH PROPOFOL N/A 07/17/2016   Procedure: ESOPHAGOGASTRODUODENOSCOPY (EGD) WITH PROPOFOL;  Surgeon: [name], MD;  Location: [facility] ENDOSCOPY;  Service: Endoscopy;  Laterality: N/A;  . Head  surgery Left     Prior to Admission medications   Medication Sig Start Date End Date Taking? Authorizing Provider  acetaminophen (TYLENOL) 500 MG tablet Take 1,000 mg by mouth every 6 (six) hours as needed for mild pain or headache.   Yes Historical Provider, MD  albuterol (PROVENTIL HFA;VENTOLIN HFA) 108 (90 Base) MCG/ACT inhaler Inhale 2 puffs into the lungs every 6 (six) hours as needed for wheezing or shortness of breath. 12/07/15  Yes Daymon Larsen, MD  apixaban (ELIQUIS) 5 MG TABS tablet Take 1 tablet (5 mg total) by mouth 2 (two) times daily. 09/02/15  Yes Wellington Hampshire, MD  diltiazem (CARDIZEM CD) 180 MG 24 hr capsule Take 180 mg by mouth daily.   Yes Historical Provider, MD  docusate sodium (COLACE) 100 MG capsule Take 1 capsule (100 mg total) by mouth 2 (two) times daily. 03/22/16  Yes Nicholes Mango, MD  metoprolol (LOPRESSOR) 50 MG tablet Take 1 tablet (50 mg total) by mouth 2 (two) times daily. 12/06/15  Yes Wellington Hampshire, MD  torsemide (DEMADEX) 20 MG tablet Take 1 tablet (20 mg total) by mouth every other day. Patient taking differently: Take 20 mg by mouth daily.  03/28/16  Yes Hillary Bow, MD  clotrimazole-betamethasone (LOTRISONE) cream Reported on 06/07/2016 04/02/16   Historical Provider, MD  ELIQUIS 5 MG TABS tablet take 1 tablet by mouth twice a day 09/17/16   Wellington Hampshire, MD  ferrous sulfate 325 (65 FE) MG tablet Take 325 mg by mouth daily. Reported on 06/07/2016 05/28/16   Historical Provider, MD  levofloxacin (LEVAQUIN) 750 MG tablet Take 1 tablet (750 mg total) by mouth daily. Patient not taking: Reported on 06/07/2016 03/22/16   Nicholes Mango, MD  meloxicam (MOBIC) 7.5 MG tablet Take 1 tablet (7.5 mg total) by mouth daily. 10/15/16 10/15/17  Loney Hering, MD  Multiple Vitamin (MULTIVITAMIN WITH MINERALS) TABS tablet Take 1 tablet by mouth daily.    Historical Provider, MD  omega-3 acid ethyl esters (LOVAZA) 1 g capsule Take 1 g by mouth daily. Reported on 06/07/2016     Historical Provider, MD  pantoprazole (PROTONIX) 40 MG tablet Reported on 06/07/2016 05/25/16   Historical Provider, MD    Allergies Aspirin and Penicillins  Family History  Problem Relation Age of Onset  . Arthritis Father     Social History Social History  Substance Use Topics  . Smoking status: Former Smoker    Packs/day: 0.50    Years: 20.00    Types: Cigarettes    Quit date: 10/24/2015  . Smokeless tobacco: Never Used  . Alcohol use No     Comment: occasional    Review of Systems Constitutional: No fever/chills Eyes: No visual changes. ENT: No sore throat. Cardiovascular: Denies chest pain. Respiratory: Denies shortness of breath. Gastrointestinal: No abdominal pain.  No nausea, no vomiting.  No diarrhea.  No constipation. Genitourinary: Negative for dysuria. Musculoskeletal: Arm pain Skin: Negative for rash. Neurological: Generalized weakness  10-point ROS otherwise negative.  ____________________________________________   PHYSICAL EXAM:  VITAL SIGNS: ED Triage Vitals  Enc Vitals Group     BP 10/15/16 0450 Marland Kitchen)  123/57     Pulse Rate 10/15/16 0450 73     Resp 10/15/16 0450 20     Temp 10/15/16 0450 97.6 F (36.4 C)     Temp src --      SpO2 10/15/16 0450 100 %     Weight 10/15/16 0449 135 lb (61.2 kg)     Height 10/15/16 0449 6' (1.829 m)     Head Circumference --      Peak Flow --      Pain Score 10/15/16 0459 10     Pain Loc --      Pain Edu? --      Excl. in [place]? --     Constitutional: Alert and oriented. Well appearing and in no acute distress. Eyes: Conjunctivae are normal. PERRL. EOMI. Head: Atraumatic. Nose: No congestion/rhinnorhea. Mouth/Throat: Mucous membranes are moist.  Oropharynx non-erythematous. Neck: No cervical spine tenderness to palpation. Cardiovascular: Normal rate, regular rhythm. Grossly normal heart sounds.  Good peripheral circulation. Respiratory: Normal respiratory effort.  No retractions. Lungs  CTAB. Gastrointestinal: Soft and nontender. No distention. Positive bowel sounds Musculoskeletal: Tenderness to palpation of right shoulder especially behind the shoulder blade. No pain with range of motion no pain to left arm or shoulder. Neurologic:  Normal speech and language.  Skin:  Skin is warm, dry and intact.  Psychiatric: Mood and affect are normal.   ____________________________________________   LABS (all labs ordered are listed, but only abnormal results are displayed)  Labs Reviewed  BASIC METABOLIC PANEL - Abnormal; Notable for the following:       Result Value   Glucose, Bld 100 (*)    BUN 28 (*)    Creatinine, Ser 1.52 (*)    GFR calc non Af Amer 42 (*)    GFR calc Af Amer 48 (*)    All other components within normal limits  CBC WITH DIFFERENTIAL/PLATELET - Abnormal; Notable for the following:    WBC 12.3 (*)    RBC 3.64 (*)    Hemoglobin 10.8 (*)    HCT 31.7 (*)    Neutro Abs 8.5 (*)    Monocytes Absolute 1.5 (*)    All other components within normal limits  GLUCOSE, CAPILLARY - Abnormal; Notable for the following:    Glucose-Capillary 100 (*)    All other components within normal limits  TROPONIN I  URINALYSIS COMPLETEWITH MICROSCOPIC ([facility] ONLY)  CBG MONITORING, ED   ____________________________________________  EKG  ED ECG REPORT I, Loney Hering, the attending physician, personally viewed and interpreted this ECG.   Date: 10/15/2016  EKG Time: 457  Rate: 69  Rhythm: atrial fibrillation, rate 69  Axis: normal  Intervals:none  ST&T Change: none  ____________________________________________  RADIOLOGY  Shoulder xray Cervical spine xray ____________________________________________   PROCEDURES  Procedure(s) performed: None  Procedures  Critical Care performed: No  ____________________________________________   INITIAL IMPRESSION / ASSESSMENT AND PLAN / ED COURSE  Pertinent labs & imaging results that were available  during my care of the patient were reviewed by me and considered in my medical decision making (see chart for details).  This is an 82 year old male who comes into the hospital today with some arm pain. He reports that his arms up and hurting and it's been difficult to move using his arms. The patient initially told the nurse that he had been having some weakness as well as some shortness of breath and other symptoms but he denies E STEMI. He then told the nurse that  has not been recently. We will check some blood work including a right shoulder x-ray and a cervical spine x-ray. I did give the patient a dose of Toradol and I will give him some Percocet. I will then reassess the patient.  Clinical Course as of Oct 15 740  [facility] Oct 15, 2016  4327 1. Poor visualization of the cervical spine below the level of the C6-7 disc. 2. Within this limitation, no acute fracture or listhesis of the cervical spine. 3. Mild degenerative disc disease and upper cervical facet hypertrophy.   DG Cervical Spine 2-3 Views [AW]  [phone] Degenerative changes in the right shoulder. No acute bony abnormalities.   DG Shoulder Right [AW]    Clinical Course User Index [AW] Loney Hering, MD   The patient's blood work is unremarkable. After the Percocet he reports his pain is much improved. We were able to get the patient up and have him using his walker. He said that the pain was improved enough that he could do so. I feel that the patient does have some arthritis to his shoulders which is causing the pain. The patient be discharged home to follow-up with his primary care physician as well as orthopedic surgery.  ____________________________________________   FINAL CLINICAL IMPRESSION(S) / ED DIAGNOSES  Final diagnoses:  Acute pain of both shoulders      NEW MEDICATIONS STARTED DURING THIS VISIT:  New Prescriptions   MELOXICAM (MOBIC) 7.5 MG TABLET    Take 1 tablet (7.5 mg total) by mouth daily.      Note:  This document was prepared using Dragon voice recognition software and may include unintentional dictation errors.    Loney Hering, MD 10/15/16 [phone]

## 2016-10-15 NOTE — ED Notes (Signed)
Pt able to ambulate with walker around room. Reports decreased pain since arrival. MD made aware.

## 2017-01-30 ENCOUNTER — Encounter: Payer: Self-pay | Admitting: Urology

## 2017-01-30 ENCOUNTER — Ambulatory Visit (INDEPENDENT_AMBULATORY_CARE_PROVIDER_SITE_OTHER): Payer: Medicare Other | Admitting: Urology

## 2017-01-30 VITALS — BP 98/42 | HR 54 | Ht 72.0 in | Wt 127.3 lb

## 2017-01-30 DIAGNOSIS — N3941 Urge incontinence: Secondary | ICD-10-CM | POA: Diagnosis not present

## 2017-01-30 DIAGNOSIS — R351 Nocturia: Secondary | ICD-10-CM

## 2017-01-30 LAB — BLADDER SCAN AMB NON-IMAGING: Scan Result: 16

## 2017-01-30 NOTE — Progress Notes (Signed)
01/30/2017 2:57 PM   Patrick Harding [birth year] [id number]  Referring provider: Casilda Carls [address] [place], [place] [address]  Chief Complaint  Patient presents with  . Urinary Incontinence    HPI: Patient is an 83 year old African American male who is referred for incotinence by Dr. Rosario Jacks with his wife, [name].    His major complaints today are frequency x 5-6, urgency, nocturia x 3, incontinence with urgency x 3 and hesitancy.  He has had these symptoms for one month.  He is wearing three depends daily.  He denies any dysuria, hematuria or suprapubic pain.   He also denies any recent fevers, chills, nausea or vomiting.  His PVR was 16 mL today.    He has had back surgery in the 90's and has received epidurals in his back up until two years ago.  Patient has had old strokes demonstrated on images, but patient denies having any strokes.  This is per wife.  Wife is worried as the patient is in denial about his current medical status.  He stated that he was feeling weak as he did not eat any foods yet today.  We supplied him with crackers and soda and he feels better.  Wife states he does this a lot.    He has noted nothing that made the urge incontinence worse or better.    He drinks two 10 oz waters daily.  He also drinks a lot of sweet tea.     PMH: Past Medical History:  Diagnosis Date  . Anemia   . Arrhythmia   . Atrial fibrillation ([place])   . COPD (chronic obstructive pulmonary disease) ([place])   . Erectile dysfunction   . Hyperlipidemia   . Hypertension   . Renal insufficiency   . Vitamin D deficiency     Surgical History: Past Surgical History:  Procedure Laterality Date  . COLONOSCOPY WITH PROPOFOL N/A 07/17/2016   Procedure: COLONOSCOPY WITH PROPOFOL;  Surgeon: [name], MD;  Location: [facility] ENDOSCOPY;  Service: Endoscopy;  Laterality: N/A;  . ESOPHAGOGASTRODUODENOSCOPY (EGD) WITH PROPOFOL N/A 07/17/2016   Procedure: ESOPHAGOGASTRODUODENOSCOPY (EGD) WITH  PROPOFOL;  Surgeon: [name], MD;  Location: [facility] ENDOSCOPY;  Service: Endoscopy;  Laterality: N/A;  . Head surgery Left     Home Medications:  Allergies as of 01/30/2017      Reactions   Aspirin Itching   Penicillins Hives, Itching, Other (See Comments)   Has patient had a PCN reaction causing immediate rash, facial/tongue/throat swelling, SOB or lightheadedness with hypotension: no Has patient had a PCN reaction causing severe rash involving mucus membranes or skin necrosis: no Has patient had a PCN reaction that required hospitalization no Has patient had a PCN reaction occurring within the last 10 years: no If all of the above answers are "NO", then may proceed with Cephalosporin use.      Medication List       Accurate as of 01/30/17  2:57 PM. Always use your most recent med list.          acetaminophen 500 MG tablet Commonly known as:  TYLENOL Take 1,000 mg by mouth every 6 (six) hours as needed for mild pain or headache.   albuterol 108 (90 Base) MCG/ACT inhaler Commonly known as:  PROVENTIL HFA;VENTOLIN HFA Inhale 2 puffs into the lungs every 6 (six) hours as needed for wheezing or shortness of breath.   apixaban 5 MG Tabs tablet Commonly known as:  ELIQUIS Take 1 tablet (5 mg total)  by mouth 2 (two) times daily.   clotrimazole-betamethasone cream Commonly known as:  LOTRISONE Reported on 06/07/2016   diltiazem 180 MG 24 hr capsule Commonly known as:  CARDIZEM CD Take 180 mg by mouth daily.   docusate sodium 100 MG capsule Commonly known as:  COLACE Take 1 capsule (100 mg total) by mouth 2 (two) times daily.   ferrous sulfate 325 (65 FE) MG tablet Take 325 mg by mouth daily. Reported on 06/07/2016   levofloxacin 750 MG tablet Commonly known as:  LEVAQUIN Take 1 tablet (750 mg total) by mouth daily.   meloxicam 7.5 MG tablet Commonly known as:  MOBIC Take 1 tablet (7.5 mg total) by mouth daily.   metoprolol 50 MG tablet Commonly known as:   LOPRESSOR Take 1 tablet (50 mg total) by mouth 2 (two) times daily.   multivitamin with minerals Tabs tablet Take 1 tablet by mouth daily.   omega-3 acid ethyl esters 1 g capsule Commonly known as:  LOVAZA Take 1 g by mouth daily. Reported on 06/07/2016   pantoprazole 40 MG tablet Commonly known as:  PROTONIX Reported on 06/07/2016   torsemide 20 MG tablet Commonly known as:  DEMADEX Take 1 tablet (20 mg total) by mouth every other day.       Allergies:  Allergies  Allergen Reactions  . Aspirin Itching  . Penicillins Hives, Itching and Other (See Comments)    Has patient had a PCN reaction causing immediate rash, facial/tongue/throat swelling, SOB or lightheadedness with hypotension: no Has patient had a PCN reaction causing severe rash involving mucus membranes or skin necrosis: no Has patient had a PCN reaction that required hospitalization no Has patient had a PCN reaction occurring within the last 10 years: no If all of the above answers are "NO", then may proceed with Cephalosporin use.     Family History: Family History  Problem Relation Age of Onset  . Arthritis Father   . Bladder Cancer Neg Hx   . Kidney cancer Neg Hx   . Prolactinoma Neg Hx     Social History:  reports that he quit smoking about 15 months ago. His smoking use included Cigarettes. He has a 10.00 pack-year smoking history. He has never used smokeless tobacco. He reports that he does not drink alcohol or use drugs.  ROS: UROLOGY Frequent Urination?: Yes Hard to postpone urination?: Yes Burning/pain with urination?: No Get up at night to urinate?: Yes Leakage of urine?: Yes Urine stream starts and stops?: No Trouble starting stream?: Yes Do you have to strain to urinate?: No Blood in urine?: No Urinary tract infection?: No Sexually transmitted disease?: No Injury to kidneys or bladder?: No Painful intercourse?: No Weak stream?: No Erection problems?: Yes Penile pain?:  No  Gastrointestinal Nausea?: No Vomiting?: No Indigestion/heartburn?: No Diarrhea?: No Constipation?: No  Constitutional Fever: No Night sweats?: No Weight loss?: Yes Fatigue?: No  Skin Skin rash/lesions?: No Itching?: Yes  Eyes Blurred vision?: No Double vision?: No  Ears/Nose/Throat Sore throat?: No Sinus problems?: No  Hematologic/Lymphatic Swollen glands?: No Easy bruising?: No  Cardiovascular Leg swelling?: No Chest pain?: No  Respiratory Cough?: Yes Shortness of breath?: Yes  Endocrine Excessive thirst?: Yes  Musculoskeletal Back pain?: Yes Joint pain?: Yes  Neurological Headaches?: Yes Dizziness?: Yes  Psychologic Depression?: No Anxiety?: No  Physical Exam: BP (!) 98/42   Pulse (!) 54   Ht 6' (1.829 m)   Wt 127 lb 4.8 oz (57.7 kg)   SpO2 98%  BMI 17.26 kg/m   Constitutional: Well nourished. Alert and oriented, No acute distress. HEENT: [place] AT, moist mucus membranes. Trachea midline, no masses. Cardiovascular: No clubbing, cyanosis, or edema. Respiratory: Normal respiratory effort, no increased work of breathing. GI: Abdomen is soft, non tender, non distended, no abdominal masses. Liver and spleen not palpable.  No hernias appreciated.  Stool sample for occult testing is not indicated.   GU: No CVA tenderness.  No bladder fullness or masses.  Patient with circumcised phallus.  Urethral meatus is patent.  No penile discharge. No penile lesions or rashes. Scrotum without lesions, cysts, rashes and/or edema.  Testicles are located scrotally bilaterally. No masses are appreciated in the testicles. Left and right epididymis are normal. Rectal: Patient with  normal sphincter tone. Anus and perineum without scarring or rashes. No rectal masses are appreciated. Prostate is approximately 45 grams, no nodules are appreciated. Seminal vesicles are normal. Skin: No rashes, bruises or suspicious lesions. Lymph: No cervical or inguinal  adenopathy. Neurologic: Grossly intact, no focal deficits, moving all 4 extremities. Psychiatric: Normal mood and affect.  Laboratory Data: Lab Results  Component Value Date   WBC 12.3 (H) 10/15/2016   HGB 10.8 (L) 10/15/2016   HCT 31.7 (L) 10/15/2016   MCV 87.3 10/15/2016   PLT 226 10/15/2016    Lab Results  Component Value Date   CREATININE 1.52 (H) 10/15/2016    Lab Results  Component Value Date   HGBA1C 6.5 (H) 03/19/2016    Lab Results  Component Value Date   TSH 2.745 03/19/2016    Lab Results  Component Value Date   AST 24 05/28/2016   Lab Results  Component Value Date   ALT 16 (L) 05/28/2016     Pertinent Imaging: Results for SEABORN, NAKAMA (MRN [id number]) as of 01/30/2017 14:49  Ref. Range 01/30/2017 14:18  Scan Result Unknown 16    Assessment & Plan:    1. Urge incontinence  - given Myrbetriq 25 mg daily, # 28 samples- I have advised the patient of the side effects of Myrbetriq, such as: elevation in BP, urinary retention and/or HA.  - RTC in 3 weeks for I PSS and PVR  2. Nocturia  - will reassess after a trial of Myrbetriq   Return in about 3 weeks (around 02/20/2017) for IPSS and PVR.  These notes generated with voice recognition software. I apologize for typographical errors.  Zara Council, [facility] [address], [place], [place] [address] [phone]

## 2017-02-19 NOTE — Progress Notes (Signed)
02/20/2017 2:07 PM   Patrick Harding 1934/05/03 [id number]  Referring provider: Casilda Carls [address] [place], [place] [address]  Chief Complaint  Patient presents with  . Follow-up    2 week follow up Urge Incontinence    HPI: 83 yo AAM with urge incontinence who presents for follow up after a three week trial of Myrbetriq 25 mg daily.  Background history Patient is an 83 year old African American male who is referred for incotinence by Dr. Rosario Jacks with his wife, [name].  His major complaints today are frequency x 5-6, urgency, nocturia x 3, incontinence with urgency x 3 and hesitancy.  He has had these symptoms for one month.  He is wearing three depends daily.  He denies any dysuria, hematuria or suprapubic pain.   He also denies any recent fevers, chills, nausea or vomiting.  His PVR was 16 mL today.  He has had back surgery in the 90's and has received epidurals in his back up until two years ago.  Patient has had old strokes demonstrated on images, but patient denies having any strokes.  This is per wife.  Wife is worried as the patient is in denial about his current medical status.  He stated that he was feeling weak as he did not eat any foods yet today.  We supplied him with crackers and soda and he feels better.  Wife states he does this a lot.  He has noted nothing that made the urge incontinence worse or better.   He drinks two 10 oz waters daily.  He also drinks a lot of sweet tea.    At his visit 3 weeks ago, he was given Myrbetriq 25 mg daily samples.   He has noticed a reduction in his nocturia x 1.    His IPSS score today is 11, which is moderate lower urinary tract symptomatology. He is mostly dissatisfied with his quality life due to his urinary symptoms.  His PVR is 23 mL.      His major complaints today are urgency and nocturia x 3.   He has had these symptoms for several years.  He denies any dysuria, hematuria or suprapubic pain.   He currently taking Myrbetriq 25  mg daily.  He also denies any recent fevers, chills, nausea or vomiting.      IPSS    Row Name 02/20/17 1300         International Prostate Symptom Score   How often have you had the sensation of not emptying your bladder? Less than 1 in 5     How often have you had to urinate less than every two hours? About half the time     How often have you found you stopped and started again several times when you urinated? Not at All     How often have you found it difficult to postpone urination? More than half the time     How often have you had a weak urinary stream? Not at All     How often have you had to strain to start urination? Not at All     How many times did you typically get up at night to urinate? 3 Times     Total IPSS Score 11       Quality of Life due to urinary symptoms   If you were to spend the rest of your life with your urinary condition just the way it is now how would you feel  about that? Mostly Disatisfied        Score:  1-7 Mild 8-19 Moderate 20-35 Severe   He also is requesting samples of medications for erections.  He is having difficulty with maintaining erections.  He is not taking nitrates.     PMH: Past Medical History:  Diagnosis Date  . Anemia   . Arrhythmia   . Atrial fibrillation ([place])   . COPD (chronic obstructive pulmonary disease) ([place])   . Erectile dysfunction   . Hyperlipidemia   . Hypertension   . Renal insufficiency   . Vitamin D deficiency     Surgical History: Past Surgical History:  Procedure Laterality Date  . COLONOSCOPY WITH PROPOFOL N/A 07/17/2016   Procedure: COLONOSCOPY WITH PROPOFOL;  Surgeon: [name], MD;  Location: [facility] ENDOSCOPY;  Service: Endoscopy;  Laterality: N/A;  . ESOPHAGOGASTRODUODENOSCOPY (EGD) WITH PROPOFOL N/A 07/17/2016   Procedure: ESOPHAGOGASTRODUODENOSCOPY (EGD) WITH PROPOFOL;  Surgeon: [name], MD;  Location: [facility] ENDOSCOPY;  Service: Endoscopy;  Laterality: N/A;  . Head surgery Left     Home  Medications:  Allergies as of 02/20/2017      Reactions   Aspirin Itching   Penicillins Hives, Itching, Other (See Comments)   Has patient had a PCN reaction causing immediate rash, facial/tongue/throat swelling, SOB or lightheadedness with hypotension: no Has patient had a PCN reaction causing severe rash involving mucus membranes or skin necrosis: no Has patient had a PCN reaction that required hospitalization no Has patient had a PCN reaction occurring within the last 10 years: no If all of the above answers are "NO", then may proceed with Cephalosporin use.      Medication List       Accurate as of 02/20/17  2:07 PM. Always use your most recent med list.          acetaminophen 500 MG tablet Commonly known as:  TYLENOL Take 1,000 mg by mouth every 6 (six) hours as needed for mild pain or headache.   albuterol 108 (90 Base) MCG/ACT inhaler Commonly known as:  PROVENTIL HFA;VENTOLIN HFA Inhale 2 puffs into the lungs every 6 (six) hours as needed for wheezing or shortness of breath.   apixaban 5 MG Tabs tablet Commonly known as:  ELIQUIS Take 1 tablet (5 mg total) by mouth 2 (two) times daily.   Avanafil 200 MG Tabs Commonly known as:  STENDRA Take 1 tablet by mouth daily as needed.   clotrimazole-betamethasone cream Commonly known as:  LOTRISONE Reported on 06/07/2016   diltiazem 180 MG 24 hr capsule Commonly known as:  CARDIZEM CD Take 180 mg by mouth daily.   docusate sodium 100 MG capsule Commonly known as:  COLACE Take 1 capsule (100 mg total) by mouth 2 (two) times daily.   ferrous sulfate 325 (65 FE) MG tablet Take 325 mg by mouth daily. Reported on 06/07/2016   levofloxacin 750 MG tablet Commonly known as:  LEVAQUIN Take 1 tablet (750 mg total) by mouth daily.   meloxicam 7.5 MG tablet Commonly known as:  MOBIC Take 1 tablet (7.5 mg total) by mouth daily.   metoprolol 50 MG tablet Commonly known as:  LOPRESSOR Take 1 tablet (50 mg total) by mouth 2  (two) times daily.   multivitamin with minerals Tabs tablet Take 1 tablet by mouth daily.   omega-3 acid ethyl esters 1 g capsule Commonly known as:  LOVAZA Take 1 g by mouth daily. Reported on 06/07/2016   pantoprazole 40 MG tablet Commonly known as:  PROTONIX Reported on 06/07/2016   torsemide 20 MG tablet Commonly known as:  DEMADEX Take 1 tablet (20 mg total) by mouth every other day.       Allergies:  Allergies  Allergen Reactions  . Aspirin Itching  . Penicillins Hives, Itching and Other (See Comments)    Has patient had a PCN reaction causing immediate rash, facial/tongue/throat swelling, SOB or lightheadedness with hypotension: no Has patient had a PCN reaction causing severe rash involving mucus membranes or skin necrosis: no Has patient had a PCN reaction that required hospitalization no Has patient had a PCN reaction occurring within the last 10 years: no If all of the above answers are "NO", then may proceed with Cephalosporin use.     Family History: Family History  Problem Relation Age of Onset  . Arthritis Father   . Bladder Cancer Neg Hx   . Kidney cancer Neg Hx   . Prolactinoma Neg Hx   . Prostate cancer Neg Hx     Social History:  reports that he quit smoking about 15 months ago. His smoking use included Cigarettes. He has a 10.00 pack-year smoking history. He has never used smokeless tobacco. He reports that he does not drink alcohol or use drugs.  ROS: UROLOGY Frequent Urination?: No Hard to postpone urination?: Yes Burning/pain with urination?: No Get up at night to urinate?: Yes Leakage of urine?: No Urine stream starts and stops?: No Trouble starting stream?: No Do you have to strain to urinate?: No Blood in urine?: No Urinary tract infection?: No Sexually transmitted disease?: No Injury to kidneys or bladder?: No Painful intercourse?: No Weak stream?: No Erection problems?: No Penile pain?: No  Gastrointestinal Nausea?:  No Vomiting?: No Indigestion/heartburn?: No Diarrhea?: No Constipation?: No  Constitutional Fever: No Night sweats?: No Weight loss?: Yes Fatigue?: No  Skin Skin rash/lesions?: No Itching?: Yes  Eyes Blurred vision?: No Double vision?: No  Ears/Nose/Throat Sore throat?: No Sinus problems?: No  Hematologic/Lymphatic Swollen glands?: No Easy bruising?: No  Cardiovascular Leg swelling?: No Chest pain?: No  Respiratory Cough?: Yes Shortness of breath?: Yes  Endocrine Excessive thirst?: No  Musculoskeletal Back pain?: Yes Joint pain?: No  Neurological Headaches?: No Dizziness?: No  Psychologic Depression?: No Anxiety?: No  Physical Exam: BP 128/67   Pulse 92   Ht 6' (1.829 m)   Wt 128 lb 6.4 oz (58.2 kg)   BMI 17.41 kg/m   Constitutional: Well nourished. Alert and oriented, No acute distress. HEENT: [place] AT, moist mucus membranes. Trachea midline, no masses. Cardiovascular: No clubbing, cyanosis, or edema. Respiratory: Normal respiratory effort, no increased work of breathing. Skin: No rashes, bruises or suspicious lesions. Lymph: No cervical or inguinal adenopathy. Neurologic: Grossly intact, no focal deficits, moving all 4 extremities. Psychiatric: Normal mood and affect.  Laboratory Data: Lab Results  Component Value Date   WBC 12.3 (H) 10/15/2016   HGB 10.8 (L) 10/15/2016   HCT 31.7 (L) 10/15/2016   MCV 87.3 10/15/2016   PLT 226 10/15/2016    Lab Results  Component Value Date   CREATININE 1.52 (H) 10/15/2016    Lab Results  Component Value Date   HGBA1C 6.5 (H) 03/19/2016    Lab Results  Component Value Date   TSH 2.745 03/19/2016    Lab Results  Component Value Date   AST 24 05/28/2016   Lab Results  Component Value Date   ALT 16 (L) 05/28/2016     Pertinent Imaging: Results for Patrick Harding, Patrick Harding (MRN [id number]) as  of 02/20/2017 13:54  Ref. Range 02/20/2017 13:45  Scan Result Unknown 23     Assessment & Plan:    1.  Urge incontinence  - IPSS score is 11/4, it is improving  - Continue conservative management, avoiding bladder irritants and timed voiding's  - most bothersome symptoms is nocturia  - Patient would like to increase the dose of Myrbetriq to 25 mg to 50 mg daily  - RTC in 3 weeks for I PSS and PVR   2. Nocturia  - nocturia reduced from 4 times nightly to 3 times nightly  - increase Myrbetriq to 50 mg daily  3. Erectile dysfunction  - patient is requesting samples of medications to help with erections  - given sample of Stendra 200 mg; He is warned not to take the medications that contain nitrates.  I also advised him of the side effects, such as: headache, flushing, dyspepsia, abnormal vision, nasal congestion, back pain, myalgia, nausea, dizziness, and rash.  - RTC in 3 weeks for SHIM  Return in about 3 weeks (around 03/13/2017) for SHIM, I PSS, PVR .  These notes generated with voice recognition software. I apologize for typographical errors.  Zara Council, [facility] [address] [place], [place] [address] [phone]

## 2017-02-20 ENCOUNTER — Encounter: Payer: Self-pay | Admitting: Urology

## 2017-02-20 ENCOUNTER — Ambulatory Visit (INDEPENDENT_AMBULATORY_CARE_PROVIDER_SITE_OTHER): Payer: Medicare Other | Admitting: Urology

## 2017-02-20 VITALS — BP 128/67 | HR 92 | Ht 72.0 in | Wt 128.4 lb

## 2017-02-20 DIAGNOSIS — R351 Nocturia: Secondary | ICD-10-CM | POA: Diagnosis not present

## 2017-02-20 DIAGNOSIS — N529 Male erectile dysfunction, unspecified: Secondary | ICD-10-CM | POA: Diagnosis not present

## 2017-02-20 DIAGNOSIS — N3941 Urge incontinence: Secondary | ICD-10-CM

## 2017-02-20 LAB — BLADDER SCAN AMB NON-IMAGING: SCAN RESULT: 23

## 2017-02-20 MED ORDER — AVANAFIL 200 MG PO TABS: 1.0000 | ORAL_TABLET | Freq: Every day | ORAL | 0 refills | Status: DC | PRN

## 2017-03-07 ENCOUNTER — Telehealth: Payer: Self-pay

## 2017-03-07 NOTE — Telephone Encounter (Signed)
Pt called requesting a catheter to be placed for urinary incontinence. Pt denied not being able to urinate, lower abd pain, n/v, f/c. Pt stated "I just want a catheter because im tired of leaking everywhere." Reinforced with pt catheters are not typically given for incontinence. Pt then stated that he saw his PCP earlier in the week who changed a lot of his medications due to fluid loss. Reinforced with pt to keep f/u appt with [facility] and continue taking medications. Pt voiced understanding.

## 2017-03-11 NOTE — Progress Notes (Signed)
03/12/2017 11:26 AM   Patrick Harding 1934-04-02 [id number]  Referring provider: Casilda Harding [address] [place], [place] [address]  Chief Complaint  Patient presents with  . Urinary Incontinence    Urge 3 week follow up     HPI: 83 yo AAM with urge incontinence and ED who presents for follow up after a three week trial of Myrbetriq 50 mg daily for urge incontinence and Stendra 200 mg prn for ED.    Background history Patient is an 83 year old African American male who is referred for incotinence by Dr. Rosario Harding with his wife, Patrick Harding.  His major complaints today are frequency x 5-6, urgency, nocturia x 3, incontinence with urgency x 3 and hesitancy.  He has had these symptoms for one month.  He is wearing three depends daily.  He denies any dysuria, hematuria or suprapubic pain.   He also denies any recent fevers, chills, nausea or vomiting.  His PVR was 16 mL today.  He has had back surgery in the 90's and has received epidurals in his back up until two years ago.  Patient has had old strokes demonstrated on images, but patient denies having any strokes.  This is per wife.  Wife is worried as the patient is in denial about his current medical status.  He stated that he was feeling weak as he did not eat any foods yet today.  We supplied him with crackers and soda and he feels better.  Wife states he does this a lot.  He has noted nothing that made the urge incontinence worse or better.   He drinks two 10 oz waters daily.  He also drinks a lot of sweet tea.    At his visit 3 weeks ago, he was was increased to Myrbetriq 50 mg daily samples.   He has noticed a reduction in his nocturia x 1.    His IPSS score today is 16, which is moderate lower urinary tract symptomatology.  He is mostly dissatisfied with his quality life due to his urinary symptoms.  His PVR is 27 mL.  His previous I PSS score was 11/5.  His previous PVR was 23 mL.    His major complaints today are frequency, urgency and  nocturia x 3.   He has had these symptoms for several years.  He denies any dysuria, hematuria or suprapubic pain.   He currently taking Myrbetriq 50 mg daily.  He states his incontinence was reduced by 50%.  He also denies any recent fevers, chills, nausea or vomiting.  Patient's wife states she is not sure if he is taking his medications correctly as he will not let her help with the medications.  He may be overdosing on the Lasix.        IPSS    Row Name 02/20/17 1300 03/12/17 1100       International Prostate Symptom Score   How often have you had the sensation of not emptying your bladder? Less than 1 in 5 About half the time    How often have you had to urinate less than every two hours? About half the time About half the time    How often have you found you stopped and started again several times when you urinated? Not at All Less than half the time    How often have you found it difficult to postpone urination? More than half the time Almost always    How often have you had a weak  urinary stream? Not at All Not at All    How often have you had to strain to start urination? Not at All Not at All    How many times did you typically get up at night to urinate? 3 Times 3 Times    Total IPSS Score 11 16      Quality of Life due to urinary symptoms   If you were to spend the rest of your life with your urinary condition just the way it is now how would you feel about that? Mostly Disatisfied Mostly Disatisfied       Score:  1-7 Mild 8-19 Moderate 20-35 Severe   Erectile dysfunction His SHIM score is 7, which is seevere.   He has been having difficulty with erections for several.   His major complaint is inability to achieve an erection.  His libido is preserved.   His risk factors for ED are age, BPH, stroke, spinal surgery, DM, HTN, HLD.  He denies any painful erections or curvatures with his erections.   He is still having/no longer having spontaneous erections.   He was given  Stendra 200 mg at his last visit, but he has not tried the samples.       SHIM    Row Name 03/12/17 1107         SHIM: Over the last 6 months:   How do you rate your confidence that you could get and keep an erection? Very Low     When you had erections with sexual stimulation, how often were your erections hard enough for penetration (entering your partner)? A Few Times (much less than half the time)     During sexual intercourse, how often were you able to maintain your erection after you had penetrated (entered) your partner? Almost Never or Never     During sexual intercourse, how difficult was it to maintain your erection to completion of intercourse? Extremely Difficult     When you attempted sexual intercourse, how often was it satisfactory for you? A Few Times (much less than half the time)       SHIM Total Score   SHIM 7        Score: 1-7 Severe ED 8-11 Moderate ED 12-16 Mild-Moderate ED 17-21 Mild ED 22-25 No ED       PMH: Past Medical History:  Diagnosis Date  . Anemia   . Arrhythmia   . Atrial fibrillation ([place])   . COPD (chronic obstructive pulmonary disease) ([place])   . Erectile dysfunction   . Hyperlipidemia   . Hypertension   . Renal insufficiency   . Vitamin D deficiency     Surgical History: Past Surgical History:  Procedure Laterality Date  . COLONOSCOPY WITH PROPOFOL N/A 07/17/2016   Procedure: COLONOSCOPY WITH PROPOFOL;  Surgeon: [name], MD;  Location: [facility] ENDOSCOPY;  Service: Endoscopy;  Laterality: N/A;  . ESOPHAGOGASTRODUODENOSCOPY (EGD) WITH PROPOFOL N/A 07/17/2016   Procedure: ESOPHAGOGASTRODUODENOSCOPY (EGD) WITH PROPOFOL;  Surgeon: [name], MD;  Location: [facility] ENDOSCOPY;  Service: Endoscopy;  Laterality: N/A;  . Head surgery Left     Home Medications:  Allergies as of 03/12/2017      Reactions   Aspirin Itching   Penicillins Hives, Itching, Other (See Comments)   Has patient had a PCN reaction causing immediate rash,  facial/tongue/throat swelling, SOB or lightheadedness with hypotension: no Has patient had a PCN reaction causing severe rash involving mucus membranes or skin necrosis: no Has patient had a PCN  reaction that required hospitalization no Has patient had a PCN reaction occurring within the last 10 years: no If all of the above answers are "NO", then may proceed with Cephalosporin use.      Medication List       Accurate as of 03/12/17 11:26 AM. Always use your most recent med list.          acetaminophen 500 MG tablet Commonly known as:  TYLENOL Take 1,000 mg by mouth every 6 (six) hours as needed for mild pain or headache.   albuterol 108 (90 Base) MCG/ACT inhaler Commonly known as:  PROVENTIL HFA;VENTOLIN HFA Inhale 2 puffs into the lungs every 6 (six) hours as needed for wheezing or shortness of breath.   apixaban 5 MG Tabs tablet Commonly known as:  ELIQUIS Take 1 tablet (5 mg total) by mouth 2 (two) times daily.   Avanafil 200 MG Tabs Commonly known as:  STENDRA Take 1 tablet by mouth daily as needed.   clotrimazole-betamethasone cream Commonly known as:  LOTRISONE Reported on 06/07/2016   diltiazem 180 MG 24 hr capsule Commonly known as:  CARDIZEM CD Take 180 mg by mouth daily.   docusate sodium 100 MG capsule Commonly known as:  COLACE Take 1 capsule (100 mg total) by mouth 2 (two) times daily.   ferrous sulfate 325 (65 FE) MG tablet Take 325 mg by mouth daily. Reported on 06/07/2016   levofloxacin 750 MG tablet Commonly known as:  LEVAQUIN Take 1 tablet (750 mg total) by mouth daily.   meloxicam 7.5 MG tablet Commonly known as:  MOBIC Take 1 tablet (7.5 mg total) by mouth daily.   metoprolol 50 MG tablet Commonly known as:  LOPRESSOR Take 1 tablet (50 mg total) by mouth 2 (two) times daily.   mirabegron ER 50 MG Tb24 tablet Commonly known as:  MYRBETRIQ Take 1 tablet (50 mg total) by mouth daily.   multivitamin with minerals Tabs tablet Take 1 tablet  by mouth daily.   omega-3 acid ethyl esters 1 g capsule Commonly known as:  LOVAZA Take 1 g by mouth daily. Reported on 06/07/2016   pantoprazole 40 MG tablet Commonly known as:  PROTONIX Reported on 06/07/2016   torsemide 20 MG tablet Commonly known as:  DEMADEX Take 1 tablet (20 mg total) by mouth every other day.       Allergies:  Allergies  Allergen Reactions  . Aspirin Itching  . Penicillins Hives, Itching and Other (See Comments)    Has patient had a PCN reaction causing immediate rash, facial/tongue/throat swelling, SOB or lightheadedness with hypotension: no Has patient had a PCN reaction causing severe rash involving mucus membranes or skin necrosis: no Has patient had a PCN reaction that required hospitalization no Has patient had a PCN reaction occurring within the last 10 years: no If all of the above answers are "NO", then may proceed with Cephalosporin use.     Family History: Family History  Problem Relation Age of Onset  . Arthritis Father   . Bladder Cancer Neg Hx   . Kidney cancer Neg Hx   . Prolactinoma Neg Hx   . Prostate cancer Neg Hx     Social History:  reports that he quit smoking about 16 months ago. His smoking use included Cigarettes. He has a 10.00 pack-year smoking history. He has never used smokeless tobacco. He reports that he does not drink alcohol or use drugs.  ROS: UROLOGY Frequent Urination?: Yes Hard to postpone urination?: Yes Burning/pain  with urination?: No Get up at night to urinate?: Yes Leakage of urine?: No Urine stream starts and stops?: No Trouble starting stream?: No Do you have to strain to urinate?: No Blood in urine?: No Urinary tract infection?: No Sexually transmitted disease?: No Injury to kidneys or bladder?: No Painful intercourse?: No Weak stream?: No Erection problems?: No Penile pain?: No  Gastrointestinal Nausea?: No Vomiting?: No Indigestion/heartburn?: No Diarrhea?: No Constipation?:  No  Constitutional Fever: No Night sweats?: No Weight loss?: Yes Fatigue?: No  Skin Skin rash/lesions?: No Itching?: Yes  Eyes Blurred vision?: No Double vision?: No  Ears/Nose/Throat Sore throat?: No Sinus problems?: No  Hematologic/Lymphatic Swollen glands?: No Easy bruising?: No  Cardiovascular Leg swelling?: No Chest pain?: No  Respiratory Cough?: Yes Shortness of breath?: No  Endocrine Excessive thirst?: Yes  Musculoskeletal Back pain?: Yes Joint pain?: No  Neurological Headaches?: No Dizziness?: No  Psychologic Depression?: No Anxiety?: No  Physical Exam: BP 109/63   Pulse 91   Ht 6' (1.829 m)   Wt 125 lb 11.2 oz (57 kg)   BMI 17.05 kg/m   Constitutional: Well nourished. Alert and oriented, No acute distress. HEENT: [place] AT, moist mucus membranes. Trachea midline, no masses. Cardiovascular: No clubbing, cyanosis, or edema. Respiratory: Normal respiratory effort, no increased work of breathing. Skin: No rashes, bruises or suspicious lesions. Lymph: No cervical or inguinal adenopathy. Neurologic: Grossly intact, no focal deficits, moving all 4 extremities. Psychiatric: Normal mood and affect.  Laboratory Data: Lab Results  Component Value Date   WBC 12.3 (H) 10/15/2016   HGB 10.8 (L) 10/15/2016   HCT 31.7 (L) 10/15/2016   MCV 87.3 10/15/2016   PLT 226 10/15/2016    Lab Results  Component Value Date   CREATININE 1.52 (H) 10/15/2016    Lab Results  Component Value Date   HGBA1C 6.5 (H) 03/19/2016    Lab Results  Component Value Date   TSH 2.745 03/19/2016    Lab Results  Component Value Date   AST 24 05/28/2016   Lab Results  Component Value Date   ALT 16 (L) 05/28/2016     Pertinent Imaging: Results for JADIAN, KARMAN (MRN [id number]) as of 03/12/2017 11:17  Ref. Range 03/12/2017 11:10  Scan Result Unknown 27    Assessment & Plan:    1. Urge incontinence  - IPSS score is 16/4, it is worsening  - Continue  conservative management, avoiding bladder irritants and timed voiding's  - most bothersome symptoms is nocturia  - Myrbetriq 50 reduced it by half  - RTC in 3 months for I PSS and PVR   2. Nocturia  - nocturia reduced from 4 times nightly to 3 times nightly  - continue Myrbetriq to 50 mg daily  - RTC in 3 months for I PSS and PVR  3. Erectile dysfunction  - has not tried the [place] samples  Return in about 3 months (around 06/11/2017) for IPSS and PVR.  These notes generated with voice recognition software. I apologize for typographical errors.  Zara Council, [facility] [address], [place], [place] [address] [phone]

## 2017-03-12 ENCOUNTER — Ambulatory Visit (INDEPENDENT_AMBULATORY_CARE_PROVIDER_SITE_OTHER): Payer: Medicare Other | Admitting: Urology

## 2017-03-12 ENCOUNTER — Encounter: Payer: Self-pay | Admitting: Urology

## 2017-03-12 ENCOUNTER — Telehealth: Payer: Self-pay | Admitting: Urology

## 2017-03-12 VITALS — BP 109/63 | HR 91 | Ht 72.0 in | Wt 125.7 lb

## 2017-03-12 DIAGNOSIS — R351 Nocturia: Secondary | ICD-10-CM | POA: Diagnosis not present

## 2017-03-12 DIAGNOSIS — N3941 Urge incontinence: Secondary | ICD-10-CM | POA: Diagnosis not present

## 2017-03-12 DIAGNOSIS — N529 Male erectile dysfunction, unspecified: Secondary | ICD-10-CM | POA: Diagnosis not present

## 2017-03-12 LAB — BLADDER SCAN AMB NON-IMAGING: Scan Result: 27

## 2017-03-12 MED ORDER — MIRABEGRON ER 50 MG PO TB24: 50.0000 mg | ORAL_TABLET | Freq: Every day | ORAL | 3 refills | Status: DC

## 2017-03-12 NOTE — Telephone Encounter (Signed)
Please send my notes to Dr. Guerry Bruin office.

## 2017-03-13 NOTE — Telephone Encounter (Signed)
done

## 2017-04-25 ENCOUNTER — Other Ambulatory Visit: Payer: Self-pay

## 2017-05-11 DIAGNOSIS — R64 Cachexia: Secondary | ICD-10-CM

## 2017-05-11 DIAGNOSIS — M25522 Pain in left elbow: Secondary | ICD-10-CM | POA: Insufficient documentation

## 2017-05-11 DIAGNOSIS — R531 Weakness: Secondary | ICD-10-CM | POA: Insufficient documentation

## 2017-05-11 DIAGNOSIS — M25512 Pain in left shoulder: Secondary | ICD-10-CM | POA: Insufficient documentation

## 2017-05-11 DIAGNOSIS — R630 Anorexia: Secondary | ICD-10-CM | POA: Insufficient documentation

## 2017-05-11 DIAGNOSIS — D729 Disorder of white blood cells, unspecified: Secondary | ICD-10-CM | POA: Insufficient documentation

## 2017-05-11 HISTORY — DX: Pain in left elbow: M25.522

## 2017-05-11 HISTORY — DX: Cachexia: R64

## 2017-05-11 HISTORY — DX: Weakness: R53.1

## 2017-05-28 ENCOUNTER — Other Ambulatory Visit: Payer: Self-pay

## 2017-05-28 ENCOUNTER — Encounter: Payer: Self-pay | Admitting: Gastroenterology

## 2017-05-28 ENCOUNTER — Ambulatory Visit (INDEPENDENT_AMBULATORY_CARE_PROVIDER_SITE_OTHER): Payer: Medicare Other | Admitting: Gastroenterology

## 2017-05-28 VITALS — BP 115/57 | HR 80 | Temp 97.5°F | Ht 73.0 in | Wt 126.0 lb

## 2017-05-28 DIAGNOSIS — D500 Iron deficiency anemia secondary to blood loss (chronic): Secondary | ICD-10-CM

## 2017-05-28 NOTE — Progress Notes (Signed)
Primary Care Physician: Casilda Carls, MD  Primary Gastroenterologist:  Dr. [name]  Chief Complaint  Patient presents with  . Anemia    HPI: Patrick Harding is a 83 y.o. male here for iron deficiency anemia.  The patient was put on iron and states that it causes him to be very constipated.  The patient's recent blood work showed him to have a normal MCV with chronic mild anemia.  The patient had an EGD and colonoscopy by me in the past that showed colon polyps and esophagitis.  This was done approximately 1 year ago.  The patient denies any black stools or bloody stools.  He also denies any abdominal pain nausea vomiting fevers or chills.  The patient does report that he has been losing weight because of poor dentition but now states he is eating again.  The patient also reports that he has been chronically feeling cold.  Current Outpatient Prescriptions  Medication Sig Dispense Refill  . apixaban (ELIQUIS) 2.5 MG TABS tablet Take 2.5 mg by mouth.    . ferrous fumarate (HEMOCYTE - 106 MG FE) 325 (106 Fe) MG TABS tablet Take 325 mg by mouth daily.    . metoprolol (LOPRESSOR) 50 MG tablet Take 1 tablet (50 mg total) by mouth 2 (two) times daily. 60 tablet 2  . Multiple Vitamin (MULTI-VITAMINS) TABS Take by mouth.    Marland Kitchen acetaminophen (TYLENOL) 500 MG tablet Take 1,000 mg by mouth every 6 (six) hours as needed for mild pain or headache.    . albuterol (PROVENTIL HFA;VENTOLIN HFA) 108 (90 Base) MCG/ACT inhaler Inhale 2 puffs into the lungs every 6 (six) hours as needed for wheezing or shortness of breath. (Patient not taking: Reported on 01/30/2017) 1 Inhaler 2  . Avanafil (STENDRA) 200 MG TABS Take 1 tablet by mouth daily as needed. (Patient not taking: Reported on 03/12/2017) 10 tablet 0  . clotrimazole-betamethasone (LOTRISONE) cream Reported on 06/07/2016  0  . diltiazem (CARDIZEM CD) 180 MG 24 hr capsule Take 180 mg by mouth daily.    Marland Kitchen docusate sodium (COLACE) 100 MG capsule Take 1 capsule  (100 mg total) by mouth 2 (two) times daily. (Patient not taking: Reported on 01/30/2017) 10 capsule 0  . ferrous sulfate 325 (65 FE) MG tablet Take 325 mg by mouth daily. Reported on 06/07/2016  0  . levofloxacin (LEVAQUIN) 750 MG tablet Take 1 tablet (750 mg total) by mouth daily. (Patient not taking: Reported on 06/07/2016) 5 tablet 0  . meloxicam (MOBIC) 7.5 MG tablet Take 1 tablet (7.5 mg total) by mouth daily. (Patient not taking: Reported on 01/30/2017) 15 tablet 0  . mirabegron ER (MYRBETRIQ) 50 MG TB24 tablet Take 1 tablet (50 mg total) by mouth daily. (Patient not taking: Reported on 05/28/2017) 30 tablet 3  . omega-3 acid ethyl esters (LOVAZA) 1 g capsule Take 1 g by mouth daily. Reported on 06/07/2016    . pantoprazole (PROTONIX) 40 MG tablet Reported on 06/07/2016  0  . torsemide (DEMADEX) 20 MG tablet Take 1 tablet (20 mg total) by mouth every other day. (Patient not taking: Reported on 05/28/2017)     No current facility-administered medications for this visit.     Allergies as of 05/28/2017 - Review Complete 03/12/2017  Allergen Reaction Noted  . Aspirin Itching 12/30/2015  . Penicillins Hives, Itching, and Other (See Comments) 04/13/2015    ROS:  General: Negative for anorexia, weight loss, fever, chills, fatigue, weakness. ENT: Negative for hoarseness, difficulty swallowing ,  nasal congestion. CV: Negative for chest pain, angina, palpitations, dyspnea on exertion, peripheral edema.  Respiratory: Negative for dyspnea at rest, dyspnea on exertion, cough, sputum, wheezing.  GI: See history of present illness. GU:  Negative for dysuria, hematuria, urinary incontinence, urinary frequency, nocturnal urination.  Endo: Negative for unusual weight change.    Physical Examination:   BP (!) 115/57   Pulse 80   Temp 97.5 F (36.4 C) (Oral)   Ht 6\' 1"  (1.854 m)   Wt 126 lb (57.2 kg)   BMI 16.62 kg/m   General: Well-nourished, well-developed in no acute distress.  Eyes: No  icterus. Conjunctivae pink. Mouth: Oropharyngeal mucosa moist and pink , no lesions erythema or exudate. Lungs: Clear to auscultation bilaterally. Non-labored. Heart: Regular rate and rhythm, no murmurs rubs or gallops.  Abdomen: Bowel sounds are normal, nontender, nondistended, no hepatosplenomegaly or masses, no abdominal bruits or hernia , no rebound or guarding.   Extremities: No lower extremity edema. No clubbing or deformities. Neuro: Alert and oriented x 3.  Grossly intact. Skin: Warm and dry, no jaundice.   Psych: Alert and cooperative, normal mood and affect.  Labs:    Imaging Studies: No results found.  Assessment and Plan:   Patrick Harding is a 83 y.o. y/o male here for iron deficiency anemia.  The patient refuses to take any further iron because he states they caused him to be constipated.  The patient recently had an EGD and colonoscopy without any sign of a source for his anemia.  The patient has a normal MCV and will be set up to have iron studies done.  The patient has been off his iron for the last few weeks.  The patient has been told that if his iron studies are low then he may need further workup for may need to get IV infusions of iron.     [name], MD. Marval Regal   Note: This dictation was prepared with Dragon dictation along with smaller phrase technology. Any transcriptional errors that result from this process are unintentional.

## 2017-06-03 NOTE — Progress Notes (Signed)
06/05/2017 11:50 AM   Patrick Harding Nov 29, 1934 [id number]  Referring provider: Casilda Carls, MD [address] [place], [place] [address]  Chief Complaint  Patient presents with  . Follow-up    URGE INCONTINENCE    HPI: 83 yo AAM with urge incontinence, nocturia and ED who presents today for a 3 month follow up.      Background history Patient is an 83 year old African American male who is referred for incotinence by Dr. Rosario Harding with his wife, Patrick Harding.  His major complaints today are frequency x 5-6, urgency, nocturia x 3, incontinence with urgency x 3 and hesitancy.  He has had these symptoms for one month.  He is wearing three depends daily.  He denies any dysuria, hematuria or suprapubic pain.   He also denies any recent fevers, chills, nausea or vomiting.  His PVR was 16 mL today.  He has had back surgery in the 90's and has received epidurals in his back up until two years ago.  Patient has had old strokes demonstrated on images, but patient denies having any strokes.  This is per wife.  Wife is worried as the patient is in denial about his current medical status.  He stated that he was feeling weak as he did not eat any foods yet today.  We supplied him with crackers and soda and he feels better.  Wife states he does this a lot.  He has noted nothing that made the urge incontinence worse or better.   He drinks two 10 oz waters daily.  He also drinks a lot of sweet tea.    At his visit 3 weeks ago, he was was increased to Myrbetriq 50 mg daily samples.   He has noticed a reduction in his nocturia x 1.    His IPSS score today is 12, which is moderate lower urinary tract symptomatology.  He is mostly dissatisfied with his quality life due to his urinary symptoms.  His PVR is 0 mL.  His previous I PSS score was 16/4.  His previous PVR was 27 mL.    His major complaints today are frequency, urgency and nocturia x 3.   He has had these symptoms for several years.  He denies any dysuria,  hematuria or suprapubic pain.   He is not taking Myrbetriq 50 mg daily at this time.  He also denies any recent fevers, chills, nausea or vomiting.       IPSS    Row Name 06/05/17 1100         International Prostate Symptom Score   How often have you had the sensation of not emptying your bladder? Less than 1 in 5     How often have you had to urinate less than every two hours? About half the time     How often have you found you stopped and started again several times when you urinated? About half the time     How often have you found it difficult to postpone urination? About half the time     How often have you had a weak urinary stream? Not at All     How often have you had to strain to start urination? Not at All     How many times did you typically get up at night to urinate? 2 Times     Total IPSS Score 12       Quality of Life due to urinary symptoms   If you were to  spend the rest of your life with your urinary condition just the way it is now how would you feel about that? Mostly Disatisfied        Score:  1-7 Mild 8-19 Moderate 20-35 Severe   Erectile dysfunction His SHIM score is 7, which is severe ED.  His previous SHIM score was 7.  He has been having difficulty with erections for several.   His major complaint is inability to achieve an erection.  His libido is preserved.   His risk factors for ED are age, BPH, stroke, spinal surgery, DM, HTN, HLD.  He denies any painful erections or curvatures with his erections.   He is no longer having spontaneous erections.   He was given Stendra 200 mg at his last visit, but he did not find it effective at this time.       SHIM    Row Name 06/05/17 1112         SHIM: Over the last 6 months:   How do you rate your confidence that you could get and keep an erection? Very Low     When you had erections with sexual stimulation, how often were your erections hard enough for penetration (entering your partner)? Almost Never or  Never     During sexual intercourse, how often were you able to maintain your erection after you had penetrated (entered) your partner? A Few Times (much less than half the time)     During sexual intercourse, how difficult was it to maintain your erection to completion of intercourse? Extremely Difficult     When you attempted sexual intercourse, how often was it satisfactory for you? A Few Times (much less than half the time)       SHIM Total Score   SHIM 7        Score: 1-7 Severe ED 8-11 Moderate ED 12-16 Mild-Moderate ED 17-21 Mild ED 22-25 No ED  PMH: Past Medical History:  Diagnosis Date  . Anemia   . Arrhythmia   . Atrial fibrillation ([place])   . COPD (chronic obstructive pulmonary disease) ([place])   . Erectile dysfunction   . Hyperlipidemia   . Hypertension   . Renal insufficiency   . Vitamin D deficiency     Surgical History: Past Surgical History:  Procedure Laterality Date  . COLONOSCOPY WITH PROPOFOL N/A 07/17/2016   Procedure: COLONOSCOPY WITH PROPOFOL;  Surgeon: Patrick Lame, MD;  Location: [facility] ENDOSCOPY;  Service: Endoscopy;  Laterality: N/A;  . ESOPHAGOGASTRODUODENOSCOPY (EGD) WITH PROPOFOL N/A 07/17/2016   Procedure: ESOPHAGOGASTRODUODENOSCOPY (EGD) WITH PROPOFOL;  Surgeon: Patrick Lame, MD;  Location: [facility] ENDOSCOPY;  Service: Endoscopy;  Laterality: N/A;  . Head surgery Left     Home Medications:  Allergies as of 06/05/2017      Reactions   Aspirin Itching   Penicillins Hives, Itching, Other (See Comments)   Has patient had a PCN reaction causing immediate rash, facial/tongue/throat swelling, SOB or lightheadedness with hypotension: no Has patient had a PCN reaction causing severe rash involving mucus membranes or skin necrosis: no Has patient had a PCN reaction that required hospitalization no Has patient had a PCN reaction occurring within the last 10 years: no If all of the above answers are "NO", then may proceed with Cephalosporin use.        Medication List       Accurate as of 06/05/17 11:50 AM. Always use your most recent med list.          acetaminophen  500 MG tablet Commonly known as:  TYLENOL Take 1,000 mg by mouth every 6 (six) hours as needed for mild pain or headache.   albuterol 108 (90 Base) MCG/ACT inhaler Commonly known as:  PROVENTIL HFA;VENTOLIN HFA Inhale 2 puffs into the lungs every 6 (six) hours as needed for wheezing or shortness of breath.   apixaban 2.5 MG Tabs tablet Commonly known as:  ELIQUIS Take 1.25 mg by mouth 2 (two) times daily.   Avanafil 200 MG Tabs Commonly known as:  STENDRA Take 1 tablet by mouth daily as needed.   clotrimazole-betamethasone cream Commonly known as:  LOTRISONE Reported on 06/07/2016   diltiazem 180 MG 24 hr capsule Commonly known as:  CARDIZEM CD Take 180 mg by mouth daily.   docusate sodium 100 MG capsule Commonly known as:  COLACE Take 1 capsule (100 mg total) by mouth 2 (two) times daily.   ferrous fumarate 325 (106 Fe) MG Tabs tablet Commonly known as:  HEMOCYTE - 106 mg FE Take 325 mg by mouth daily.   ferrous sulfate 325 (65 FE) MG tablet Take 325 mg by mouth daily. Reported on 06/07/2016   meloxicam 7.5 MG tablet Commonly known as:  MOBIC Take 1 tablet (7.5 mg total) by mouth daily.   metoprolol tartrate 50 MG tablet Commonly known as:  LOPRESSOR Take 1 tablet (50 mg total) by mouth 2 (two) times daily.   mirabegron ER 50 MG Tb24 tablet Commonly known as:  MYRBETRIQ Take 1 tablet (50 mg total) by mouth daily.   MULTI-VITAMINS Tabs Take by mouth.   omega-3 acid ethyl esters 1 g capsule Commonly known as:  LOVAZA Take 1 g by mouth daily. Reported on 06/07/2016   pantoprazole 40 MG tablet Commonly known as:  PROTONIX Reported on 06/07/2016   torsemide 20 MG tablet Commonly known as:  DEMADEX Take 1 tablet (20 mg total) by mouth every other day.       Allergies:  Allergies  Allergen Reactions  . Aspirin Itching  . Penicillins  Hives, Itching and Other (See Comments)    Has patient had a PCN reaction causing immediate rash, facial/tongue/throat swelling, SOB or lightheadedness with hypotension: no Has patient had a PCN reaction causing severe rash involving mucus membranes or skin necrosis: no Has patient had a PCN reaction that required hospitalization no Has patient had a PCN reaction occurring within the last 10 years: no If all of the above answers are "NO", then may proceed with Cephalosporin use.     Family History: Family History  Problem Relation Age of Onset  . Arthritis Father   . Bladder Cancer Neg Hx   . Kidney cancer Neg Hx   . Prolactinoma Neg Hx   . Prostate cancer Neg Hx     Social History:  reports that he quit smoking about 19 months ago. His smoking use included Cigarettes. He has a 10.00 pack-year smoking history. He has never used smokeless tobacco. He reports that he does not drink alcohol or use drugs.  ROS: UROLOGY Frequent Urination?: Yes Hard to postpone urination?: No Burning/pain with urination?: No Get up at night to urinate?: Yes Leakage of urine?: Yes Urine stream starts and stops?: No Trouble starting stream?: No Do you have to strain to urinate?: No Blood in urine?: No Urinary tract infection?: No Sexually transmitted disease?: No Injury to kidneys or bladder?: No Painful intercourse?: No Weak stream?: No Erection problems?: No Penile pain?: No  Gastrointestinal Nausea?: No Vomiting?: No Indigestion/heartburn?: No Diarrhea?:  No Constipation?: No  Constitutional Fever: No Night sweats?: No Weight loss?: Yes Fatigue?: Yes  Skin Skin rash/lesions?: No Itching?: Yes  Eyes Blurred vision?: No Double vision?: No  Ears/Nose/Throat Sore throat?: No Sinus problems?: No  Hematologic/Lymphatic Swollen glands?: No Easy bruising?: No  Cardiovascular Leg swelling?: No Chest pain?: No  Respiratory Cough?: Yes Shortness of breath?:  No  Endocrine Excessive thirst?: No  Musculoskeletal Back pain?: No Joint pain?: Yes  Neurological Headaches?: No Dizziness?: No  Psychologic Depression?: No Anxiety?: Yes  Physical Exam: BP (!) 121/51   Pulse 77   Ht 6' (1.829 m)   Wt 125 lb 14.4 oz (57.1 kg)   BMI 17.08 kg/m   Constitutional: Well nourished. Alert and oriented, No acute distress. HEENT: [place] AT, moist mucus membranes. Trachea midline, no masses. Cardiovascular: No clubbing, cyanosis, or edema. Respiratory: Normal respiratory effort, no increased work of breathing. GI: Abdomen is soft, non tender, non distended, no abdominal masses. Liver and spleen not palpable.  Left inguinal hernia that is easily reducible. Stool sample for occult testing is not indicated.   GU: No CVA tenderness.  No bladder fullness or masses.  Patient with circumcised phallus.  Urethral meatus is patent.  No penile discharge. No penile lesions or rashes. Scrotum without lesions, cysts, rashes and/or edema.  Testicles are located scrotally bilaterally. No masses are appreciated in the testicles. Left and right epididymis are normal. Rectal: Not performed today Skin: No rashes, bruises or suspicious lesions. Lymph: No cervical or inguinal adenopathy. Neurologic: Grossly intact, no focal deficits, moving all 4 extremities. Psychiatric: Normal mood and affect.  Laboratory Data: Lab Results  Component Value Date   WBC 12.3 (H) 10/15/2016   HGB 10.8 (L) 10/15/2016   HCT 31.7 (L) 10/15/2016   MCV 87.3 10/15/2016   PLT 226 10/15/2016    Lab Results  Component Value Date   CREATININE 1.52 (H) 10/15/2016    Pertinent Imaging: Results for Patrick Harding, Patrick Harding (MRN [id number]) as of 06/05/2017 11:33  Ref. Range 06/05/2017 11:29  Scan Result Unknown 0     Assessment & Plan:    1. Urge incontinence  - IPSS score is 12/4, it is stable - patient not taking his Myrbetriq  - Continue conservative management, avoiding bladder irritants and timed  voiding's  - most bothersome symptoms is urge incontinence  - advised patient to restart Myrbetriq  - RTC in 3 months for I PSS and PVR   2. Nocturia  - nocturia reduced from 4 times nightly to 3 times nightly  - restart Myrbetriq to 50 mg daily  - RTC in 3 months for I PSS and PVR  3. Erectile dysfunction  - [place] was ineffective  - does not want to pursue other treatments at this time  4. Left inguinal hernia  - refer to general surgery   Return in about 3 months (around 09/05/2017) for IPSS, PVR and exam.  These notes generated with voice recognition software. I apologize for typographical errors.  Zara Council, [facility] [address] [place], [place] [address] [phone]

## 2017-06-05 ENCOUNTER — Ambulatory Visit (INDEPENDENT_AMBULATORY_CARE_PROVIDER_SITE_OTHER): Payer: Medicare Other | Admitting: Urology

## 2017-06-05 ENCOUNTER — Encounter: Payer: Self-pay | Admitting: Urology

## 2017-06-05 VITALS — BP 121/51 | HR 77 | Ht 72.0 in | Wt 125.9 lb

## 2017-06-05 DIAGNOSIS — R351 Nocturia: Secondary | ICD-10-CM | POA: Diagnosis not present

## 2017-06-05 DIAGNOSIS — N529 Male erectile dysfunction, unspecified: Secondary | ICD-10-CM

## 2017-06-05 DIAGNOSIS — K4090 Unilateral inguinal hernia, without obstruction or gangrene, not specified as recurrent: Secondary | ICD-10-CM | POA: Diagnosis not present

## 2017-06-05 DIAGNOSIS — N3941 Urge incontinence: Secondary | ICD-10-CM | POA: Diagnosis not present

## 2017-06-05 LAB — BLADDER SCAN AMB NON-IMAGING: Scan Result: 0

## 2017-06-10 ENCOUNTER — Other Ambulatory Visit: Payer: Self-pay

## 2017-06-11 ENCOUNTER — Other Ambulatory Visit: Payer: Self-pay | Admitting: Specialist

## 2017-06-11 DIAGNOSIS — R918 Other nonspecific abnormal finding of lung field: Secondary | ICD-10-CM

## 2017-06-14 ENCOUNTER — Ambulatory Visit: Payer: Medicare Other | Admitting: Surgery

## 2017-06-18 ENCOUNTER — Ambulatory Visit
Admission: RE | Admit: 2017-06-18 | Discharge: 2017-06-18 | Disposition: A | Discharge status: Home / Self Care | Payer: Medicare Other | Source: Ambulatory Visit | Attending: Specialist | Admitting: Specialist

## 2017-06-18 DIAGNOSIS — R918 Other nonspecific abnormal finding of lung field: Secondary | ICD-10-CM | POA: Diagnosis present

## 2017-06-18 DIAGNOSIS — M899 Disorder of bone, unspecified: Secondary | ICD-10-CM | POA: Insufficient documentation

## 2017-06-18 LAB — GLUCOSE, CAPILLARY: Glucose-Capillary: 73 mg/dL (ref 65–99)

## 2017-06-18 MED ORDER — FLUDEOXYGLUCOSE F - 18 (FDG) INJECTION
12.0000 | Freq: Once | INTRAVENOUS | Status: AC | PRN
  Administered 2017-06-18: 12.58 via INTRAVENOUS

## 2017-06-19 ENCOUNTER — Emergency Department
Admission: EM | Admit: 2017-06-19 | Discharge: 2017-06-19 | Discharge status: Against Medical Advice | Payer: Medicare Other | Attending: Emergency Medicine | Admitting: Emergency Medicine

## 2017-06-19 ENCOUNTER — Emergency Department: Payer: Medicare Other

## 2017-06-19 DIAGNOSIS — Z79899 Other long term (current) drug therapy: Secondary | ICD-10-CM | POA: Insufficient documentation

## 2017-06-19 DIAGNOSIS — J449 Chronic obstructive pulmonary disease, unspecified: Secondary | ICD-10-CM | POA: Insufficient documentation

## 2017-06-19 DIAGNOSIS — R51 Headache: Secondary | ICD-10-CM | POA: Diagnosis present

## 2017-06-19 DIAGNOSIS — I5022 Chronic systolic (congestive) heart failure: Secondary | ICD-10-CM | POA: Diagnosis not present

## 2017-06-19 DIAGNOSIS — I110 Hypertensive heart disease with heart failure: Secondary | ICD-10-CM | POA: Diagnosis not present

## 2017-06-19 DIAGNOSIS — Z87891 Personal history of nicotine dependence: Secondary | ICD-10-CM | POA: Diagnosis not present

## 2017-06-19 DIAGNOSIS — R471 Dysarthria and anarthria: Secondary | ICD-10-CM | POA: Diagnosis not present

## 2017-06-19 DIAGNOSIS — Z791 Long term (current) use of non-steroidal anti-inflammatories (NSAID): Secondary | ICD-10-CM | POA: Diagnosis not present

## 2017-06-19 DIAGNOSIS — M542 Cervicalgia: Secondary | ICD-10-CM | POA: Diagnosis not present

## 2017-06-19 DIAGNOSIS — Z7901 Long term (current) use of anticoagulants: Secondary | ICD-10-CM | POA: Insufficient documentation

## 2017-06-19 NOTE — ED Notes (Signed)
Attempt to draw labs X 1, unsuccessful. Pt refusing this allow this RN for a second attempt. Pt states that he does not want to be here.

## 2017-06-19 NOTE — ED Notes (Signed)
Pt refusing MRI at this time and is putting one clothing. RN attempted to talk to pt and convince him to stay for MRI. PT stated "you have all been sitting on your ass not doing anything and now you want to do something. I don't want to stay here any longer." Pts wife was on the phone with MRI and RN told pt he would going to MRI in the next 10 minutes but pt verbalized he did not believe this RN and continued to dress and verbalize desire to leave.

## 2017-06-19 NOTE — ED Provider Notes (Signed)
[facility] Emergency Department Provider Note  ____________________________________________  Time seen: Approximately 6:36 PM  I have reviewed the triage vital signs and the nursing notes.   HISTORY  Chief Complaint Headache    HPI Patrick Harding is a 83 y.o. male with a history of dysarthria and mild right-sided weakness due to prior strokes. For the last 2-3 days, symptoms seem to be a little bit worse according to his wife. He also complains of bilateral neck pain radiating up into the occipital head. No vision changes. No dizziness or syncope. Symptoms have been constant, no specific aggravating or alleviating factors. Mild to moderate in intensity. No trauma. No vomiting or fever.     Past Medical History:  Diagnosis Date  . Anemia   . Aortic aneurysm without rupture ([place]) 12/26/2015  . Arrhythmia   . Atrial fibrillation ([place])   . Benign neoplasm of cecum   . Benign neoplasm of transverse colon   . Blood in stool   . Cachexia ([place]) 05/11/2017  . CAP (community acquired pneumonia) 03/20/2016  . Chronic systolic CHF (congestive heart failure), NYHA class 3 ([place]) 12/26/2015   Overview:  Global ef 25%  . COPD (chronic obstructive pulmonary disease) ([place])   . Dysarthria due to recent stroke 03/12/2016  . Dyspnea on exertion 01/03/2016  . Erectile dysfunction   . Essential hypertension 04/14/2015  . Gastritis   . Generalized weakness 05/11/2017  . Hyperlipidemia   . Hypertension   . Left elbow pain 05/11/2017  . Lower extremity weakness (Right) 01/24/2016  . 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 Lumbar facet syndrome 08/10/2015  . Lumbar foraminal stenosis (Left Moderate-severe L4-5) (Bilateral Moderate L5-S1) 03/12/2016  . Nonrheumatic aortic valve insufficiency 01/03/2016  . Panlobular emphysema ([place]) 01/03/2016  . Persistent atrial fibrillation ([place]) 04/14/2015   Overview:  Last Assessment & Plan:  His ventricular rate continues to be elevated. Thus, I elected to  increase the dose of metoprolol to 50 mg twice daily. Continue anticoagulation. He is complaining of increased dyspnea but his oxygen saturation is 95% on room air and his lungs are relatively clear but he does have diminished breath sounds. EKG does not show any new changes. Once his heart rate i  . Pressure ulcer 03/21/2016  . Rectal polyp   . Reflux esophagitis   . Renal insufficiency   . Sacroiliac joint dysfunction 08/10/2015  . Stroke determined by clinical assessment ([place]) 03/12/2016  . Thoracic ascending aortic aneurysm ([place]) 01/11/2016   Overview:  4.8cm 12/2015  . Vitamin D deficiency   . Weakness of both legs 12/26/2015     Patient Active Problem List   Diagnosis Date Noted  . Acute pain of left shoulder 05/11/2017  . Loss of appetite 05/11/2017  . Cachexia ([place]) 05/11/2017  . Generalized weakness 05/11/2017  . Left elbow pain 05/11/2017  . Neutrophilia 05/11/2017  . Blood in stool   . Benign neoplasm of cecum   . Rectal polyp   . Benign neoplasm of transverse colon   . Reflux esophagitis   . Gastritis   . HLD (hyperlipidemia) 03/28/2016  . AKI (acute kidney injury) ([place]) 03/28/2016  . 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 Pressure ulcer 03/21/2016  . CAP (community acquired pneumonia) 03/20/2016  . Protein-calorie malnutrition, severe 03/20/2016  . Speech and language deficits 03/12/2016  . Dysarthria due to recent stroke 03/12/2016  . Stroke determined by clinical assessment ([place]) 03/12/2016  . Lumbar foraminal stenosis (Left Moderate-severe L4-5) (Bilateral Moderate L5-S1) 03/12/2016  . Abnormal  MRI, lumbar spine (2012) 01/24/2016  . Chronic low back pain (Location of Secondary source of pain) (Bilateral) (R>L) 01/24/2016  . Chronic midline back pain 01/24/2016  . Chronic lumbar radicular pain (Location of Primary Source of Pain) (Right) (L3/L4 dermatome) 01/24/2016  . Chronic pain 01/24/2016  . Chronic anticoagulation 01/24/2016  . Long-term use of high-risk medication  01/24/2016  . 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 Lower extremity weakness (Right) 01/24/2016  . Thoracic ascending aortic aneurysm ([place]) 01/11/2016  . Dyspnea on exertion 01/03/2016  . Former cigarette smoker 01/03/2016  . Nonrheumatic aortic valve insufficiency 01/03/2016  . Panlobular emphysema ([place]) 01/03/2016  . Weight loss, abnormal 12/26/2015  . Weakness of both legs 12/26/2015  . Pulmonary nodule, right 12/26/2015  . Aortic aneurysm without rupture ([place]) 12/26/2015  . Chronic systolic CHF (congestive heart failure), NYHA class 3 ([place]) 12/26/2015  . Spinal stenosis, lumbar region, with neurogenic claudication 08/10/2015  . Lumbar facet syndrome 08/10/2015  . Sacroiliac joint dysfunction 08/10/2015  . Atrial fibrillation ([place]) 04/14/2015  . Essential hypertension 04/14/2015  . Persistent atrial fibrillation ([place]) 04/14/2015     Past Surgical History:  Procedure Laterality Date  . COLONOSCOPY WITH PROPOFOL N/A 07/17/2016   Procedure: COLONOSCOPY WITH PROPOFOL;  Surgeon: [name], MD;  Location: [facility] ENDOSCOPY;  Service: Endoscopy;  Laterality: N/A;  . ESOPHAGOGASTRODUODENOSCOPY (EGD) WITH PROPOFOL N/A 07/17/2016   Procedure: ESOPHAGOGASTRODUODENOSCOPY (EGD) WITH PROPOFOL;  Surgeon: [name], MD;  Location: [facility] ENDOSCOPY;  Service: Endoscopy;  Laterality: N/A;  . Head surgery Left      Prior to Admission medications   Medication Sig Start Date End Date Taking? Authorizing Provider  acetaminophen (TYLENOL) 500 MG tablet Take 1,000 mg by mouth every 6 (six) hours as needed for mild pain or headache.    [provider]  albuterol (PROVENTIL HFA;VENTOLIN HFA) 108 (90 Base) MCG/ACT inhaler Inhale 2 puffs into the lungs every 6 (six) hours as needed for wheezing or shortness of breath. 12/07/15   Daymon Larsen, MD  apixaban (ELIQUIS) 2.5 MG TABS tablet Take 1.25 mg by mouth 2 (two) times daily.  05/11/17   [provider]   Avanafil (STENDRA) 200 MG TABS Take 1 tablet by mouth daily as needed. 02/20/17   Zara Council A, PA-C  clotrimazole-betamethasone (LOTRISONE) cream Reported on 06/07/2016 04/02/16   [provider]  diltiazem (CARDIZEM CD) 180 MG 24 hr capsule Take 180 mg by mouth daily.    [provider]  docusate sodium (COLACE) 100 MG capsule Take 1 capsule (100 mg total) by mouth 2 (two) times daily. 03/22/16   Gouru, Illene Silver, MD  ferrous fumarate (HEMOCYTE - 106 MG FE) 325 (106 Fe) MG TABS tablet Take 325 mg by mouth daily.    [provider]  ferrous sulfate 325 (65 FE) MG tablet Take 325 mg by mouth daily. Reported on 06/07/2016 05/28/16   [provider]  meloxicam (MOBIC) 7.5 MG tablet Take 1 tablet (7.5 mg total) by mouth daily. 10/15/16 10/15/17  Loney Hering, MD  metoprolol (LOPRESSOR) 50 MG tablet Take 1 tablet (50 mg total) by mouth 2 (two) times daily. 12/06/15   Wellington Hampshire, MD  mirabegron ER (MYRBETRIQ) 50 MG TB24 tablet Take 1 tablet (50 mg total) by mouth daily. 03/12/17   Zara Council A, PA-C  Multiple Vitamin (MULTI-VITAMINS) TABS Take by mouth.    [provider]  omega-3 acid ethyl esters (LOVAZA) 1 g capsule Take 1 g  by mouth daily. Reported on 06/07/2016    [provider]  pantoprazole (PROTONIX) 40 MG tablet Reported on 06/07/2016 05/25/16   [provider]  torsemide (DEMADEX) 20 MG tablet Take 1 tablet (20 mg total) by mouth every other day. 03/28/16   Hillary Bow, MD     Allergies Aspirin and Penicillins   Family History  Problem Relation Age of Onset  . Arthritis Father   . Bladder Cancer Neg Hx   . Kidney cancer Neg Hx   . Prolactinoma Neg Hx   . Prostate cancer Neg Hx     Social History Social History  Substance Use Topics  . Smoking status: Former Smoker    Packs/day: 0.50    Years: 20.00    Types: Cigarettes    Quit date: 10/24/2015  . Smokeless tobacco: Never Used  . Alcohol use No      Comment: occasional    Review of Systems  Constitutional:   No fever or chills.  ENT:   No sore throat. No rhinorrhea. Cardiovascular:   No chest pain or syncope. Respiratory:   No dyspnea or cough. Gastrointestinal:   Negative for abdominal pain, vomiting and diarrhea.  Musculoskeletal:   Bilateral neck pain as above. No stiffness. All other systems reviewed and are negative except as documented above in ROS and HPI.  ____________________________________________   PHYSICAL EXAM:  VITAL SIGNS: ED Triage Vitals  Enc Vitals Group     BP 06/19/17 1412 (!) 110/52     Pulse Rate 06/19/17 1412 66     Resp 06/19/17 1412 16     Temp 06/19/17 1412 97.8 F (36.6 C)     Temp Source 06/19/17 1412 Oral     SpO2 06/19/17 1412 98 %     Weight 06/19/17 1412 126 lb (57.2 kg)     Height 06/19/17 1412 6' (1.829 m)     Head Circumference --      Peak Flow --      Pain Score 06/19/17 1413 10     Pain Loc --      Pain Edu? --      Excl. in [place]? --     Vital signs reviewed, nursing assessments reviewed.   Constitutional:   Alert and oriented. Well appearing and in no distress. Eyes:   No scleral icterus.  EOMI. No nystagmus. No conjunctival pallor. PERRL. ENT   Head:   Normocephalic and atraumatic.   Nose:   No congestion/rhinnorhea.    Mouth/Throat:   MMM, no pharyngeal erythema. No peritonsillar mass.    Neck:   No meningismus. Full ROM. Nontender in the area of indicated pain in the paraspinous musculature bilaterally Hematological/Lymphatic/Immunilogical:   No cervical lymphadenopathy. Cardiovascular:   Bradycardia heart rate in the 50s. Symmetric bilateral radial and DP pulses.  No murmurs.  Respiratory:   Normal respiratory effort without tachypnea/retractions. Breath sounds are clear and equal bilaterally. No wheezes/rales/rhonchi. Gastrointestinal:   Soft and nontender. Non distended. There is no CVA tenderness.  No rebound, rigidity, or guarding. Genitourinary:    deferred Musculoskeletal:   Normal range of motion in all extremities. No joint effusions.  No lower extremity tenderness.  No edema. Neurologic:   Dysarthric speech. Normal language.  4 out of 5 strength right upper extremity 4-5 strength right lower extremity No pronator drift NIH stroke scale 3, likely chronic. No gross focal neurologic deficits are appreciated.  Skin:    Skin is warm, dry and intact. No rash noted.  No  petechiae, purpura, or bullae.  ____________________________________________    LABS (pertinent positives/negatives) (all labs ordered are listed, but only abnormal results are displayed) Labs Reviewed  BASIC METABOLIC PANEL  CBC  URINALYSIS, COMPLETE (UACMP) WITH MICROSCOPIC  PROTIME-INR  APTT  CBG MONITORING, ED   ____________________________________________   EKG  Interpreted by me Atrial fibrillation, bradycardic rate of 54. Normal axis. Poor R-wave progression in anterior precordial leads. Normal ST segments and T waves, no acute ischemic changes.  ____________________________________________    RADIOLOGY  Ct Head Wo Contrast  Result Date: 06/19/2017 CLINICAL DATA:  Headache in slurred speech for 3 days with neck pain. EXAM: CT HEAD WITHOUT CONTRAST TECHNIQUE: Contiguous axial images were obtained from the base of the skull through the vertex without intravenous contrast. COMPARISON:  07/27/2005. FINDINGS: Brain: No evidence of an acute infarct, acute hemorrhage, mass lesion, mass effect or hydrocephalus. Atrophy. Periventricular low attenuation. Vascular: No hyperdense vessel or unexpected calcification. Skull: Postoperative changes along the anterior right maxillary sinus and right zygomatic arch. 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 Sinuses/Orbits: No acute finding. Other: None. IMPRESSION: 1. No acute intracranial abnormality. 2. Atrophy and chronic microvascular white matter ischemic changes.  Electronically Signed   By: Lorin Picket M.D.   On: 06/19/2017 15:04    ____________________________________________   PROCEDURES Procedures  ____________________________________________   INITIAL IMPRESSION / ASSESSMENT AND PLAN / ED COURSE  Pertinent labs & imaging results that were available during my care of the patient were reviewed by me and considered in my medical decision making (see chart for details).  Patient presents with bilateral neck pain radiating up into the head. He has chronic dysarthria and right-sided weakness from prior strokes, possibly slightly worse today. He refuses any blood draws but initially agreed to MRI and MRA of the head and neck for further evaluation. However, he refuses the MRI to at 6:35 PM. He is getting dressed and insisted on leaving immediately. He has medical decision-making capacity will be discharged from the ED [place]. He has been advised to follow up with his primary care doctor right away and to come back to the ED as soon as he can if he changes his mind and wishes to continue his evaluation. At this point I have a low suspicion for acute stroke but he does have significant risk factors including prior stroke that would necessitate further evaluation today if the patient were willing.      ____________________________________________   FINAL CLINICAL IMPRESSION(S) / ED DIAGNOSES  Final diagnoses:  Neck pain  Dysarthria      New Prescriptions   No medications on file     Portions of this note were generated with dragon dictation software. Dictation errors may occur despite best attempts at proofreading.    Carrie Mew, MD 06/19/17 [phone]

## 2017-06-19 NOTE — ED Notes (Signed)
Patient refused blood draw.

## 2017-06-19 NOTE — ED Notes (Signed)
Pt upset when RN entered the room and verbalized "you guys just ask the same question over and over again." Pt continues to refuse blood work and denies having a headache. Wife reports pt has complained of  Headache for the past couple days and has had slurred speech as well. Pt became upset when wife stated this and verbalized, " now you just sit over there and shut up. I know what is wrong with me and what is not."

## 2017-06-19 NOTE — ED Triage Notes (Signed)
Pt headache X 3 days, slurred speech X 3 days and neck pain. Pt alert and oriented X4, active, cooperative, pt in NAD. RR even and unlabored, color WNL.

## 2017-06-19 NOTE — Discharge Instructions (Signed)
We were unable to evaluate your neck pain today because you refused blood tests and MRIs of the head and neck.  Please follow up with your doctor as soon as possible.  Return to the ER immediately if you change your mind and wish to continue your evaluation. Come back right away if your symptoms worsen in any way.

## 2017-08-15 ENCOUNTER — Emergency Department
Admission: EM | Admit: 2017-08-15 | Discharge: 2017-08-15 | Disposition: A | Discharge status: Home / Self Care | Payer: No Typology Code available for payment source | Attending: Emergency Medicine | Admitting: Emergency Medicine

## 2017-08-15 ENCOUNTER — Encounter: Payer: Self-pay | Admitting: Emergency Medicine

## 2017-08-15 DIAGNOSIS — S39012A Strain of muscle, fascia and tendon of lower back, initial encounter: Secondary | ICD-10-CM | POA: Diagnosis not present

## 2017-08-15 DIAGNOSIS — G8929 Other chronic pain: Secondary | ICD-10-CM | POA: Insufficient documentation

## 2017-08-15 DIAGNOSIS — I5022 Chronic systolic (congestive) heart failure: Secondary | ICD-10-CM | POA: Diagnosis not present

## 2017-08-15 DIAGNOSIS — Z87891 Personal history of nicotine dependence: Secondary | ICD-10-CM | POA: Insufficient documentation

## 2017-08-15 DIAGNOSIS — Z7901 Long term (current) use of anticoagulants: Secondary | ICD-10-CM | POA: Diagnosis not present

## 2017-08-15 DIAGNOSIS — Y939 Activity, unspecified: Secondary | ICD-10-CM | POA: Diagnosis not present

## 2017-08-15 DIAGNOSIS — M545 Low back pain: Secondary | ICD-10-CM | POA: Diagnosis present

## 2017-08-15 DIAGNOSIS — Y92410 Unspecified street and highway as the place of occurrence of the external cause: Secondary | ICD-10-CM | POA: Insufficient documentation

## 2017-08-15 DIAGNOSIS — J449 Chronic obstructive pulmonary disease, unspecified: Secondary | ICD-10-CM | POA: Diagnosis not present

## 2017-08-15 DIAGNOSIS — Y999 Unspecified external cause status: Secondary | ICD-10-CM | POA: Diagnosis not present

## 2017-08-15 DIAGNOSIS — I110 Hypertensive heart disease with heart failure: Secondary | ICD-10-CM | POA: Diagnosis not present

## 2017-08-15 DIAGNOSIS — Z791 Long term (current) use of non-steroidal anti-inflammatories (NSAID): Secondary | ICD-10-CM | POA: Insufficient documentation

## 2017-08-15 DIAGNOSIS — Z79899 Other long term (current) drug therapy: Secondary | ICD-10-CM | POA: Diagnosis not present

## 2017-08-15 MED ORDER — CYCLOBENZAPRINE HCL 5 MG PO TABS: 5.0000 mg | ORAL_TABLET | Freq: Three times a day (TID) | ORAL | 0 refills | Status: DC | PRN

## 2017-08-15 NOTE — ED Notes (Signed)
Pt has wheeled himself out into hallway to talk to wife who is in room next to his.
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
## 2017-08-15 NOTE — Discharge Instructions (Signed)
Please take Tylenol and/or Flexeril as needed for lower back pain. Ice 20 minutes every hour for the next 2-3 days then transitioned over to heat. Return to the ER for any worsening symptoms are to changes in health.

## 2017-09-04 NOTE — Progress Notes (Deleted)
09/05/2017 8:34 PM   Patrick Harding 1934/09/29 [id number]  Referring provider: Casilda Carls, [address] [place], [place] [address]  No chief complaint on file.   HPI: 83 yo AAM with urge incontinence, nocturia and ED who presents today for a 3 month follow up.      Background history Patient is an 83 year old African American male who is referred for incotinence by Dr. Rosario Jacks with his wife, [name].  His major complaints today are frequency x 5-6, urgency, nocturia x 3, incontinence with urgency x 3 and hesitancy.  He has had these symptoms for one month.  He is wearing three depends daily.  He denies any dysuria, hematuria or suprapubic pain.   He also denies any recent fevers, chills, nausea or vomiting.  His PVR was 16 mL today.  He has had back surgery in the 90's and has received epidurals in his back up until two years ago.  Patient has had old strokes demonstrated on images, but patient denies having any strokes.  This is per wife.  Wife is worried as the patient is in denial about his current medical status.  He stated that he was feeling weak as he did not eat any foods yet today.  We supplied him with crackers and soda and he feels better.  Wife states he does this a lot.  He has noted nothing that made the urge incontinence worse or better.   He drinks two 10 oz waters daily.  He also drinks a lot of sweet tea.    BPH with LUTS His IPSS score today is ***, which is *** lower urinary tract symptomatology.  He is *** with his quality life due to his urinary symptoms.  His PVR is *** mL.  His previous I PSS score was 12/4.  His previous PVR was 0 mL.    His major complaints today are frequency, urgency and nocturia x 3.   He has had these symptoms for several years.  He denies any dysuria, hematuria or suprapubic pain.   He is not taking Myrbetriq 50 mg daily at this time.  He also denies any recent fevers, chills, nausea or vomiting.     Score:  1-7 Mild 8-19  Moderate 20-35 Severe   Erectile dysfunction His SHIM score is 7, which is severe ED.  His previous SHIM score was 7.  He has been having difficulty with erections for several.   His major complaint is inability to achieve an erection.  His libido is preserved.   His risk factors for ED are age, BPH, stroke, spinal surgery, DM, HTN, HLD.  He denies any painful erections or curvatures with his erections.   He is no longer having spontaneous erections.   He was given Stendra 200 mg at his last visit, but he did not find it effective at this time.     Score: 1-7 Severe ED 8-11 Moderate ED 12-16 Mild-Moderate ED 17-21 Mild ED 22-25 No ED  PMH: Past Medical History:  Diagnosis Date  . Anemia   . Aortic aneurysm without rupture ([place]) 12/26/2015  . Arrhythmia   . Atrial fibrillation ([place])   . Benign neoplasm of cecum   . Benign neoplasm of transverse colon   . Blood in stool   . Cachexia ([place]) 05/11/2017  . CAP (community acquired pneumonia) 03/20/2016  . Chronic systolic CHF (congestive heart failure), NYHA class 3 ([place]) 12/26/2015   Overview:  Global ef 25%  . COPD (chronic  obstructive pulmonary disease) ([place])   . Dysarthria due to recent stroke 03/12/2016  . Dyspnea on exertion 01/03/2016  . Erectile dysfunction   . Essential hypertension 04/14/2015  . Gastritis   . Generalized weakness 05/11/2017  . Hyperlipidemia   . Hypertension   . Left elbow pain 05/11/2017  . Lower extremity weakness (Right) 01/24/2016  . 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 Lumbar facet syndrome 08/10/2015  . Lumbar foraminal stenosis (Left Moderate-severe L4-5) (Bilateral Moderate L5-S1) 03/12/2016  . Nonrheumatic aortic valve insufficiency 01/03/2016  . Panlobular emphysema ([place]) 01/03/2016  . Persistent atrial fibrillation ([place]) 04/14/2015   Overview:  Last Assessment & Plan:  His ventricular rate continues to be elevated. Thus, I elected to increase the dose of metoprolol to 50 mg twice daily. Continue anticoagulation. He  is complaining of increased dyspnea but his oxygen saturation is 95% on room air and his lungs are relatively clear but he does have diminished breath sounds. EKG does not show any new changes. Once his heart rate i  . Pressure ulcer 03/21/2016  . Rectal polyp   . Reflux esophagitis   . Renal insufficiency   . Sacroiliac joint dysfunction 08/10/2015  . Stroke determined by clinical assessment ([place]) 03/12/2016  . Thoracic ascending aortic aneurysm ([place]) 01/11/2016   Overview:  4.8cm 12/2015  . Vitamin D deficiency   . Weakness of both legs 12/26/2015    Surgical History: Past Surgical History:  Procedure Laterality Date  . COLONOSCOPY WITH PROPOFOL N/A 07/17/2016   Procedure: COLONOSCOPY WITH PROPOFOL;  Surgeon: [name], MD;  Location: [facility] ENDOSCOPY;  Service: Endoscopy;  Laterality: N/A;  . ESOPHAGOGASTRODUODENOSCOPY (EGD) WITH PROPOFOL N/A 07/17/2016   Procedure: ESOPHAGOGASTRODUODENOSCOPY (EGD) WITH PROPOFOL;  Surgeon: [name], MD;  Location: [facility] ENDOSCOPY;  Service: Endoscopy;  Laterality: N/A;  . Head surgery Left     Home Medications:  Allergies as of 09/05/2017      Reactions   Aspirin Itching   Penicillins Hives, Itching, Other (See Comments)   Has patient had a PCN reaction causing immediate rash, facial/tongue/throat swelling, SOB or lightheadedness with hypotension: no Has patient had a PCN reaction causing severe rash involving mucus membranes or skin necrosis: no Has patient had a PCN reaction that required hospitalization no Has patient had a PCN reaction occurring within the last 10 years: no If all of the above answers are "NO", then may proceed with Cephalosporin use.      Medication List       Accurate as of 09/04/17  8:34 PM. Always use your most recent med list.          acetaminophen 500 MG tablet Commonly known as:  TYLENOL Take 1,000 mg by mouth every 6 (six) hours as needed for mild pain or headache.   albuterol 108 (90 Base) MCG/ACT inhaler Commonly  known as:  PROVENTIL HFA;VENTOLIN HFA Inhale 2 puffs into the lungs every 6 (six) hours as needed for wheezing or shortness of breath.   apixaban 2.5 MG Tabs tablet Commonly known as:  ELIQUIS Take 1.25 mg by mouth 2 (two) times daily.   Avanafil 200 MG Tabs Commonly known as:  STENDRA Take 1 tablet by mouth daily as needed.   clotrimazole-betamethasone cream Commonly known as:  LOTRISONE Reported on 06/07/2016   cyclobenzaprine 5 MG tablet Commonly known as:  FLEXERIL Take 1 tablet (5 mg total) by mouth 3 (three) times daily as needed for muscle spasms.   diltiazem 180 MG 24 hr capsule Commonly known as:  CARDIZEM CD Take 180 mg by mouth daily.   docusate sodium 100 MG capsule Commonly known as:  COLACE Take 1 capsule (100 mg total) by mouth 2 (two) times daily.   ferrous fumarate 325 (106 Fe) MG Tabs tablet Commonly known as:  HEMOCYTE - 106 mg FE Take 325 mg by mouth daily.   ferrous sulfate 325 (65 FE) MG tablet Take 325 mg by mouth daily. Reported on 06/07/2016   meloxicam 7.5 MG tablet Commonly known as:  MOBIC Take 1 tablet (7.5 mg total) by mouth daily.   metoprolol tartrate 50 MG tablet Commonly known as:  LOPRESSOR Take 1 tablet (50 mg total) by mouth 2 (two) times daily.   mirabegron ER 50 MG Tb24 tablet Commonly known as:  MYRBETRIQ Take 1 tablet (50 mg total) by mouth daily.   MULTI-VITAMINS Tabs Take by mouth.   omega-3 acid ethyl esters 1 g capsule Commonly known as:  LOVAZA Take 1 g by mouth daily. Reported on 06/07/2016   pantoprazole 40 MG tablet Commonly known as:  PROTONIX Reported on 06/07/2016   torsemide 20 MG tablet Commonly known as:  DEMADEX Take 1 tablet (20 mg total) by mouth every other day.       Allergies:  Allergies  Allergen Reactions  . Aspirin Itching  . Penicillins Hives, Itching and Other (See Comments)    Has patient had a PCN reaction causing immediate rash, facial/tongue/throat swelling, SOB or lightheadedness  with hypotension: no Has patient had a PCN reaction causing severe rash involving mucus membranes or skin necrosis: no Has patient had a PCN reaction that required hospitalization no Has patient had a PCN reaction occurring within the last 10 years: no If all of the above answers are "NO", then may proceed with Cephalosporin use.     Family History: Family History  Problem Relation Age of Onset  . Arthritis Father   . Bladder Cancer Neg Hx   . Kidney cancer Neg Hx   . Prolactinoma Neg Hx   . Prostate cancer Neg Hx     Social History:  reports that he quit smoking about 22 months ago. His smoking use included Cigarettes. He has a 10.00 pack-year smoking history. He has never used smokeless tobacco. He reports that he does not drink alcohol or use drugs.  ROS:                                        Physical Exam: There were no vitals taken for this visit.  Constitutional: Well nourished. Alert and oriented, No acute distress. HEENT: [place] AT, moist mucus membranes. Trachea midline, no masses. Cardiovascular: No clubbing, cyanosis, or edema. Respiratory: Normal respiratory effort, no increased work of breathing. GI: Abdomen is soft, non tender, non distended, no abdominal masses. Liver and spleen not palpable.  Left inguinal hernia that is easily reducible. Stool sample for occult testing is not indicated.   GU: No CVA tenderness.  No bladder fullness or masses.  Patient with circumcised phallus.  Urethral meatus is patent.  No penile discharge. No penile lesions or rashes. Scrotum without lesions, cysts, rashes and/or edema.  Testicles are located scrotally bilaterally. No masses are appreciated in the testicles. Left and right epididymis are normal. Rectal: Not performed today Skin: No rashes, bruises or suspicious lesions. Lymph: No cervical or inguinal adenopathy. Neurologic: Grossly intact, no focal deficits, moving all 4 extremities. Psychiatric: Normal  mood and affect.  Laboratory Data: Lab Results  Component Value Date   WBC 12.3 (H) 10/15/2016   HGB 10.8 (L) 10/15/2016   HCT 31.7 (L) 10/15/2016   MCV 87.3 10/15/2016   PLT 226 10/15/2016    Lab Results  Component Value Date   CREATININE 1.52 (H) 10/15/2016   I have reviewed labs  Pertinent Imaging: ***    Assessment & Plan:    1. Urge incontinence  - IPSS score is 12/4, it is stable - patient not taking his Myrbetriq  - Continue conservative management, avoiding bladder irritants and timed voiding's  - most bothersome symptoms is urge incontinence  - advised patient to restart Myrbetriq  - RTC in 3 months for I PSS and PVR   2. Nocturia  - nocturia reduced from 4 times nightly to 3 times nightly  - restart Myrbetriq to 50 mg daily  - RTC in 3 months for I PSS and PVR  3. Erectile dysfunction  - [place] was ineffective  - does not want to pursue other treatments at this time  4. Left inguinal hernia  - refer to general surgery   No Follow-up on file.  These notes generated with voice recognition software. I apologize for typographical errors.  Zara Council, [facility] [address], [place], [place] [address] [phone]

## 2017-09-05 ENCOUNTER — Ambulatory Visit: Payer: Medicare Other | Admitting: Urology

## 2017-09-05 ENCOUNTER — Encounter: Payer: Self-pay | Admitting: Urology

## 2018-01-16 NOTE — Progress Notes (Addendum)
[facility] Consultation      Assessment and Plan:  Addendum, lung cancer conference 02/06/18. Patient's biopsies were negative, EBUS biopsy samples showed positive lymph nodes without involvement of cancer.  Discussed with patient, given poor functional status would not recommend mediastinoscopy with other interventional biopsies at this time.  We will repeat PET scan in 3-4 months time, in the meantime I will check serology.  Patient and wife in agreement.  Bihilar lymphadenopathy, with increased PET uptake in left hilar node particularly, 30 pound weight loss in 1 year, appears suspicious for lung cancer. - Long discussion with patient and wife, I suspect that he has lung cancer, in order to treat this we would need to perform a biopsy which would involve bronchoscopy.  Given his comorbidities, and  poor functional status, I explained potential risks, which are slightly higher in him than the average person given above.  He is not certain whether he wants to proceed or not, and would like to think about it. -I asked him to call us back before his scheduled follow-up should he want to proceed with biopsy.  COPD with chronic bronchitis, and excess mucus production with chronic cough. -Patient is suspicious of starting inhalers, however he is agreeable to a trial of Spiriva. - Given prescription for Spiriva to see if this helps with his breathing and excess mucus production.  I have also recommended to him and his wife that he could try Mucinex over-the-counter which may also be helpful.  Unstable gait, debility, deconditioning. - Will refer to home PT to see if we can help improve his strength.   Meds ordered this encounter  Medications  . tiotropium (SPIRIVA) 18 MCG inhalation capsule    Sig: Place 1 capsule (18 mcg total) into inhaler and inhale daily.    Dispense:  30 capsule    Refill:  6   Return in about 6 weeks (around 02/28/2018).   Date: 01/17/2018  MRN#  [id number] Juwon Scripter 1934/05/09  Referring Physician: Dr. Joylene Grapes Alonzo is a 84 y.o. old male seen in consultation for chief complaint of:    Chief Complaint  Patient presents with  . Advice Only    former Dr. Raul Del patient:pt states Dr. Raul Del did not tell him what was going on with his breathing.  Marland Kitchen COPD    cough with clear mucus, slight wheezing, denies chest tightness  . Shortness of Breath    with exertion  . pulmononary nodule    HPI:  He is here mostly for a complaint of cough. He has been coughing mostly at night. He is here with his wife who provides most of the hsitory, the patient is in a wheelchair and looks very fatigued.  Sometimes he has trouble swallowing and coughs. The cough has been present for about a year. They have been seeing a lot of doctors and have very poor recall of what he has been told and what procedures he has had done.  He has lost about 30 pounds in past year. He smoked about a ppd until 3 years ago. He does not drive anymore. He feels that he can walk supermarket but has poor balance, so he uses a motorized cart. He gets around his home ok, he has a wheeled walker but rarely uses it at home. He has had a fall this past year once.  He loses his balance occasionally at home occasionally but he states he usually does not fall.  He is on eliquis  for Afib.  He has no pets at home.  He takes not inhalers at home and refuses to use an inhaler because of a family member who had a bad reaction to one.   He has a history an EGD and colonoscopy august of 2017, found polyps.   Desat walk on RA at rest; 98% and HR 75, walked 50 feet, no dyspnea sat was 95% and HR 75. Very unstable gait, slow, leaned agaist wall, leg weakness and poor balance.   Imaging personally reviewed, CT chest/PET scan performed on 06/18/17. Slight right paratracheal lymphadenopathy, Left hilar lymphadenopathy, slight right hilar lymphadenopathy. The hypermetabolic left hilar mass is  anterior and superior to the distal left main stem bronchus near the entrance of the lingula.   SPIROMETRY: FVC was 2.59 liters, 93% of predicted FEV1 was 1.73, 82% of predicted FEV1 ratio was 67 FEF 25-75% liters per second was 40% of predicted   LUNG VOLUMES: TLC was 77% of predicted RV was 71% of predicted   DIFFUSION CAPACITY: DLCO was 55% of predicted DLCO/VA was 69% of predicted   FLOW VOLUME LOOP: Expiratory flow volume loop is somewhat delayed  Impression spirometry showed mild obstruction TC is slightly decreased DLCO is moderately decreased        PMHX:   Past Medical History:  Diagnosis Date  . Anemia   . Aortic aneurysm without rupture ([place]) 12/26/2015  . Arrhythmia   . Atrial fibrillation ([place])   . Benign neoplasm of cecum   . Benign neoplasm of transverse colon   . Blood in stool   . Cachexia ([place]) 05/11/2017  . CAP (community acquired pneumonia) 03/20/2016  . Chronic systolic CHF (congestive heart failure), NYHA class 3 ([place]) 12/26/2015   Overview:  Global ef 25%  . COPD (chronic obstructive pulmonary disease) ([place])   . Dysarthria due to recent stroke 03/12/2016  . Dyspnea on exertion 01/03/2016  . Erectile dysfunction   . Essential hypertension 04/14/2015  . Gastritis   . Generalized weakness 05/11/2017  . Hyperlipidemia   . Hypertension   . Left elbow pain 05/11/2017  . Lower extremity weakness (Right) 01/24/2016  . 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 Lumbar facet syndrome 08/10/2015  . Lumbar foraminal stenosis (Left Moderate-severe L4-5) (Bilateral Moderate L5-S1) 03/12/2016  . Nonrheumatic aortic valve insufficiency 01/03/2016  . Panlobular emphysema ([place]) 01/03/2016  . Persistent atrial fibrillation ([place]) 04/14/2015   Overview:  Last Assessment & Plan:  His ventricular rate continues to be elevated. Thus, I elected to increase the dose of metoprolol to 50 mg twice daily. Continue anticoagulation. He is complaining of increased dyspnea but his oxygen saturation  is 95% on room air and his lungs are relatively clear but he does have diminished breath sounds. EKG does not show any new changes. Once his heart rate i  . Pressure ulcer 03/21/2016  . Rectal polyp   . Reflux esophagitis   . Renal insufficiency   . Sacroiliac joint dysfunction 08/10/2015  . Stroke determined by clinical assessment ([place]) 03/12/2016  . Thoracic ascending aortic aneurysm ([place]) 01/11/2016   Overview:  4.8cm 12/2015  . Vitamin D deficiency   . Weakness of both legs 12/26/2015   Surgical Hx:  Past Surgical History:  Procedure Laterality Date  . COLONOSCOPY WITH PROPOFOL N/A 07/17/2016   Procedure: COLONOSCOPY WITH PROPOFOL;  Surgeon: [name], MD;  Location: [facility] ENDOSCOPY;  Service: Endoscopy;  Laterality: N/A;  . ESOPHAGOGASTRODUODENOSCOPY (EGD) WITH PROPOFOL N/A 07/17/2016   Procedure: ESOPHAGOGASTRODUODENOSCOPY (EGD) WITH PROPOFOL;  Surgeon: [name], MD;  [facility]COPY;  Service: Endoscopy;  Laterality: N/A;  . Head surgery Left    Family Hx:  Family History  Problem Relation Age of Onset  . Arthritis Father   . Bladder Cancer Neg Hx   . Kidney cancer Neg Hx   . Prolactinoma Neg Hx   . Prostate cancer Neg Hx    Social Hx:   Social History   Tobacco Use  . Smoking status: Former Smoker    Packs/day: 0.50    Years: 20.00    Pack years: 10.00    Types: Cigarettes    Last attempt to quit: 10/24/2015    Years since quitting: 2.2  . Smokeless tobacco: Never Used  Substance Use Topics  . Alcohol use: No    Alcohol/week: 0.0 oz    Comment: occasional  . Drug use: No   Medication:    Current Outpatient Medications:  .  acetaminophen (TYLENOL) 500 MG tablet, Take 1,000 mg by mouth every 6 (six) hours as needed for mild pain or headache., Disp: , Rfl:  .  apixaban (ELIQUIS) 2.5 MG TABS tablet, Take 1.25 mg by mouth 2 (two) times daily. , Disp: , Rfl:  .  metoprolol (LOPRESSOR) 50 MG tablet, Take 1 tablet (50 mg total) by mouth 2 (two) times daily.,  Disp: 60 tablet, Rfl: 2 .  albuterol (PROVENTIL HFA;VENTOLIN HFA) 108 (90 Base) MCG/ACT inhaler, Inhale 2 puffs into the lungs every 6 (six) hours as needed for wheezing or shortness of breath. (Patient not taking: Reported on 01/17/2018), Disp: 1 Inhaler, Rfl: 2 .  Avanafil (STENDRA) 200 MG TABS, Take 1 tablet by mouth daily as needed. (Patient not taking: Reported on 01/17/2018), Disp: 10 tablet, Rfl: 0 .  clotrimazole-betamethasone (LOTRISONE) cream, Reported on 06/07/2016, Disp: , Rfl: 0 .  cyclobenzaprine (FLEXERIL) 5 MG tablet, Take 1 tablet (5 mg total) by mouth 3 (three) times daily as needed for muscle spasms. (Patient not taking: Reported on 01/17/2018), Disp: 30 tablet, Rfl: 0 .  diltiazem (CARDIZEM CD) 180 MG 24 hr capsule, Take 180 mg by mouth daily., Disp: , Rfl:  .  docusate sodium (COLACE) 100 MG capsule, Take 1 capsule (100 mg total) by mouth 2 (two) times daily. (Patient not taking: Reported on 01/17/2018), Disp: 10 capsule, Rfl: 0 .  ferrous fumarate (HEMOCYTE - 106 MG FE) 325 (106 Fe) MG TABS tablet, Take 325 mg by mouth daily., Disp: , Rfl:  .  ferrous sulfate 325 (65 FE) MG tablet, Take 325 mg by mouth daily. Reported on 06/07/2016, Disp: , Rfl: 0 .  mirabegron ER (MYRBETRIQ) 50 MG TB24 tablet, Take 1 tablet (50 mg total) by mouth daily. (Patient not taking: Reported on 01/17/2018), Disp: 30 tablet, Rfl: 3 .  Multiple Vitamin (MULTI-VITAMINS) TABS, Take by mouth., Disp: , Rfl:  .  omega-3 acid ethyl esters (LOVAZA) 1 g capsule, Take 1 g by mouth daily. Reported on 06/07/2016, Disp: , Rfl:  .  pantoprazole (PROTONIX) 40 MG tablet, Reported on 06/07/2016, Disp: , Rfl: 0 .  torsemide (DEMADEX) 20 MG tablet, Take 1 tablet (20 mg total) by mouth every other day. (Patient not taking: Reported on 01/17/2018), Disp: , Rfl:    Allergies:  Aspirin and Penicillins  Review of Systems: Gen:  Denies  fever, sweats, chills HEENT: Denies blurred vision, double vision. bleeds, sore throat Cvc:  No  dizziness, chest pain. Resp:   Denies cough or sputum production, shortness of breath Gi: Denies swallowing  difficulty, stomach pain. Gu:  Denies bladder incontinence, burning urine Ext:   No Joint pain, stiffness. Skin: No skin rash,  hives  Endoc:  No polyuria, polydipsia. Psych: No depression, insomnia. Other:  All other systems were reviewed with the patient and were negative other that what is mentioned in the HPI.   Physical Examination:   VS: BP 138/82 (BP Location: Left Arm, Cuff Size: Normal)   Pulse 63   Resp 16   Ht 5\' 7"  (1.702 m)   Wt 132 lb (59.9 kg)   SpO2 100%   BMI 20.67 kg/m   General Appearance: No distress  Neuro:without focal findings,  speech normal,  HEENT: PERRLA, EOM intact.   Pulmonary: Scattered bilateral rhonchi.  No wheezing.  CardiovascularNormal S1,S2.  No m/r/g.   Abdomen: Benign, Soft, non-tender. Renal:  No costovertebral tenderness  GU:  No performed at this time. Endoc: No evident thyromegaly, no signs of acromegaly. Skin:   warm, no rashes, no ecchymosis  Extremities: normal, no cyanosis, clubbing.  Other findings:    LABORATORY PANEL:   CBC No results for input(s): WBC, HGB, HCT, PLT in the last 168 hours. ------------------------------------------------------------------------------------------------------------------  Chemistries  No results for input(s): NA, K, CL, CO2, GLUCOSE, BUN, CREATININE, CALCIUM, MG, AST, ALT, ALKPHOS, BILITOT in the last 168 hours.  Invalid input(s): GFRCGP ------------------------------------------------------------------------------------------------------------------  Cardiac Enzymes No results for input(s): TROPONINI in the last 168 hours. ------------------------------------------------------------  RADIOLOGY:  No results found.     Thank  you for the consultation and for allowing [facility] to assist in the care of your patient. Our recommendations are noted  above.  Please contact us if we can be of further service.   Marda Stalker, MD.  Board Certified in Internal Medicine, Pulmonary Medicine, [place], and Sleep Medicine.  [facility] Office Number: [phone]  Patricia Pesa, M.D.  Merton Border, M.D  01/17/2018

## 2018-01-16 NOTE — H&P (View-Only) (Signed)
[facility] Consultation      Assessment and Plan:  Bihilar lymphadenopathy, with increased PET uptake in left hilar node particularly, 30 pound weight loss in 1 year, appears suspicious for lung cancer. - Long discussion with patient and wife, I suspect that he has lung cancer, in order to treat this we would need to perform a biopsy which would involve bronchoscopy.  Given his comorbidities, and  poor functional status, I explained potential risks, which are slightly higher in him than the average person given above.  He is not certain whether he wants to proceed or not, and would like to think about it. -I asked him to call us back before his scheduled follow-up should he want to proceed with biopsy.  COPD with chronic bronchitis, and excess mucus production with chronic cough. -Patient is suspicious of starting inhalers, however he is agreeable to a trial of Spiriva. - Given prescription for Spiriva to see if this helps with his breathing and excess mucus production.  I have also recommended to him and his wife that he could try Mucinex over-the-counter which may also be helpful.  Unstable gait, debility, deconditioning. - Will refer to home PT to see if we can help improve his strength.   Meds ordered this encounter  Medications  . tiotropium (SPIRIVA) 18 MCG inhalation capsule    Sig: Place 1 capsule (18 mcg total) into inhaler and inhale daily.    Dispense:  30 capsule    Refill:  6   Return in about 6 weeks (around 02/28/2018).   Date: 01/17/2018  MRN# [id number] Merwin Breden 1934/01/17  Referring Physician: Dr. Joylene Grapes Auth is a 84 y.o. old male seen in consultation for chief complaint of:    Chief Complaint  Patient presents with  . Advice Only    former Dr. Raul Del patient:pt states Dr. Raul Del did not tell him what was going on with his breathing.  Marland Kitchen COPD    cough with clear mucus, slight wheezing, denies chest tightness  . Shortness of Breath   with exertion  . pulmononary nodule    HPI:  He is here mostly for a complaint of cough. He has been coughing mostly at night. He is here with his wife who provides most of the hsitory, the patient is in a wheelchair and looks very fatigued.  Sometimes he has trouble swallowing and coughs. The cough has been present for about a year. They have been seeing a lot of doctors and have very poor recall of what he has been told and what procedures he has had done.  He has lost about 30 pounds in past year. He smoked about a ppd until 3 years ago. He does not drive anymore. He feels that he can walk supermarket but has poor balance, so he uses a motorized cart. He gets around his home ok, he has a wheeled walker but rarely uses it at home. He has had a fall this past year once.  He loses his balance occasionally at home occasionally but he states he usually does not fall.  He is on eliquis for Afib.  He has no pets at home.  He takes not inhalers at home and refuses to use an inhaler because of a family member who had a bad reaction to one.   He has a history an EGD and colonoscopy august of 2017, found polyps.   Desat walk on RA at rest; 98% and HR 75, walked 50 feet, no  dyspnea sat was 95% and HR 75. Very unstable gait, slow, leaned agaist wall, leg weakness and poor balance.   Imaging personally reviewed, CT chest/PET scan performed on 06/18/17. Slight right paratracheal lymphadenopathy, Left hilar lymphadenopathy, slight right hilar lymphadenopathy. The hypermetabolic left hilar mass is anterior and superior to the distal left main stem bronchus near the entrance of the lingula.   SPIROMETRY: FVC was 2.59 liters, 93% of predicted FEV1 was 1.73, 82% of predicted FEV1 ratio was 67 FEF 25-75% liters per second was 40% of predicted   LUNG VOLUMES: TLC was 77% of predicted RV was 71% of predicted   DIFFUSION CAPACITY: DLCO was 55% of predicted DLCO/VA was 69% of predicted   FLOW VOLUME  LOOP: Expiratory flow volume loop is somewhat delayed  Impression spirometry showed mild obstruction TC is slightly decreased DLCO is moderately decreased        PMHX:   Past Medical History:  Diagnosis Date  . Anemia   . Aortic aneurysm without rupture ([place]) 12/26/2015  . Arrhythmia   . Atrial fibrillation ([place])   . Benign neoplasm of cecum   . Benign neoplasm of transverse colon   . Blood in stool   . Cachexia ([place]) 05/11/2017  . CAP (community acquired pneumonia) 03/20/2016  . Chronic systolic CHF (congestive heart failure), NYHA class 3 ([place]) 12/26/2015   Overview:  Global ef 25%  . COPD (chronic obstructive pulmonary disease) ([place])   . Dysarthria due to recent stroke 03/12/2016  . Dyspnea on exertion 01/03/2016  . Erectile dysfunction   . Essential hypertension 04/14/2015  . Gastritis   . Generalized weakness 05/11/2017  . Hyperlipidemia   . Hypertension   . Left elbow pain 05/11/2017  . Lower extremity weakness (Right) 01/24/2016  . 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 Lumbar facet syndrome 08/10/2015  . Lumbar foraminal stenosis (Left Moderate-severe L4-5) (Bilateral Moderate L5-S1) 03/12/2016  . Nonrheumatic aortic valve insufficiency 01/03/2016  . Panlobular emphysema ([place]) 01/03/2016  . Persistent atrial fibrillation ([place]) 04/14/2015   Overview:  Last Assessment & Plan:  His ventricular rate continues to be elevated. Thus, I elected to increase the dose of metoprolol to 50 mg twice daily. Continue anticoagulation. He is complaining of increased dyspnea but his oxygen saturation is 95% on room air and his lungs are relatively clear but he does have diminished breath sounds. EKG does not show any new changes. Once his heart rate i  . Pressure ulcer 03/21/2016  . Rectal polyp   . Reflux esophagitis   . Renal insufficiency   . Sacroiliac joint dysfunction 08/10/2015  . Stroke determined by clinical assessment ([place]) 03/12/2016  . Thoracic ascending aortic aneurysm ([place]) 01/11/2016    Overview:  4.8cm 12/2015  . Vitamin D deficiency   . Weakness of both legs 12/26/2015   Surgical Hx:  Past Surgical History:  Procedure Laterality Date  . COLONOSCOPY WITH PROPOFOL N/A 07/17/2016   Procedure: COLONOSCOPY WITH PROPOFOL;  Surgeon: [name], MD;  Location: [facility] ENDOSCOPY;  Service: Endoscopy;  Laterality: N/A;  . ESOPHAGOGASTRODUODENOSCOPY (EGD) WITH PROPOFOL N/A 07/17/2016   Procedure: ESOPHAGOGASTRODUODENOSCOPY (EGD) WITH PROPOFOL;  Surgeon: [name], MD;  Location: [facility] ENDOSCOPY;  Service: Endoscopy;  Laterality: N/A;  . Head surgery Left    Family Hx:  Family History  Problem Relation Age of Onset  . Arthritis Father   . Bladder Cancer Neg Hx   . Kidney cancer Neg Hx   . Prolactinoma Neg Hx   . Prostate cancer Neg Hx  Social Hx:   Social History   Tobacco Use  . Smoking status: Former Smoker    Packs/day: 0.50    Years: 20.00    Pack years: 10.00    Types: Cigarettes    Last attempt to quit: 10/24/2015    Years since quitting: 2.2  . Smokeless tobacco: Never Used  Substance Use Topics  . Alcohol use: No    Alcohol/week: 0.0 oz    Comment: occasional  . Drug use: No   Medication:    Current Outpatient Medications:  .  acetaminophen (TYLENOL) 500 MG tablet, Take 1,000 mg by mouth every 6 (six) hours as needed for mild pain or headache., Disp: , Rfl:  .  apixaban (ELIQUIS) 2.5 MG TABS tablet, Take 1.25 mg by mouth 2 (two) times daily. , Disp: , Rfl:  .  metoprolol (LOPRESSOR) 50 MG tablet, Take 1 tablet (50 mg total) by mouth 2 (two) times daily., Disp: 60 tablet, Rfl: 2 .  albuterol (PROVENTIL HFA;VENTOLIN HFA) 108 (90 Base) MCG/ACT inhaler, Inhale 2 puffs into the lungs every 6 (six) hours as needed for wheezing or shortness of breath. (Patient not taking: Reported on 01/17/2018), Disp: 1 Inhaler, Rfl: 2 .  Avanafil (STENDRA) 200 MG TABS, Take 1 tablet by mouth daily as needed. (Patient not taking: Reported on 01/17/2018), Disp: 10 tablet, Rfl: 0 .   clotrimazole-betamethasone (LOTRISONE) cream, Reported on 06/07/2016, Disp: , Rfl: 0 .  cyclobenzaprine (FLEXERIL) 5 MG tablet, Take 1 tablet (5 mg total) by mouth 3 (three) times daily as needed for muscle spasms. (Patient not taking: Reported on 01/17/2018), Disp: 30 tablet, Rfl: 0 .  diltiazem (CARDIZEM CD) 180 MG 24 hr capsule, Take 180 mg by mouth daily., Disp: , Rfl:  .  docusate sodium (COLACE) 100 MG capsule, Take 1 capsule (100 mg total) by mouth 2 (two) times daily. (Patient not taking: Reported on 01/17/2018), Disp: 10 capsule, Rfl: 0 .  ferrous fumarate (HEMOCYTE - 106 MG FE) 325 (106 Fe) MG TABS tablet, Take 325 mg by mouth daily., Disp: , Rfl:  .  ferrous sulfate 325 (65 FE) MG tablet, Take 325 mg by mouth daily. Reported on 06/07/2016, Disp: , Rfl: 0 .  mirabegron ER (MYRBETRIQ) 50 MG TB24 tablet, Take 1 tablet (50 mg total) by mouth daily. (Patient not taking: Reported on 01/17/2018), Disp: 30 tablet, Rfl: 3 .  Multiple Vitamin (MULTI-VITAMINS) TABS, Take by mouth., Disp: , Rfl:  .  omega-3 acid ethyl esters (LOVAZA) 1 g capsule, Take 1 g by mouth daily. Reported on 06/07/2016, Disp: , Rfl:  .  pantoprazole (PROTONIX) 40 MG tablet, Reported on 06/07/2016, Disp: , Rfl: 0 .  torsemide (DEMADEX) 20 MG tablet, Take 1 tablet (20 mg total) by mouth every other day. (Patient not taking: Reported on 01/17/2018), Disp: , Rfl:    Allergies:  Aspirin and Penicillins  Review of Systems: Gen:  Denies  fever, sweats, chills HEENT: Denies blurred vision, double vision. bleeds, sore throat Cvc:  No dizziness, chest pain. Resp:   Denies cough or sputum production, shortness of breath Gi: Denies swallowing difficulty, stomach pain. Gu:  Denies bladder incontinence, burning urine Ext:   No Joint pain, stiffness. Skin: No skin rash,  hives  Endoc:  No polyuria, polydipsia. Psych: No depression, insomnia. Other:  All other systems were reviewed with the patient and were negative other that what is  mentioned in the HPI.   Physical Examination:   VS: BP 138/82 (BP Location: Left Arm,  Cuff Size: Normal)   Pulse 63   Resp 16   Ht 5\' 7"  (1.702 m)   Wt 132 lb (59.9 kg)   SpO2 100%   BMI 20.67 kg/m   General Appearance: No distress  Neuro:without focal findings,  speech normal,  HEENT: PERRLA, EOM intact.   Pulmonary: Scattered bilateral rhonchi.  No wheezing.  CardiovascularNormal S1,S2.  No m/r/g.   Abdomen: Benign, Soft, non-tender. Renal:  No costovertebral tenderness  GU:  No performed at this time. Endoc: No evident thyromegaly, no signs of acromegaly. Skin:   warm, no rashes, no ecchymosis  Extremities: normal, no cyanosis, clubbing.  Other findings:    LABORATORY PANEL:   CBC No results for input(s): WBC, HGB, HCT, PLT in the last 168 hours. ------------------------------------------------------------------------------------------------------------------  Chemistries  No results for input(s): NA, K, CL, CO2, GLUCOSE, BUN, CREATININE, CALCIUM, MG, AST, ALT, ALKPHOS, BILITOT in the last 168 hours.  Invalid input(s): GFRCGP ------------------------------------------------------------------------------------------------------------------  Cardiac Enzymes No results for input(s): TROPONINI in the last 168 hours. ------------------------------------------------------------  RADIOLOGY:  No results found.     Thank  you for the consultation and for allowing [facility] to assist in the care of your patient. Our recommendations are noted above.  Please contact us if we can be of further service.   Marda Stalker, MD.  Board Certified in Internal Medicine, Pulmonary Medicine, [place], and Sleep Medicine.  [facility] Office Number: [phone]  Patricia Pesa, M.D.  Merton Border, M.D  01/17/2018

## 2018-01-17 ENCOUNTER — Ambulatory Visit (INDEPENDENT_AMBULATORY_CARE_PROVIDER_SITE_OTHER): Payer: Medicare Other | Admitting: Internal Medicine

## 2018-01-17 ENCOUNTER — Encounter: Payer: Self-pay | Admitting: Internal Medicine

## 2018-01-17 VITALS — BP 138/82 | HR 63 | Resp 16 | Ht 67.0 in | Wt 132.0 lb

## 2018-01-17 DIAGNOSIS — R5381 Other malaise: Secondary | ICD-10-CM

## 2018-01-17 DIAGNOSIS — J449 Chronic obstructive pulmonary disease, unspecified: Secondary | ICD-10-CM | POA: Diagnosis not present

## 2018-01-17 DIAGNOSIS — J4489 Other specified chronic obstructive pulmonary disease: Secondary | ICD-10-CM

## 2018-01-17 DIAGNOSIS — R911 Solitary pulmonary nodule: Secondary | ICD-10-CM

## 2018-01-17 DIAGNOSIS — R269 Unspecified abnormalities of gait and mobility: Secondary | ICD-10-CM

## 2018-01-17 MED ORDER — TIOTROPIUM BROMIDE MONOHYDRATE 18 MCG IN CAPS
18.0000 ug | ORAL_CAPSULE | Freq: Every day | RESPIRATORY_TRACT | 6 refills | Status: DC
Start: 2018-01-17 — End: 2018-10-27

## 2018-01-17 NOTE — Addendum Note (Signed)
Addended by: Stephanie Coup on: 01/17/2018 10:13 AM   Modules accepted: Orders

## 2018-01-17 NOTE — Patient Instructions (Addendum)
You have a nodule in your lung, which is likely lung cancer.  You want treatment of this it would need to be biopsied with a bronchoscopy.   Risks from bronchoscopy include bleeding, infection, lung collapse and death. These risks are low but are higher in you than most people because of your age, medical problems, weakness.   You can take mucinex over the counter for your cough and the mucus in your lungs.   Call us back if you would like to proceed with the bronchoscopy procedure.

## 2018-01-20 ENCOUNTER — Telehealth: Payer: Self-pay | Admitting: Internal Medicine

## 2018-01-20 ENCOUNTER — Other Ambulatory Visit: Payer: Self-pay | Admitting: Internal Medicine

## 2018-01-20 DIAGNOSIS — R590 Localized enlarged lymph nodes: Secondary | ICD-10-CM

## 2018-01-20 NOTE — Telephone Encounter (Signed)
Placed order for EBUS bronchoscopy, no fluoro.

## 2018-01-20 NOTE — Telephone Encounter (Signed)
Orders placed. Once confirmed with pre-assessment patient will be contacted. Nothing further needed.

## 2018-01-20 NOTE — Telephone Encounter (Signed)
Pt spouse calling stating pt is wanting to do the procedure (Endo)   He is wanting to schedule this  Please call back

## 2018-01-21 ENCOUNTER — Telehealth: Payer: Self-pay | Admitting: *Deleted

## 2018-01-21 NOTE — Telephone Encounter (Signed)
Pt spouse aware of pre-admission 01/23/18 at 9:15. Pt spouse aware of bronch arriva 12 noon. NPO after midnight. Last dose of Eliquis will be Saturday. Must have driver stay during procedure. She states she has appt that day. She states he will have other family members (daughters,sisters) present.

## 2018-01-22 ENCOUNTER — Telehealth: Payer: Self-pay | Admitting: Internal Medicine

## 2018-01-22 NOTE — Telephone Encounter (Signed)
[facility]hers from [facility] calling asking to get a verbal approval for [employer].    They would go out 3x a month for two months   Please call back

## 2018-01-22 NOTE — Telephone Encounter (Signed)
Verbal given per Dr. Ashby Dawes 3x for 2 mos. Reason possible finding of cancer. Nothing further needed.

## 2018-01-23 ENCOUNTER — Other Ambulatory Visit: Payer: Self-pay

## 2018-01-23 ENCOUNTER — Encounter
Admission: RE | Admit: 2018-01-23 | Discharge: 2018-01-23 | Disposition: A | Discharge status: Home / Self Care | Payer: Medicare Other | Source: Ambulatory Visit | Attending: Internal Medicine | Admitting: Internal Medicine

## 2018-01-23 DIAGNOSIS — R9431 Abnormal electrocardiogram [ECG] [EKG]: Secondary | ICD-10-CM | POA: Diagnosis not present

## 2018-01-23 DIAGNOSIS — I4891 Unspecified atrial fibrillation: Secondary | ICD-10-CM | POA: Insufficient documentation

## 2018-01-23 HISTORY — DX: Cardiac arrhythmia, unspecified: I49.9

## 2018-01-23 NOTE — Patient Instructions (Signed)
Your procedure is scheduled on: Mon. 01/27/18 Report to Day Surgery. To find out your arrival time please call [phone] between 1PM - 3PM on Friday 01/24/18.  Remember: Instructions that are not followed completely may result in serious medical risk, up to and including death, or upon the discretion of your surgeon and anesthesiologist your surgery may need to be rescheduled.     _X__ 1. Do not eat food after midnight the night before your procedure.                 No gum chewing or hard candies. You may drink clear liquids up to 2 hours                 before you are scheduled to arrive for your surgery- DO not drink clear                 liquids within 2 hours of the start of your surgery.                 Clear Liquids include:  water, apple juice without pulp, clear carbohydrate                 drink such as Clearfast of Gartorade, Black Coffee or Tea (Do not add                 anything to coffee or tea).  __X__2.  On the morning of surgery brush your teeth with toothpaste and water, you may rinse your mouth with mouthwash if you wish.  Do not swallow any              toothpaste of mouthwash.     ___ 3.  No Alcohol for 24 hours before or after surgery.   ___ 4.  Do Not Smoke or use e-cigarettes For 24 Hours Prior to Your Surgery.                 Do not use any chewable tobacco products for at least 6 hours prior to                 surgery.  ____  5.  Bring all medications with you on the day of surgery if instructed.   ___x_  6.  Notify your doctor if there is any change in your medical condition      (cold, fever, infections).     Do not wear jewelry, make-up, hairpins, clips or nail polish. Do not wear lotions, powders, or perfumes. You may wear deodorant. Do not shave 48 hours prior to surgery. Men may shave face and neck. Do not bring valuables to the hospital.    [facility] is not responsible for any belongings or valuables.  Contacts, dentures  or bridgework may not be worn into surgery. Leave your suitcase in the car. After surgery it may be brought to your room. For patients admitted to the hospital, discharge time is determined by your treatment team.   Patients discharged the day of surgery will not be allowed to drive home.   Please read over the following fact sheets that you were given:    ___x_ Take these medicines the morning of surgery with A SIP OF WATER:    1. acetaminophen (TYLENOL) 500 MG tablet  2. diltiazem (CARDIZEM CD) 180 MG 24 hr capsule  3. metoprolol (LOPRESSOR) 50 MG tablet  4.tiotropium (SPIRIVA) 18 MCG inhalation capsule  5.  6.  ____ Fleet Enema (as directed)  ____ Use CHG Soap as directed  __x__ Use inhalers on the day of surgery  ____ Stop metformin 2 days prior to surgery    ____ Take 1/2 of usual insulin dose the night before surgery. No insulin the morning          of surgery.   _x___ Stop Eloquist on 2/15 ____ Stop Anti-inflammatories on    ____ Stop supplements until after surgery.    ____ Bring C-Pap to the hospital.

## 2018-01-27 ENCOUNTER — Ambulatory Visit
Admission: RE | Admit: 2018-01-27 | Discharge: 2018-01-27 | Disposition: A | Discharge status: Home / Self Care | Payer: Medicare Other | Source: Ambulatory Visit | Attending: Internal Medicine | Admitting: Internal Medicine

## 2018-01-27 ENCOUNTER — Encounter
Admission: RE | Disposition: A | Discharge status: Home / Self Care | Payer: Self-pay | Source: Ambulatory Visit | Attending: Internal Medicine

## 2018-01-27 ENCOUNTER — Other Ambulatory Visit: Payer: Self-pay

## 2018-01-27 ENCOUNTER — Ambulatory Visit: Payer: Medicare Other | Admitting: Certified Registered"

## 2018-01-27 ENCOUNTER — Encounter: Payer: Self-pay | Admitting: *Deleted

## 2018-01-27 ENCOUNTER — Ambulatory Visit: Payer: Medicare Other

## 2018-01-27 DIAGNOSIS — I351 Nonrheumatic aortic (valve) insufficiency: Secondary | ICD-10-CM | POA: Diagnosis not present

## 2018-01-27 DIAGNOSIS — R2681 Unsteadiness on feet: Secondary | ICD-10-CM | POA: Diagnosis not present

## 2018-01-27 DIAGNOSIS — M48061 Spinal stenosis, lumbar region without neurogenic claudication: Secondary | ICD-10-CM | POA: Insufficient documentation

## 2018-01-27 DIAGNOSIS — I5022 Chronic systolic (congestive) heart failure: Secondary | ICD-10-CM | POA: Diagnosis not present

## 2018-01-27 DIAGNOSIS — Z8261 Family history of arthritis: Secondary | ICD-10-CM | POA: Insufficient documentation

## 2018-01-27 DIAGNOSIS — I110 Hypertensive heart disease with heart failure: Secondary | ICD-10-CM | POA: Diagnosis not present

## 2018-01-27 DIAGNOSIS — I712 Thoracic aortic aneurysm, without rupture: Secondary | ICD-10-CM | POA: Insufficient documentation

## 2018-01-27 DIAGNOSIS — J449 Chronic obstructive pulmonary disease, unspecified: Secondary | ICD-10-CM | POA: Diagnosis not present

## 2018-01-27 DIAGNOSIS — R531 Weakness: Secondary | ICD-10-CM | POA: Insufficient documentation

## 2018-01-27 DIAGNOSIS — E559 Vitamin D deficiency, unspecified: Secondary | ICD-10-CM | POA: Insufficient documentation

## 2018-01-27 DIAGNOSIS — E785 Hyperlipidemia, unspecified: Secondary | ICD-10-CM | POA: Insufficient documentation

## 2018-01-27 DIAGNOSIS — I481 Persistent atrial fibrillation: Secondary | ICD-10-CM | POA: Diagnosis not present

## 2018-01-27 DIAGNOSIS — Z86010 Personal history of colonic polyps: Secondary | ICD-10-CM | POA: Insufficient documentation

## 2018-01-27 DIAGNOSIS — R590 Localized enlarged lymph nodes: Secondary | ICD-10-CM | POA: Insufficient documentation

## 2018-01-27 DIAGNOSIS — Z79899 Other long term (current) drug therapy: Secondary | ICD-10-CM | POA: Insufficient documentation

## 2018-01-27 DIAGNOSIS — E860 Dehydration: Secondary | ICD-10-CM | POA: Insufficient documentation

## 2018-01-27 DIAGNOSIS — Z7901 Long term (current) use of anticoagulants: Secondary | ICD-10-CM | POA: Diagnosis not present

## 2018-01-27 DIAGNOSIS — Z886 Allergy status to analgesic agent status: Secondary | ICD-10-CM | POA: Insufficient documentation

## 2018-01-27 DIAGNOSIS — Z7951 Long term (current) use of inhaled steroids: Secondary | ICD-10-CM | POA: Insufficient documentation

## 2018-01-27 DIAGNOSIS — I700 Atherosclerosis of aorta: Secondary | ICD-10-CM | POA: Diagnosis not present

## 2018-01-27 DIAGNOSIS — Z87891 Personal history of nicotine dependence: Secondary | ICD-10-CM | POA: Diagnosis not present

## 2018-01-27 DIAGNOSIS — Z9181 History of falling: Secondary | ICD-10-CM | POA: Insufficient documentation

## 2018-01-27 DIAGNOSIS — I499 Cardiac arrhythmia, unspecified: Secondary | ICD-10-CM | POA: Insufficient documentation

## 2018-01-27 DIAGNOSIS — K210 Gastro-esophageal reflux disease with esophagitis: Secondary | ICD-10-CM | POA: Insufficient documentation

## 2018-01-27 DIAGNOSIS — R911 Solitary pulmonary nodule: Secondary | ICD-10-CM | POA: Diagnosis not present

## 2018-01-27 DIAGNOSIS — D649 Anemia, unspecified: Secondary | ICD-10-CM | POA: Insufficient documentation

## 2018-01-27 DIAGNOSIS — R1310 Dysphagia, unspecified: Secondary | ICD-10-CM | POA: Diagnosis not present

## 2018-01-27 DIAGNOSIS — Z8673 Personal history of transient ischemic attack (TIA), and cerebral infarction without residual deficits: Secondary | ICD-10-CM | POA: Diagnosis not present

## 2018-01-27 DIAGNOSIS — R634 Abnormal weight loss: Secondary | ICD-10-CM | POA: Insufficient documentation

## 2018-01-27 DIAGNOSIS — Z9889 Other specified postprocedural states: Secondary | ICD-10-CM

## 2018-01-27 DIAGNOSIS — Z880 Allergy status to penicillin: Secondary | ICD-10-CM | POA: Insufficient documentation

## 2018-01-27 HISTORY — PX: ENDOBRONCHIAL ULTRASOUND: SHX5096

## 2018-01-27 SURGERY — ENDOBRONCHIAL ULTRASOUND (EBUS)
Anesthesia: General

## 2018-01-27 MED ORDER — FAMOTIDINE 20 MG PO TABS
20.0000 mg | ORAL_TABLET | Freq: Once | ORAL | Status: AC
  Administered 2018-01-27: 20 mg via ORAL

## 2018-01-27 MED ORDER — LACTATED RINGERS IV SOLN
INTRAVENOUS | Status: DC
  Administered 2018-01-27: 12:00:00 via INTRAVENOUS

## 2018-01-27 MED ORDER — ONDANSETRON HCL 4 MG/2ML IJ SOLN
INTRAMUSCULAR | Status: AC
  Filled 2018-01-27: qty 2

## 2018-01-27 MED ORDER — FENTANYL CITRATE (PF) 100 MCG/2ML IJ SOLN: 25.0000 ug | INTRAMUSCULAR | Status: DC | PRN

## 2018-01-27 MED ORDER — SUCCINYLCHOLINE CHLORIDE 20 MG/ML IJ SOLN
INTRAMUSCULAR | Status: AC
  Filled 2018-01-27: qty 1

## 2018-01-27 MED ORDER — PHENYLEPHRINE HCL 10 MG/ML IJ SOLN
INTRAMUSCULAR | Status: DC | PRN
  Administered 2018-01-27: 100 ug via INTRAVENOUS

## 2018-01-27 MED ORDER — MEPERIDINE HCL 50 MG/ML IJ SOLN: 6.2500 mg | INTRAMUSCULAR | Status: DC | PRN

## 2018-01-27 MED ORDER — EPHEDRINE SULFATE 50 MG/ML IJ SOLN
INTRAMUSCULAR | Status: AC
  Filled 2018-01-27: qty 1

## 2018-01-27 MED ORDER — OXYCODONE HCL 5 MG/5ML PO SOLN: 5.0000 mg | Freq: Once | ORAL | Status: DC | PRN

## 2018-01-27 MED ORDER — PROPOFOL 500 MG/50ML IV EMUL
INTRAVENOUS | Status: AC
  Filled 2018-01-27: qty 50

## 2018-01-27 MED ORDER — PROMETHAZINE HCL 25 MG/ML IJ SOLN: 6.2500 mg | INTRAMUSCULAR | Status: DC | PRN

## 2018-01-27 MED ORDER — SUCCINYLCHOLINE CHLORIDE 20 MG/ML IJ SOLN
INTRAMUSCULAR | Status: DC | PRN
  Administered 2018-01-27: 100 mg via INTRAVENOUS

## 2018-01-27 MED ORDER — BUTAMBEN-TETRACAINE-BENZOCAINE 2-2-14 % EX AERO
1.0000 | INHALATION_SPRAY | Freq: Once | CUTANEOUS | Status: DC
  Filled 2018-01-27: qty 20

## 2018-01-27 MED ORDER — PROPOFOL 10 MG/ML IV BOLUS
INTRAVENOUS | Status: DC | PRN
  Administered 2018-01-27: 50 mg via INTRAVENOUS
  Administered 2018-01-27: 30 mg via INTRAVENOUS
  Administered 2018-01-27: 100 mg via INTRAVENOUS

## 2018-01-27 MED ORDER — LIDOCAINE HCL (PF) 1 % IJ SOLN
INTRAMUSCULAR | Status: AC
  Filled 2018-01-27: qty 5

## 2018-01-27 MED ORDER — PHENYLEPHRINE HCL 0.25 % NA SOLN
1.0000 | Freq: Four times a day (QID) | NASAL | Status: DC | PRN
  Filled 2018-01-27: qty 15

## 2018-01-27 MED ORDER — PHENYLEPHRINE HCL 10 MG/ML IJ SOLN
INTRAMUSCULAR | Status: AC
  Filled 2018-01-27: qty 1

## 2018-01-27 MED ORDER — LIDOCAINE HCL 2 % EX GEL
1.0000 "application " | Freq: Once | CUTANEOUS | Status: DC
  Filled 2018-01-27: qty 5

## 2018-01-27 MED ORDER — FENTANYL CITRATE (PF) 100 MCG/2ML IJ SOLN
INTRAMUSCULAR | Status: DC | PRN
  Administered 2018-01-27 (×2): 50 ug via INTRAVENOUS

## 2018-01-27 MED ORDER — OXYCODONE HCL 5 MG PO TABS: 5.0000 mg | ORAL_TABLET | Freq: Once | ORAL | Status: DC | PRN

## 2018-01-27 MED ORDER — ONDANSETRON HCL 4 MG/2ML IJ SOLN
INTRAMUSCULAR | Status: DC | PRN
  Administered 2018-01-27: 4 mg via INTRAVENOUS

## 2018-01-27 MED ORDER — PROPOFOL 500 MG/50ML IV EMUL
INTRAVENOUS | Status: DC | PRN
  Administered 2018-01-27: 100 ug/kg/min via INTRAVENOUS

## 2018-01-27 MED ORDER — LIDOCAINE HCL (CARDIAC) 20 MG/ML IV SOLN
INTRAVENOUS | Status: DC | PRN
  Administered 2018-01-27: 50 mg via INTRAVENOUS

## 2018-01-27 MED ORDER — FAMOTIDINE 20 MG PO TABS
ORAL_TABLET | ORAL | Status: AC
  Administered 2018-01-27: 20 mg via ORAL
  Filled 2018-01-27: qty 1

## 2018-01-27 MED ORDER — FENTANYL CITRATE (PF) 100 MCG/2ML IJ SOLN
INTRAMUSCULAR | Status: AC
  Filled 2018-01-27: qty 2

## 2018-01-27 NOTE — Anesthesia Post-op Follow-up Note (Signed)
Anesthesia QCDR form completed.        

## 2018-01-27 NOTE — OR Nursing (Signed)
Discharge instructions given to pt and family. All voice understanding

## 2018-01-27 NOTE — OR Nursing (Signed)
Patient would like to talk with Dr. Felicie Morn.  Doctor called.  He said he spoke with family and is now in clinic and can not come right now.  Patient irritated.

## 2018-01-27 NOTE — Interval H&P Note (Signed)
History and Physical Interval Note:  01/27/2018 12:08 PM  Patrick Harding  has presented today for surgery, with the diagnosis of LUNG MASS  The various methods of treatment have been discussed with the patient and family. After consideration of risks, benefits and other options for treatment, the patient has consented to  Procedure(s): ENDOBRONCHIAL ULTRASOUND (N/A) as a surgical intervention .  The patient's history has been reviewed, patient examined, no change in status, stable for surgery.  I have reviewed the patient's chart and labs.  Questions were answered to the patient's satisfaction.     [name]

## 2018-01-27 NOTE — Transfer of Care (Signed)
Immediate Anesthesia Transfer of Care Note  Patient: Patrick Harding  Procedure(s) Performed: ENDOBRONCHIAL ULTRASOUND (N/A )  Patient Location: PACU  Anesthesia Type:General  Level of Consciousness: drowsy  Airway & Oxygen Therapy: Patient Spontanous Breathing and Patient connected to face mask oxygen  Post-op Assessment: Report given to RN, Post -op Vital signs reviewed and stable and Patient moving all extremities  Post vital signs: Reviewed and stable  Last Vitals:  Vitals:   01/27/18 1155 01/27/18 1418  BP: 140/63 102/71  Pulse: (!) 58 (!) 45  Resp: 16 20  Temp: (!) 36.3 C (!) 35.9 C  SpO2: 98% 100%    Last Pain:  Vitals:   01/27/18 1155  TempSrc: Tympanic         Complications: No apparent anesthesia complications

## 2018-01-27 NOTE — Anesthesia Postprocedure Evaluation (Signed)
Anesthesia Post Note  Patient: Margaretmary Dys  Procedure(s) Performed: ENDOBRONCHIAL ULTRASOUND (N/A )  Patient location during evaluation: PACU Anesthesia Type: General Level of consciousness: awake and alert and oriented Pain management: pain level controlled Vital Signs Assessment: post-procedure vital signs reviewed and stable Respiratory status: spontaneous breathing, nonlabored ventilation and respiratory function stable Cardiovascular status: blood pressure returned to baseline and stable Postop Assessment: no signs of nausea or vomiting Anesthetic complications: no     Last Vitals:  Vitals:   01/27/18 1520 01/27/18 1527  BP: (!) 93/47 (!) 99/53  Pulse: 89 (!) 45  Resp: 16 17  Temp:  (!) 36.3 C  SpO2: 97% 99%    Last Pain:  Vitals:   01/27/18 1527  TempSrc: Temporal                 Rafay Dahan

## 2018-01-27 NOTE — Procedures (Signed)
  [facility]            Bronchoscopy Note   FINDINGS/SUMMARY:  -Normal airways without evidence of endobronchial lesions. -EBUS guided needle biopsies taken at several lymph node stations starting sequentially from the right hilar, right paratracheal, 10 L lymph node station, and left hilar lymph node/mass. -Left upper lobe transbronchial cytology brushing, washing/BAL, removal of mucous plugs from both lungs.  Indication: Mediastinal lymphadenopathy. The patient (or their representative) was informed of the risks (including but not limited to bleeding, infection, respiratory failure, lung injury, tooth/oral injury) and benefits of the procedure and gave consent, see chart.   Pre-op diagnosis: Mediastinal lymphadenopathy Post-op diagnosis: Same Estimated blood loss: 10 mL's  Medications for procedure: Please see anesthesia notes.  Procedure description: After obtaining informed consent, timeout was called to confirm the patient and the procedure.  The patient was intubated by anesthesia services please see their notes for further details.  The bronchoscope was passed by the endotracheal tube to the right hilar station, moderate lymphadenopathy was seen.  EBUS guided lymph node sampling was taken from the right hilar station, subsequently from the right paratracheal lymph node station.  The balloon was placed up and 10 L lymph node station was identified, the aspiration was performed here.  The bronchoscope was then taken to the distal left mainstem, where anteriorly lymph node mass was seen at the area of the left hilar node station, samplings taken from the station with good returns. The [place] scope was then removed, the white light bronchoscope was then passed, an anatomical tumor was undertaken, all segments were visualized no endobronchial lesions were noted.  There was copious mucosal secretions were seen throughout both lungs which were suctioned.  Bronchoscope was then  wedged in the left upper lobe, transbronchial cytology brushing was taken in the multiple sub-segments of the left upper lobe, BAL was performed and sent for cytology and microbiology.  As adequate samples had been obtained, the bronchoscope was removed.    Condition post procedure: Stable   Complications: None noted     Deep Ashby Dawes, MD.  Board Certified in Internal Medicine, Pulmonary Medicine, [place], and Sleep Medicine.  [facility] Office Number: [phone]  Patricia Pesa, M.D.  Cheral Marker, M.D  01/27/2018

## 2018-01-27 NOTE — Anesthesia Procedure Notes (Deleted)
Performed by: Rolla Plate, CRNA

## 2018-01-27 NOTE — Anesthesia Preprocedure Evaluation (Signed)
Anesthesia Evaluation  Patient identified by MRN, date of birth, ID band Patient awake    Reviewed: Allergy & Precautions, NPO status , Patient's Chart, lab work & pertinent test results  History of Anesthesia Complications Negative for: history of anesthetic complications  Airway Mallampati: III  TM Distance: >3 FB Neck ROM: Full    Dental  (+) Edentulous Upper, Poor Dentition   Pulmonary neg sleep apnea, COPD, former smoker,    breath sounds clear to auscultation- rhonchi (-) wheezing      Cardiovascular hypertension, Pt. on medications +CHF (EF 30%)  (-) CAD, (-) Past MI, (-) Cardiac Stents and (-) CABG + dysrhythmias Atrial Fibrillation  Rhythm:Regular Rate:Normal - Systolic murmurs and - Diastolic murmurs NM stress test 01/11/16: No ischemia  Echo 12/08/15: SEVERE LV DYSFUNCTION (EF 30%) WITH MILD LVH NORMAL RIGHT VENTRICULAR SYSTOLIC FUNCTION VALVULAR REGURGITATION: MILD AR, MILD MR, MILD PR, MODERATE TR NO VALVULAR STENOSIS SEVERE LV DYSFUNCTION MILD TO MODERATE AR   Neuro/Psych CVA negative psych ROS   GI/Hepatic negative GI ROS, Neg liver ROS,   Endo/Other  negative endocrine ROSneg diabetes  Renal/GU Renal InsufficiencyRenal disease     Musculoskeletal negative musculoskeletal ROS (+)   Abdominal (+) - obese,   Peds  Hematology  (+) anemia ,   Anesthesia Other Findings Past Medical History: No date: Anemia 12/26/2015: Aortic aneurysm without rupture (HCC) No date: Arrhythmia No date: Atrial fibrillation (HCC) No date: Benign neoplasm of cecum No date: Benign neoplasm of transverse colon No date: Blood in stool 05/11/2017: Cachexia ([place]) 03/20/2016: CAP (community acquired pneumonia) 07/19/1750: Chronic systolic CHF (congestive heart failure), NYHA  class 3 ([place])     Comment:  Overview:  Global ef 25% No date: COPD (chronic obstructive pulmonary disease) ([place]) 03/12/2016: Dysarthria due to  recent stroke 01/03/2016: Dyspnea on exertion No date: Dysrhythmia     Comment:  atrial fib. 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 Thus, I elected to increase the              dose of metoprolol to 50 mg twice daily. Continue               anticoagulation. He is complaining of increased dyspnea               but his oxygen saturation is 95% on room air and his               lungs are relatively clear but he does have diminished               breath sounds. EKG does not show any new changes. Once               his heart rate i 03/21/2016: Pressure ulcer No date: Rectal polyp No date: Reflux esophagitis No date: Renal insufficiency 08/10/2015: Sacroiliac joint dysfunction 03/12/2016: Stroke determined by clinical assessment ([place]) 01/11/2016: Thoracic ascending aortic aneurysm ([place])     Comment:  Overview:  4.8cm 12/2015 No date: Vitamin  D deficiency 12/26/2015: Weakness of both legs   Reproductive/Obstetrics                             Anesthesia Physical Anesthesia Plan  ASA: III  Anesthesia Plan: General   Post-op Pain Management:    Induction: Intravenous  PONV Risk Score and Plan: 1 and Ondansetron  Airway Management Planned: Oral ETT  Additional Equipment:   Intra-op Plan:   Post-operative Plan: Extubation in OR  Informed Consent: I have reviewed the  patients History and Physical, chart, labs and discussed the procedure including the risks, benefits and alternatives for the proposed anesthesia with the patient or authorized representative who has indicated his/her understanding and acceptance.   Dental advisory given  Plan Discussed with: CRNA and Anesthesiologist  Anesthesia Plan Comments:         Anesthesia Quick Evaluation

## 2018-01-27 NOTE — Anesthesia Procedure Notes (Addendum)
Procedure Name: Intubation Date/Time: 01/27/2018 1:13 PM Performed by: Rolla Plate, CRNA Pre-anesthesia Checklist: Patient identified, Patient being monitored, Timeout performed, Emergency Drugs available and Suction available Patient Re-evaluated:Patient Re-evaluated prior to induction Oxygen Delivery Method: Circle system utilized Preoxygenation: Pre-oxygenation with 100% oxygen Induction Type: IV induction Ventilation: Mask ventilation without difficulty Laryngoscope Size: Miller and 2 Grade View: Grade I Tube type: Oral Tube size: 8.0 mm Number of attempts: 1 Airway Equipment and Method: Stylet Placement Confirmation: ETT inserted through vocal cords under direct vision,  positive ETCO2 and breath sounds checked- equal and bilateral Secured at: 23 cm Tube secured with: Tape Dental Injury: Teeth and Oropharynx as per pre-operative assessment

## 2018-01-28 ENCOUNTER — Encounter: Payer: Self-pay | Admitting: Internal Medicine

## 2018-01-28 ENCOUNTER — Ambulatory Visit: Payer: Medicare Other | Admitting: General Surgery

## 2018-01-29 ENCOUNTER — Encounter: Payer: Self-pay | Admitting: *Deleted

## 2018-01-29 LAB — CULTURE, BAL-QUANTITATIVE
CULTURE: NORMAL — AB
SPECIAL REQUESTS: NORMAL

## 2018-01-29 LAB — ACID FAST SMEAR (AFB): ACID FAST SMEAR - AFSCU2: NEGATIVE

## 2018-01-29 LAB — CYTOLOGY - NON PAP

## 2018-01-29 LAB — CULTURE, BAL-QUANTITATIVE W GRAM STAIN

## 2018-01-29 LAB — ACID FAST SMEAR (AFB, MYCOBACTERIA)

## 2018-01-31 ENCOUNTER — Telehealth: Payer: Self-pay | Admitting: Internal Medicine

## 2018-01-31 NOTE — Telephone Encounter (Signed)
Pt daughter calling asking about Biopsy results  Please call back

## 2018-02-03 NOTE — Telephone Encounter (Signed)
Explained negative results to wife. Ordered further blood work. Please add pt to tumor board list for Thursday. Will update them with plan after discussion.

## 2018-02-03 NOTE — Addendum Note (Signed)
Addended by: [name] on: 02/03/2018 03:34 PM   Modules accepted: Orders

## 2018-02-04 ENCOUNTER — Other Ambulatory Visit: Payer: Self-pay | Admitting: *Deleted

## 2018-02-04 ENCOUNTER — Telehealth: Payer: Self-pay | Admitting: Internal Medicine

## 2018-02-04 DIAGNOSIS — R918 Other nonspecific abnormal finding of lung field: Secondary | ICD-10-CM

## 2018-02-04 LAB — VIRUS CULTURE

## 2018-02-04 NOTE — Telephone Encounter (Signed)
Elder Cyphers with [facility] calling requesting an order for a nurse to go out and help patient with medications He is not taking his inhaler, for patient is scared of it, per patient wife He is not taking the correct dosage, per patient wife she is giving him her own medication. She is giving him her metoprolol   Would like advise on this Please call back

## 2018-02-04 NOTE — Telephone Encounter (Signed)
Order placed for tumor board

## 2018-02-05 ENCOUNTER — Telehealth: Payer: Self-pay | Admitting: *Deleted

## 2018-02-05 ENCOUNTER — Other Ambulatory Visit: Payer: Self-pay | Admitting: Internal Medicine

## 2018-02-05 DIAGNOSIS — J439 Emphysema, unspecified: Secondary | ICD-10-CM

## 2018-02-05 MED ORDER — IPRATROPIUM-ALBUTEROL 0.5-2.5 (3) MG/3ML IN SOLN: 3.0000 mL | Freq: Three times a day (TID) | RESPIRATORY_TRACT | 11 refills | Status: AC

## 2018-02-05 MED ORDER — AMBULATORY NON FORMULARY MEDICATION: 0 refills | Status: DC

## 2018-02-05 NOTE — Telephone Encounter (Signed)
After speaking with Dr. Ashby Dawes this referral would need to come thru patient's pcp who is Dr Rosario Jacks. Jeani Hawking with home health states she has called Dr. Guerry Bruin office twice and has yet to here back from them. She states will just have patient make an appt. Nothing further needed.

## 2018-02-05 NOTE — Telephone Encounter (Signed)
  Dr. Ashby Dawes has returned call to Dr. Francesca Oman office and left vmail to call back with questions:  Called spouse re: Metoprolol she states there was no confusion. She is on Metoprolol 25 mg and patient is on 50mg s. He has been out of medication so she have him some of her medication.   Dr. Juanell Fairly prescribed Spiriva Resp 18 mcg. Pt is afraid of inhaler because he had a niece to die from inhaler over use. Patient's wife would like inhaler changed.  Spoke with Dr. Juanell Fairly and he says d/c Spiriva and send Duoneb to use 3 times per day. Also send order for nebulizer.

## 2018-02-05 NOTE — Telephone Encounter (Signed)
Ok

## 2018-02-05 NOTE — Progress Notes (Signed)
nebulizer and medication ordered.

## 2018-02-06 ENCOUNTER — Telehealth: Payer: Self-pay | Admitting: Internal Medicine

## 2018-02-06 NOTE — Addendum Note (Signed)
Addended by: [name] on: 02/06/2018 03:05 PM   Modules accepted: Orders

## 2018-02-06 NOTE — Telephone Encounter (Signed)
Patient returning call.

## 2018-02-06 NOTE — Telephone Encounter (Signed)
Spoke with daughter and informed her that DR was changing the inhaler to nebs. Verbalized understanding

## 2018-02-19 ENCOUNTER — Telehealth: Payer: Self-pay | Admitting: Internal Medicine

## 2018-02-19 NOTE — Telephone Encounter (Signed)
Pt and wife informed that Davy Pique spoke with the daughter on 02/06/18 and informed her that the neb medication has been sent to [facility] on [address]. Wife states [facility] had not informed them and that she will pick it up. They have received the neb machine. Nothing further needed.

## 2018-02-19 NOTE — Telephone Encounter (Signed)
Nurse with [facility] states a rx is needed for medication for pt duoneb. Please call.

## 2018-02-19 NOTE — Telephone Encounter (Signed)
Pt wife states his nebulizer medication is not at [facility]. Please call to discuss

## 2018-02-19 NOTE — Telephone Encounter (Signed)
[facility] to confirm they have the duoneb solution. They will inform pt when ready to pick up. Nothing further needed.

## 2018-03-02 NOTE — Progress Notes (Deleted)
[facility] Consultation      Assessment and Plan:  Addendum, lung cancer conference 02/06/18. Patient's biopsies were negative, EBUS biopsy samples showed positive lymph nodes without involvement of cancer.  Discussed with patient, given poor functional status would not recommend mediastinoscopy with other interventional biopsies at this time.  We will repeat PET scan in 3-4 months time, in the meantime I will check serology.  Patient and wife in agreement.  Bihilar lymphadenopathy, with increased PET uptake in left hilar node particularly, 30 pound weight loss in 1 year, appears suspicious for lung cancer. - Long discussion with patient and wife, I suspect that he has lung cancer, in order to treat this we would need to perform a biopsy which would involve bronchoscopy.  Given his comorbidities, and  poor functional status, I explained potential risks, which are slightly higher in him than the average person given above.  He is not certain whether he wants to proceed or not, and would like to think about it. -I asked him to call us back before his scheduled follow-up should he want to proceed with biopsy.  COPD with chronic bronchitis, and excess mucus production with chronic cough. -Patient is suspicious of starting inhalers, however he is agreeable to a trial of Spiriva. - Given prescription for Spiriva to see if this helps with his breathing and excess mucus production.  I have also recommended to him and his wife that he could try Mucinex over-the-counter which may also be helpful.  Unstable gait, debility, deconditioning. - Will refer to home PT to see if we can help improve his strength.   No orders of the defined types were placed in this encounter.  No follow-ups on file.   Date: 03/02/2018  MRN# [id number] Patrick Harding March 20, 1934  Referring Physician: Dr. Joylene Grapes Harding is a 84 y.o. old male seen in consultation for chief complaint of:    No chief complaint  on file.   HPI:  He is here mostly for a complaint of cough. He has been coughing mostly at night. He is here with his wife who provides most of the hsitory, the patient is in a wheelchair and looks very fatigued.  Sometimes he has trouble swallowing and coughs. The cough has been present for about a year. They have been seeing a lot of doctors and have very poor recall of what he has been told and what procedures he has had done.  He has lost about 30 pounds in past year. He smoked about a ppd until 3 years ago. He does not drive anymore. He feels that he can walk supermarket but has poor balance, so he uses a motorized cart. He gets around his home ok, he has a wheeled walker but rarely uses it at home. He has had a fall this past year once.  He loses his balance occasionally at home occasionally but he states he usually does not fall.  He is on eliquis for Afib.  He has no pets at home.  He takes not inhalers at home and refuses to use an inhaler because of a family member who had a bad reaction to one.   He has a history an EGD and colonoscopy august of 2017, found polyps.   Desat walk on RA at rest; 98% and HR 75, walked 50 feet, no dyspnea sat was 95% and HR 75. Very unstable gait, slow, leaned agaist wall, leg weakness and poor balance.   Imaging personally reviewed, CT chest/PET  scan performed on 06/18/17. Slight right paratracheal lymphadenopathy, Left hilar lymphadenopathy, slight right hilar lymphadenopathy. The hypermetabolic left hilar mass is anterior and superior to the distal left main stem bronchus near the entrance of the lingula.   SPIROMETRY: FVC was 2.59 liters, 93% of predicted FEV1 was 1.73, 82% of predicted FEV1 ratio was 67 FEF 25-75% liters per second was 40% of predicted   LUNG VOLUMES: TLC was 77% of predicted RV was 71% of predicted   DIFFUSION CAPACITY: DLCO was 55% of predicted DLCO/VA was 69% of predicted   FLOW VOLUME LOOP: Expiratory flow volume loop is  somewhat delayed  Impression spirometry showed mild obstruction TC is slightly decreased DLCO is moderately decreased    Social Hx:   Social History   Tobacco Use  . Smoking status: Former Smoker    Packs/day: 0.50    Years: 20.00    Pack years: 10.00    Types: Cigarettes    Last attempt to quit: 10/24/2015    Years since quitting: 2.3  . Smokeless tobacco: Never Used  Substance Use Topics  . Alcohol use: No    Alcohol/week: 0.0 oz    Comment: x 3 years  . Drug use: No   Medication:    Current Outpatient Medications:  .  acetaminophen (TYLENOL) 500 MG tablet, Take 1,000 mg by mouth every 6 (six) hours as needed for mild pain or headache., Disp: , Rfl:  .  AMBULATORY NON FORMULARY MEDICATION, Medication Name: nebulizer and supplies DX:J43.1, Disp: 1 each, Rfl: 0 .  apixaban (ELIQUIS) 2.5 MG TABS tablet, Take 1.25 mg by mouth 2 (two) times daily. , Disp: , Rfl:  .  clotrimazole-betamethasone (LOTRISONE) cream, Reported on 06/07/2016, Disp: , Rfl: 0 .  diltiazem (CARDIZEM CD) 180 MG 24 hr capsule, Take 180 mg by mouth daily., Disp: , Rfl:  .  ferrous fumarate (HEMOCYTE - 106 MG FE) 325 (106 Fe) MG TABS tablet, Take 325 mg by mouth daily., Disp: , Rfl:  .  ipratropium-albuterol (DUONEB) 0.5-2.5 (3) MG/3ML SOLN, Take 3 mLs by nebulization every 8 (eight) hours. And prn, Disp: 360 mL, Rfl: 11 .  metoprolol (LOPRESSOR) 50 MG tablet, Take 1 tablet (50 mg total) by mouth 2 (two) times daily., Disp: 60 tablet, Rfl: 2 .  Multiple Vitamin (MULTI-VITAMINS) TABS, Take by mouth., Disp: , Rfl:  .  omega-3 acid ethyl esters (LOVAZA) 1 g capsule, Take 1 g by mouth daily. Reported on 06/07/2016, Disp: , Rfl:  .  pantoprazole (PROTONIX) 40 MG tablet, Reported on 06/07/2016, Disp: , Rfl: 0 .  tiotropium (SPIRIVA) 18 MCG inhalation capsule, Place 1 capsule (18 mcg total) into inhaler and inhale daily., Disp: 30 capsule, Rfl: 6   Allergies:  Aspirin and Penicillins  Review of Systems: Gen:   Denies  fever, sweats, chills HEENT: Denies blurred vision, double vision. bleeds, sore throat Cvc:  No dizziness, chest pain. Resp:   Denies cough or sputum production, shortness of breath Gi: Denies swallowing difficulty, stomach pain. Gu:  Denies bladder incontinence, burning urine Ext:   No Joint pain, stiffness. Skin: No skin rash,  hives  Endoc:  No polyuria, polydipsia. Psych: No depression, insomnia. Other:  All other systems were reviewed with the patient and were negative other that what is mentioned in the HPI.   Physical Examination:   VS: There were no vitals taken for this visit.  General Appearance: No distress  Neuro:without focal findings,  speech normal,  HEENT: PERRLA, EOM intact.  Pulmonary: Scattered bilateral rhonchi.  No wheezing.  CardiovascularNormal S1,S2.  No m/r/g.   Abdomen: Benign, Soft, non-tender. Renal:  No costovertebral tenderness  GU:  No performed at this time. Endoc: No evident thyromegaly, no signs of acromegaly. Skin:   warm, no rashes, no ecchymosis  Extremities: normal, no cyanosis, clubbing.  Other findings:    LABORATORY PANEL:   CBC No results for input(s): WBC, HGB, HCT, PLT in the last 168 hours. ------------------------------------------------------------------------------------------------------------------  Chemistries  No results for input(s): NA, K, CL, CO2, GLUCOSE, BUN, CREATININE, CALCIUM, MG, AST, ALT, ALKPHOS, BILITOT in the last 168 hours.  Invalid input(s): GFRCGP ------------------------------------------------------------------------------------------------------------------  Cardiac Enzymes No results for input(s): TROPONINI in the last 168 hours. ------------------------------------------------------------  RADIOLOGY:  No results found.     Thank  you for the consultation and for allowing [facility] to assist in the care of your patient. Our recommendations are noted above.   Please contact us if we can be of further service.   Marda Stalker, MD.  Board Certified in Internal Medicine, Pulmonary Medicine, [place], and Sleep Medicine.  [facility] Office Number: [phone]  Patricia Pesa, M.D.  Merton Border, M.D  03/02/2018

## 2018-03-03 ENCOUNTER — Ambulatory Visit: Payer: Medicare Other | Admitting: Internal Medicine

## 2018-03-04 ENCOUNTER — Encounter: Payer: Self-pay | Admitting: Internal Medicine

## 2018-03-13 LAB — ACID FAST CULTURE WITH REFLEXED SENSITIVITIES (MYCOBACTERIA): Acid Fast Culture: NEGATIVE

## 2018-03-13 LAB — ACID FAST CULTURE WITH REFLEXED SENSITIVITIES

## 2018-04-01 ENCOUNTER — Other Ambulatory Visit: Payer: Self-pay | Admitting: Internal Medicine

## 2018-04-01 DIAGNOSIS — R911 Solitary pulmonary nodule: Secondary | ICD-10-CM

## 2018-04-11 ENCOUNTER — Ambulatory Visit: Payer: Medicare Other | Admitting: Internal Medicine

## 2018-04-18 ENCOUNTER — Emergency Department: Payer: Medicare Other

## 2018-04-18 ENCOUNTER — Emergency Department
Admission: EM | Admit: 2018-04-18 | Discharge: 2018-04-18 | Disposition: A | Discharge status: Home / Self Care | Payer: Medicare Other | Attending: Emergency Medicine | Admitting: Emergency Medicine

## 2018-04-18 ENCOUNTER — Encounter: Payer: Self-pay | Admitting: Emergency Medicine

## 2018-04-18 DIAGNOSIS — I110 Hypertensive heart disease with heart failure: Secondary | ICD-10-CM | POA: Diagnosis not present

## 2018-04-18 DIAGNOSIS — R531 Weakness: Secondary | ICD-10-CM | POA: Diagnosis present

## 2018-04-18 DIAGNOSIS — M542 Cervicalgia: Secondary | ICD-10-CM | POA: Insufficient documentation

## 2018-04-18 DIAGNOSIS — J449 Chronic obstructive pulmonary disease, unspecified: Secondary | ICD-10-CM | POA: Insufficient documentation

## 2018-04-18 DIAGNOSIS — I5022 Chronic systolic (congestive) heart failure: Secondary | ICD-10-CM | POA: Diagnosis not present

## 2018-04-18 DIAGNOSIS — R51 Headache: Secondary | ICD-10-CM | POA: Insufficient documentation

## 2018-04-18 DIAGNOSIS — Z87891 Personal history of nicotine dependence: Secondary | ICD-10-CM | POA: Diagnosis not present

## 2018-04-18 DIAGNOSIS — Z79899 Other long term (current) drug therapy: Secondary | ICD-10-CM | POA: Diagnosis not present

## 2018-04-18 DIAGNOSIS — Z7901 Long term (current) use of anticoagulants: Secondary | ICD-10-CM | POA: Insufficient documentation

## 2018-04-18 DIAGNOSIS — R519 Headache, unspecified: Secondary | ICD-10-CM

## 2018-04-18 LAB — CBC
HEMATOCRIT: 31.3 % — AB (ref 40.0–52.0)
HEMOGLOBIN: 10.3 g/dL — AB (ref 13.0–18.0)
MCH: 27.1 pg (ref 26.0–34.0)
MCHC: 32.8 g/dL (ref 32.0–36.0)
MCV: 82.6 fL (ref 80.0–100.0)
Platelets: 243 10*3/uL (ref 150–440)
RBC: 3.79 MIL/uL — AB (ref 4.40–5.90)
RDW: 15.7 % — ABNORMAL HIGH (ref 11.5–14.5)
WBC: 13.4 10*3/uL — ABNORMAL HIGH (ref 3.8–10.6)

## 2018-04-18 LAB — PROTIME-INR
INR: 1.13
PROTHROMBIN TIME: 14.4 s (ref 11.4–15.2)

## 2018-04-18 LAB — BASIC METABOLIC PANEL
ANION GAP: 6 (ref 5–15)
BUN: 28 mg/dL — ABNORMAL HIGH (ref 6–20)
CO2: 26 mmol/L (ref 22–32)
Calcium: 8.9 mg/dL (ref 8.9–10.3)
Chloride: 105 mmol/L (ref 101–111)
Creatinine, Ser: 1.41 mg/dL — ABNORMAL HIGH (ref 0.61–1.24)
GFR calc non Af Amer: 45 mL/min — ABNORMAL LOW (ref >60)
GFR, EST AFRICAN AMERICAN: 52 mL/min — AB (ref >60)
GLUCOSE: 111 mg/dL — AB (ref 65–99)
POTASSIUM: 4.1 mmol/L (ref 3.5–5.1)
Sodium: 137 mmol/L (ref 135–145)

## 2018-04-18 LAB — TROPONIN I
TROPONIN I: 0.03 ng/mL — AB (ref <0.03)
Troponin I: 0.03 ng/mL (ref <0.03)

## 2018-04-18 MED ORDER — CYCLOBENZAPRINE HCL 5 MG PO TABS: 5.0000 mg | ORAL_TABLET | Freq: Three times a day (TID) | ORAL | 0 refills | Status: DC | PRN

## 2018-04-18 MED ORDER — METOCLOPRAMIDE HCL 5 MG/ML IJ SOLN
10.0000 mg | Freq: Once | INTRAMUSCULAR | Status: AC
  Administered 2018-04-18: 10 mg via INTRAVENOUS
  Filled 2018-04-18: qty 2

## 2018-04-18 MED ORDER — KETOROLAC TROMETHAMINE 30 MG/ML IJ SOLN
30.0000 mg | Freq: Once | INTRAMUSCULAR | Status: AC
  Administered 2018-04-18: 30 mg via INTRAVENOUS
  Filled 2018-04-18: qty 1

## 2018-04-18 MED ORDER — IOPAMIDOL (ISOVUE-370) INJECTION 76%
75.0000 mL | Freq: Once | INTRAVENOUS | Status: AC | PRN
  Administered 2018-04-18: 75 mL via INTRAVENOUS

## 2018-04-18 MED ORDER — TRAMADOL HCL 50 MG PO TABS: 50.0000 mg | ORAL_TABLET | Freq: Four times a day (QID) | ORAL | 0 refills | Status: AC | PRN

## 2018-04-18 NOTE — ED Notes (Signed)
Date and time results received: 04/18/18 1920 (use smartphrase ".now" to insert current time)  Test: Troponin Critical Value: 0.03  Name of Provider Notified: Dr. Cherylann Banas  Orders Received? Or Actions Taken?:

## 2018-04-18 NOTE — ED Triage Notes (Signed)
Patient presents to the ED with feeling badly since this morning.  Patient is complaining of headache on the left side of patient's head into his left shoulder.  Patient is complaining of generalized weakness.  Patient's wife states his heart rate was in the 40s at home.  Patient appears tired.

## 2018-04-18 NOTE — ED Provider Notes (Signed)
[facility] Emergency Department Provider Note ____________________________________________   First MD Initiated Contact with Patient 04/18/18 1809     (approximate)  I have reviewed the triage vital signs and the nursing notes.   HISTORY  Chief Complaint Weakness    HPI Patrick Harding is a 84 y.o. male with PMH as noted below who presents with headache, acute onset when he woke approximately 8 hours ago, located mainly in the left side of his neck behind the ear and towards the left side of his head, described as constant but with intermittent more severe moments, and not associated with weakness or numbness, nausea or vomiting, or vision changes.  He reports prior somewhat similar headaches that were less severe but this is the first headache he has had quite like this.  He denies any trauma or injury.  Past Medical History:  Diagnosis Date  . Anemia   . Aortic aneurysm without rupture ([place]) 12/26/2015  . Arrhythmia   . Atrial fibrillation ([place])   . Benign neoplasm of cecum   . Benign neoplasm of transverse colon   . Blood in stool   . Cachexia ([place]) 05/11/2017  . CAP (community acquired pneumonia) 03/20/2016  . Chronic systolic CHF (congestive heart failure), NYHA class 3 ([place]) 12/26/2015   Overview:  Global ef 25%  . COPD (chronic obstructive pulmonary disease) ([place])   . Dysarthria due to recent stroke 03/12/2016  . Dyspnea on exertion 01/03/2016  . Dysrhythmia    atrial fib.  . Erectile dysfunction   . Essential hypertension 04/14/2015  . Gastritis   . Generalized weakness 05/11/2017  . Hyperlipidemia   . Hypertension   . Left elbow pain 05/11/2017  . Lower extremity weakness (Right) 01/24/2016  . 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 Lumbar facet syndrome 08/10/2015  . Lumbar foraminal stenosis (Left Moderate-severe L4-5) (Bilateral Moderate L5-S1) 03/12/2016  . Nonrheumatic aortic valve insufficiency 01/03/2016  . Panlobular emphysema ([place]) 01/03/2016  .  Persistent atrial fibrillation ([place]) 04/14/2015   Overview:  Last Assessment & Plan:  His ventricular rate continues to be elevated. Thus, I elected to increase the dose of metoprolol to 50 mg twice daily. Continue anticoagulation. He is complaining of increased dyspnea but his oxygen saturation is 95% on room air and his lungs are relatively clear but he does have diminished breath sounds. EKG does not show any new changes. Once his heart rate i  . Pressure ulcer 03/21/2016  . Rectal polyp   . Reflux esophagitis   . Renal insufficiency   . Sacroiliac joint dysfunction 08/10/2015  . Stroke determined by clinical assessment ([place]) 03/12/2016  . Thoracic ascending aortic aneurysm ([place]) 01/11/2016   Overview:  4.8cm 12/2015  . Vitamin D deficiency   . Weakness of both legs 12/26/2015    Patient Active Problem List   Diagnosis Date Noted  . Acute pain of left shoulder 05/11/2017  . Loss of appetite 05/11/2017  . Cachexia ([place]) 05/11/2017  . Generalized weakness 05/11/2017  . Left elbow pain 05/11/2017  . Neutrophilia 05/11/2017  . Blood in stool   . Benign neoplasm of cecum   . Rectal polyp   . Benign neoplasm of transverse colon   . Reflux esophagitis   . Gastritis   . HLD (hyperlipidemia) 03/28/2016  . AKI (acute kidney injury) ([place]) 03/28/2016  . 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 Pressure ulcer 03/21/2016  . CAP (community acquired pneumonia) 03/20/2016  . Protein-calorie malnutrition, severe 03/20/2016  . Speech and language deficits 03/12/2016  .  Dysarthria due to recent stroke 03/12/2016  . Stroke determined by clinical assessment ([place]) 03/12/2016  . Lumbar foraminal stenosis (Left Moderate-severe L4-5) (Bilateral Moderate L5-S1) 03/12/2016  . Abnormal MRI, lumbar spine (2012) 01/24/2016  . Chronic low back pain (Location of Secondary source of pain) (Bilateral) (R>L) 01/24/2016  . Chronic midline back pain 01/24/2016  . Chronic lumbar radicular pain (Location of Primary Source of  Pain) (Right) (L3/L4 dermatome) 01/24/2016  . Chronic pain 01/24/2016  . Chronic anticoagulation 01/24/2016  . Long-term use of high-risk medication 01/24/2016  . 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 Lower extremity weakness (Right) 01/24/2016  . Thoracic ascending aortic aneurysm ([place]) 01/11/2016  . Dyspnea on exertion 01/03/2016  . Former cigarette smoker 01/03/2016  . Nonrheumatic aortic valve insufficiency 01/03/2016  . Panlobular emphysema ([place]) 01/03/2016  . Weight loss, abnormal 12/26/2015  . Weakness of both legs 12/26/2015  . Pulmonary nodule, right 12/26/2015  . Aortic aneurysm without rupture ([place]) 12/26/2015  . Chronic systolic CHF (congestive heart failure), NYHA class 3 ([place]) 12/26/2015  . Spinal stenosis, lumbar region, with neurogenic claudication 08/10/2015  . Lumbar facet syndrome 08/10/2015  . Sacroiliac joint dysfunction 08/10/2015  . Atrial fibrillation ([place]) 04/14/2015  . Essential hypertension 04/14/2015  . Persistent atrial fibrillation ([place]) 04/14/2015    Past Surgical History:  Procedure Laterality Date  . BACK SURGERY     lumbar  . COLONOSCOPY WITH PROPOFOL N/A 07/17/2016   Procedure: COLONOSCOPY WITH PROPOFOL;  Surgeon: [name], MD;  Location: [facility] ENDOSCOPY;  Service: Endoscopy;  Laterality: N/A;  . ENDOBRONCHIAL ULTRASOUND N/A 01/27/2018   Procedure: ENDOBRONCHIAL ULTRASOUND;  Surgeon: [name], MD;  Location: [facility] ORS;  Service: Pulmonary;  Laterality: N/A;  . ESOPHAGOGASTRODUODENOSCOPY (EGD) WITH PROPOFOL N/A 07/17/2016   Procedure: ESOPHAGOGASTRODUODENOSCOPY (EGD) WITH PROPOFOL;  Surgeon: [name], MD;  Location: [facility] ENDOSCOPY;  Service: Endoscopy;  Laterality: N/A;  . EYE SURGERY     cataract right  . FACIAL FRACTURE SURGERY    . Head surgery Left   . TONSILLECTOMY      Prior to Admission medications   Medication Sig Start Date End Date Taking? Authorizing Provider  acetaminophen  (TYLENOL) 500 MG tablet Take 1,000 mg by mouth every 6 (six) hours as needed for mild pain or headache.    [provider]  AMBULATORY NON FORMULARY MEDICATION Medication Name: nebulizer and supplies DX:J43.1 02/05/18   [name], MD  apixaban (ELIQUIS) 2.5 MG TABS tablet Take 1.25 mg by mouth 2 (two) times daily.  05/11/17   [provider]  clotrimazole-betamethasone (LOTRISONE) cream Reported on 06/07/2016 04/02/16   [provider]  cyclobenzaprine (FLEXERIL) 5 MG tablet Take 1 tablet (5 mg total) by mouth 3 (three) times daily as needed for muscle spasms. 04/18/18   Arta Silence, MD  diltiazem (CARDIZEM CD) 180 MG 24 hr capsule Take 180 mg by mouth daily.    [provider]  ferrous fumarate (HEMOCYTE - 106 MG FE) 325 (106 Fe) MG TABS tablet Take 325 mg by mouth daily.    [provider]  ipratropium-albuterol (DUONEB) 0.5-2.5 (3) MG/3ML SOLN Take 3 mLs by nebulization every 8 (eight) hours. And prn 02/05/18   [name], MD  metoprolol (LOPRESSOR) 50 MG tablet Take 1 tablet (50 mg total) by mouth 2 (two) times daily. 12/06/15   Patrick Hampshire, MD  Multiple Vitamin (MULTI-VITAMINS) TABS Take by mouth.    [provider]  omega-3 acid ethyl esters (LOVAZA) 1  g capsule Take 1 g by mouth daily. Reported on 06/07/2016    [provider]  pantoprazole (PROTONIX) 40 MG tablet Reported on 06/07/2016 05/25/16   [provider]  tiotropium (SPIRIVA) 18 MCG inhalation capsule Place 1 capsule (18 mcg total) into inhaler and inhale daily. 01/17/18   [name], MD  traMADol (ULTRAM) 50 MG tablet Take 1 tablet (50 mg total) by mouth every 6 (six) hours as needed for up to 5 days for severe pain. 04/18/18 04/23/18  Arta Silence, MD    Allergies Aspirin and Penicillins  Family History  Problem Relation Age of Onset  . Arthritis Father   . Bladder Cancer Neg Hx   . Kidney cancer Neg Hx   .  Prolactinoma Neg Hx   . Prostate cancer Neg Hx     Social History Social History   Tobacco Use  . Smoking status: Former Smoker    Packs/day: 0.50    Years: 20.00    Pack years: 10.00    Types: Cigarettes    Last attempt to quit: 10/24/2015    Years since quitting: 2.4  . Smokeless tobacco: Never Used  Substance Use Topics  . Alcohol use: No    Alcohol/week: 0.0 oz    Comment: x 3 years  . Drug use: No    Review of Systems  Constitutional: No fever. Eyes: No visual changes. ENT: Positive for neck pain. Negative for ear pain. Cardiovascular: Denies chest pain. Respiratory: Denies shortness of breath. Gastrointestinal: No nausea, no vomiting.  Genitourinary: Negative for flank pain.  Musculoskeletal: Negative for back pain. Skin: Negative for rash. Neurological: Positive for headaches.  Negative for focal weakness or numbness.   ____________________________________________   PHYSICAL EXAM:  VITAL SIGNS: ED Triage Vitals  Enc Vitals Group     BP 04/18/18 1828 (!) 176/68     Pulse Rate 04/18/18 1828 (!) 53     Resp 04/18/18 1828 20     Temp --      Temp Source 04/18/18 1752 Oral     SpO2 04/18/18 1828 99 %     Weight 04/18/18 1752 127 lb (57.6 kg)     Height 04/18/18 1752 6' (1.829 m)     Head Circumference --      Peak Flow --      Pain Score 04/18/18 1752 10     Pain Loc --      Pain Edu? --      Excl. in [place]? --     Constitutional: Alert and oriented.  Relatively well appearing and in no acute distress. Eyes: Conjunctivae are normal.  EOMI.  PERRLA. Head: Atraumatic.  TMs appear normal bilaterally.  No mastoid tenderness. Nose: No congestion/rhinnorhea. Mouth/Throat: Mucous membranes are moist.   Neck: Pain on range of motion turning the head to the left.  Reproducible muscle tenderness to the left side of the neck and posterior to the ear.  No swelling. Cardiovascular: Normal rate, regular rhythm. Grossly normal heart sounds.  Good peripheral  circulation. Respiratory: Normal respiratory effort.  No retractions. Lungs CTAB. Gastrointestinal: No distention.  Genitourinary: No flank tenderness. Musculoskeletal: No lower extremity edema.  Extremities warm and well perfused.  Neurologic: 5/5 motor strength and intact fine touch sensation to all extremities.  Normal coordination with no ataxia on finger-to-nose.  Normal speech and language. No gross focal neurologic deficits are appreciated.  Skin:  Skin is warm and dry. No rash noted. Psychiatric: Mood and affect are normal. Speech and behavior  are normal.  ____________________________________________   LABS (all labs ordered are listed, but only abnormal results are displayed)  Labs Reviewed  BASIC METABOLIC PANEL - Abnormal; Notable for the following components:      Result Value   Glucose, Bld 111 (*)    BUN 28 (*)    Creatinine, Ser 1.41 (*)    GFR calc non Af Amer 45 (*)    GFR calc Af Amer 52 (*)    All other components within normal limits  CBC - Abnormal; Notable for the following components:   WBC 13.4 (*)    RBC 3.79 (*)    Hemoglobin 10.3 (*)    HCT 31.3 (*)    RDW 15.7 (*)    All other components within normal limits  TROPONIN I - Abnormal; Notable for the following components:   Troponin I 0.03 (*)    All other components within normal limits  TROPONIN I - Abnormal; Notable for the following components:   Troponin I 0.03 (*)    All other components within normal limits  PROTIME-INR   ____________________________________________  EKG  ED ECG REPORT I, Arta Silence, the attending physician, personally viewed and interpreted this ECG.  Date: 04/18/2018 EKG Time: 1752 Rate: 46 Rhythm: Atrial fibrillation with slow ventricular response QRS Axis: normal Intervals: normal ST/T Wave abnormalities: Nonspecific anterior ST abnormalities Narrative Interpretation: no evidence of acute ischemia; no significant change when compared to EKG of  01/23/2018  ____________________________________________  RADIOLOGY  CT head: No ICH or other acute findings CT angio head: No acute findings CT angio neck: No acute findings  ____________________________________________   PROCEDURES  Procedure(s) performed: No  Procedures  Critical Care performed: No ____________________________________________   INITIAL IMPRESSION / ASSESSMENT AND PLAN / ED COURSE  Pertinent labs & imaging results that were available during my care of the patient were reviewed by me and considered in my medical decision making (see chart for details).  84 year old male presents with headache and left neck pain since this morning which have worsened throughout the day, with no associated nausea, vomiting, vision changes, or other neurologic symptoms.  No trauma.  Past medical records reviewed in Epic and are noncontributory.  On exam, the patient is relatively well-appearing, somewhat hypertensive and borderline bradycardic but otherwise normal vital signs.  Neuro exam is normal.  The remainder the exam is as described above with reproducible left neck tenderness.  Overall given the reproducible tenderness, worsening with turning of the head, and the location, the presentation is most consistent with muscular strain/spasm.  Initial CT head obtained from triage is negative.  Differential also includes migraine, cluster headache, or tension.  Plan for labs, symptomatic treatment, and reassess.  I have a low suspicion for vascular etiology but given the patient's age, if his creatinine is adequate we will obtain CT angio as well.    ----------------------------------------- 11:18 PM on 04/18/2018 -----------------------------------------  CT head without contrast and CT angiogram of the head and neck are negative.  Patient's headache was significantly improved after the initial medication.  I placed him on oxygen in case this could be a cluster type headache,  and his headache further improve.  Patient's lab work-up was unremarkable except for troponin which was minimally elevated.  The patient has no EKG changes, no chest pain or difficulty breathing, or any other symptoms to suggest ACS.  Repeat troponin after 3 hours was unchanged.  There is no evidence of acute cardiac etiology of the patient's symptoms.  The patient  is remained stable in the ED for the last several hours.  He feels much better and would like to go home.  I instructed him about the likely causes of the headache, and will prescribe a muscle relaxant.  Return precautions given, and the patient and his wife expressed understanding.  ____________________________________________   FINAL CLINICAL IMPRESSION(S) / ED DIAGNOSES  Final diagnoses:  Neck pain  Nonintractable headache, unspecified chronicity pattern, unspecified headache type      NEW MEDICATIONS STARTED DURING THIS VISIT:  New Prescriptions   CYCLOBENZAPRINE (FLEXERIL) 5 MG TABLET    Take 1 tablet (5 mg total) by mouth 3 (three) times daily as needed for muscle spasms.   TRAMADOL (ULTRAM) 50 MG TABLET    Take 1 tablet (50 mg total) by mouth every 6 (six) hours as needed for up to 5 days for severe pain.     Note:  This document was prepared using Dragon voice recognition software and may include unintentional dictation errors.    Arta Silence, MD 04/18/18 2320

## 2018-04-18 NOTE — Discharge Instructions (Addendum)
Return to the ER for new, worsening, or persistent severe pain, weakness or numbness, vision changes, fevers, or any other new or worsening symptoms that concern you.  Follow-up with your regular doctor in 1 to 2 weeks.  We have prescribed Flexeril (muscle relaxant) and tramadol for pain.  You can take over-the-counter medicine such as Tylenol or ibuprofen for the pain, and only take the prescribed medications as needed for more severe pain.

## 2018-09-08 ENCOUNTER — Other Ambulatory Visit: Payer: Self-pay | Admitting: Registered Nurse

## 2018-09-08 DIAGNOSIS — S99921A Unspecified injury of right foot, initial encounter: Secondary | ICD-10-CM

## 2018-10-08 ENCOUNTER — Emergency Department: Payer: Medicare Other

## 2018-10-08 ENCOUNTER — Encounter: Payer: Self-pay | Admitting: Emergency Medicine

## 2018-10-08 ENCOUNTER — Other Ambulatory Visit: Payer: Self-pay

## 2018-10-08 ENCOUNTER — Emergency Department
Admission: EM | Admit: 2018-10-08 | Discharge: 2018-10-08 | Disposition: A | Discharge status: Home / Self Care | Payer: Medicare Other | Attending: Emergency Medicine | Admitting: Emergency Medicine

## 2018-10-08 DIAGNOSIS — R509 Fever, unspecified: Secondary | ICD-10-CM | POA: Diagnosis present

## 2018-10-08 DIAGNOSIS — I110 Hypertensive heart disease with heart failure: Secondary | ICD-10-CM | POA: Insufficient documentation

## 2018-10-08 DIAGNOSIS — I5022 Chronic systolic (congestive) heart failure: Secondary | ICD-10-CM | POA: Insufficient documentation

## 2018-10-08 DIAGNOSIS — C3412 Malignant neoplasm of upper lobe, left bronchus or lung: Secondary | ICD-10-CM | POA: Insufficient documentation

## 2018-10-08 DIAGNOSIS — Z79899 Other long term (current) drug therapy: Secondary | ICD-10-CM | POA: Insufficient documentation

## 2018-10-08 DIAGNOSIS — Z87891 Personal history of nicotine dependence: Secondary | ICD-10-CM | POA: Diagnosis not present

## 2018-10-08 LAB — BASIC METABOLIC PANEL
ANION GAP: 7 (ref 5–15)
BUN: 25 mg/dL — ABNORMAL HIGH (ref 8–23)
CO2: 26 mmol/L (ref 22–32)
Calcium: 8.5 mg/dL — ABNORMAL LOW (ref 8.9–10.3)
Chloride: 103 mmol/L (ref 98–111)
Creatinine, Ser: 1.74 mg/dL — ABNORMAL HIGH (ref 0.61–1.24)
GFR calc non Af Amer: 35 mL/min — ABNORMAL LOW (ref >60)
GFR, EST AFRICAN AMERICAN: 40 mL/min — AB (ref >60)
Glucose, Bld: 102 mg/dL — ABNORMAL HIGH (ref 70–99)
POTASSIUM: 4.6 mmol/L (ref 3.5–5.1)
SODIUM: 136 mmol/L (ref 135–145)

## 2018-10-08 LAB — HEPATIC FUNCTION PANEL
ALK PHOS: 96 U/L (ref 38–126)
ALT: 18 U/L (ref 0–44)
AST: 23 U/L (ref 15–41)
Albumin: 3.1 g/dL — ABNORMAL LOW (ref 3.5–5.0)
BILIRUBIN INDIRECT: 0.6 mg/dL (ref 0.3–0.9)
Bilirubin, Direct: 0.3 mg/dL — ABNORMAL HIGH (ref 0.0–0.2)
TOTAL PROTEIN: 7.6 g/dL (ref 6.5–8.1)
Total Bilirubin: 0.9 mg/dL (ref 0.3–1.2)

## 2018-10-08 LAB — CBC
HEMATOCRIT: 33.3 % — AB (ref 39.0–52.0)
HEMOGLOBIN: 10.5 g/dL — AB (ref 13.0–17.0)
MCH: 27.7 pg (ref 26.0–34.0)
MCHC: 31.5 g/dL (ref 30.0–36.0)
MCV: 87.9 fL (ref 80.0–100.0)
NRBC: 0 % (ref 0.0–0.2)
Platelets: 249 10*3/uL (ref 150–400)
RBC: 3.79 MIL/uL — ABNORMAL LOW (ref 4.22–5.81)
RDW: 15.3 % (ref 11.5–15.5)
WBC: 13.4 10*3/uL — AB (ref 4.0–10.5)

## 2018-10-08 LAB — TROPONIN I: TROPONIN I: 0.03 ng/mL — AB (ref <0.03)

## 2018-10-08 MED ORDER — IOHEXOL 300 MG/ML  SOLN
60.0000 mL | Freq: Once | INTRAMUSCULAR | Status: AC | PRN
  Administered 2018-10-08: 60 mL via INTRAVENOUS

## 2018-10-08 NOTE — ED Provider Notes (Signed)
[facility] Emergency Department Provider Note   ____________________________________________   First MD Initiated Contact with Patient 10/08/18 1720     (approximate)  I have reviewed the triage vital signs and the nursing notes.   HISTORY  Chief Complaint Fever and Weakness    HPI Abdurrahman Petersheim is a 84 y.o. male who complains of fever the highest is been is 101.  Is been going on for the last 3 days.  He has an occasional cough nonproductive.  He feels weak all over and also achy all over.   Past Medical History:  Diagnosis Date  . Anemia   . Aortic aneurysm without rupture ([place]) 12/26/2015  . Arrhythmia   . Atrial fibrillation ([place])   . Benign neoplasm of cecum   . Benign neoplasm of transverse colon   . Blood in stool   . Cachexia ([place]) 05/11/2017  . CAP (community acquired pneumonia) 03/20/2016  . Chronic systolic CHF (congestive heart failure), NYHA class 3 ([place]) 12/26/2015   Overview:  Global ef 25%  . COPD (chronic obstructive pulmonary disease) ([place])   . Dysarthria due to recent stroke 03/12/2016  . Dyspnea on exertion 01/03/2016  . Dysrhythmia    atrial fib.  . Erectile dysfunction   . Essential hypertension 04/14/2015  . Gastritis   . Generalized weakness 05/11/2017  . Hyperlipidemia   . Hypertension   . Left elbow pain 05/11/2017  . Lower extremity weakness (Right) 01/24/2016  . 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 Lumbar facet syndrome 08/10/2015  . Lumbar foraminal stenosis (Left Moderate-severe L4-5) (Bilateral Moderate L5-S1) 03/12/2016  . Nonrheumatic aortic valve insufficiency 01/03/2016  . Panlobular emphysema ([place]) 01/03/2016  . Persistent atrial fibrillation 04/14/2015   Overview:  Last Assessment & Plan:  His ventricular rate continues to be elevated. Thus, I elected to increase the dose of metoprolol to 50 mg twice daily. Continue anticoagulation. He is complaining of increased dyspnea but his oxygen saturation is 95% on room air and his lungs  are relatively clear but he does have diminished breath sounds. EKG does not show any new changes. Once his heart rate i  . Pressure ulcer 03/21/2016  . Rectal polyp   . Reflux esophagitis   . Renal insufficiency   . Sacroiliac joint dysfunction 08/10/2015  . Stroke determined by clinical assessment ([place]) 03/12/2016  . Thoracic ascending aortic aneurysm ([place]) 01/11/2016   Overview:  4.8cm 12/2015  . Vitamin D deficiency   . Weakness of both legs 12/26/2015    Patient Active Problem List   Diagnosis Date Noted  . Acute pain of left shoulder 05/11/2017  . Loss of appetite 05/11/2017  . Cachexia ([place]) 05/11/2017  . Generalized weakness 05/11/2017  . Left elbow pain 05/11/2017  . Neutrophilia 05/11/2017  . Blood in stool   . Benign neoplasm of cecum   . Rectal polyp   . Benign neoplasm of transverse colon   . Reflux esophagitis   . Gastritis   . HLD (hyperlipidemia) 03/28/2016  . AKI (acute kidney injury) ([place]) 03/28/2016  . 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 Pressure ulcer 03/21/2016  . CAP (community acquired pneumonia) 03/20/2016  . Protein-calorie malnutrition, severe 03/20/2016  . Speech and language deficits 03/12/2016  . Dysarthria due to recent stroke 03/12/2016  . Stroke determined by clinical assessment ([place]) 03/12/2016  . Lumbar foraminal stenosis (Left Moderate-severe L4-5) (Bilateral Moderate L5-S1) 03/12/2016  . Abnormal MRI, lumbar spine (2012) 01/24/2016  . Chronic low back pain (Location of Secondary source of  pain) (Bilateral) (R>L) 01/24/2016  . Chronic midline back pain 01/24/2016  . Chronic lumbar radicular pain (Location of Primary Source of Pain) (Right) (L3/L4 dermatome) 01/24/2016  . Chronic pain 01/24/2016  . Chronic anticoagulation 01/24/2016  . Long-term use of high-risk medication 01/24/2016  . 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 Lower extremity weakness (Right) 01/24/2016  . Thoracic ascending aortic aneurysm ([place])  01/11/2016  . Dyspnea on exertion 01/03/2016  . Former cigarette smoker 01/03/2016  . Nonrheumatic aortic valve insufficiency 01/03/2016  . Panlobular emphysema ([place]) 01/03/2016  . Weight loss, abnormal 12/26/2015  . Weakness of both legs 12/26/2015  . Pulmonary nodule, right 12/26/2015  . Aortic aneurysm without rupture ([place]) 12/26/2015  . Chronic systolic CHF (congestive heart failure), NYHA class 3 ([place]) 12/26/2015  . Spinal stenosis, lumbar region, with neurogenic claudication 08/10/2015  . Lumbar facet syndrome 08/10/2015  . Sacroiliac joint dysfunction 08/10/2015  . Atrial fibrillation ([place]) 04/14/2015  . Essential hypertension 04/14/2015  . Persistent atrial fibrillation 04/14/2015    Past Surgical History:  Procedure Laterality Date  . BACK SURGERY     lumbar  . COLONOSCOPY WITH PROPOFOL N/A 07/17/2016   Procedure: COLONOSCOPY WITH PROPOFOL;  Surgeon: [name], MD;  Location: [facility] ENDOSCOPY;  Service: Endoscopy;  Laterality: N/A;  . ENDOBRONCHIAL ULTRASOUND N/A 01/27/2018   Procedure: ENDOBRONCHIAL ULTRASOUND;  Surgeon: [name], MD;  Location: [facility] ORS;  Service: Pulmonary;  Laterality: N/A;  . ESOPHAGOGASTRODUODENOSCOPY (EGD) WITH PROPOFOL N/A 07/17/2016   Procedure: ESOPHAGOGASTRODUODENOSCOPY (EGD) WITH PROPOFOL;  Surgeon: [name], MD;  Location: [facility] ENDOSCOPY;  Service: Endoscopy;  Laterality: N/A;  . EYE SURGERY     cataract right  . FACIAL FRACTURE SURGERY    . Head surgery Left   . TONSILLECTOMY      Prior to Admission medications   Medication Sig Start Date End Date Taking? Authorizing Provider  acetaminophen (TYLENOL) 500 MG tablet Take 1,000 mg by mouth every 6 (six) hours as needed for mild pain or headache.    [provider]  AMBULATORY NON FORMULARY MEDICATION Medication Name: nebulizer and supplies DX:J43.1 02/05/18   [name], MD  apixaban (ELIQUIS) 2.5 MG TABS tablet Take 1.25 mg by mouth 2 (two) times daily.   05/11/17   [provider]  clotrimazole-betamethasone (LOTRISONE) cream Reported on 06/07/2016 04/02/16   [provider]  cyclobenzaprine (FLEXERIL) 5 MG tablet Take 1 tablet (5 mg total) by mouth 3 (three) times daily as needed for muscle spasms. 04/18/18   Arta Silence, MD  diltiazem (CARDIZEM CD) 180 MG 24 hr capsule Take 180 mg by mouth daily.    [provider]  ferrous fumarate (HEMOCYTE - 106 MG FE) 325 (106 Fe) MG TABS tablet Take 325 mg by mouth daily.    [provider]  ipratropium-albuterol (DUONEB) 0.5-2.5 (3) MG/3ML SOLN Take 3 mLs by nebulization every 8 (eight) hours. And prn 02/05/18   [name], MD  metoprolol (LOPRESSOR) 50 MG tablet Take 1 tablet (50 mg total) by mouth 2 (two) times daily. 12/06/15   Wellington Hampshire, MD  Multiple Vitamin (MULTI-VITAMINS) TABS Take by mouth.    [provider]  omega-3 acid ethyl esters (LOVAZA) 1 g capsule Take 1 g by mouth daily. Reported on 06/07/2016    [provider]  pantoprazole (PROTONIX) 40 MG tablet Reported on 06/07/2016 05/25/16   [provider]  tiotropium (SPIRIVA) 18 MCG inhalation capsule Place 1 capsule (18 mcg total) into inhaler  and inhale daily. 01/17/18   [name], MD    Allergies Aspirin and Penicillins  Family History  Problem Relation Age of Onset  . Arthritis Father   . Bladder Cancer Neg Hx   . Kidney cancer Neg Hx   . Prolactinoma Neg Hx   . Prostate cancer Neg Hx     Social History Social History   Tobacco Use  . Smoking status: Former Smoker    Packs/day: 0.50    Years: 20.00    Pack years: 10.00    Types: Cigarettes    Last attempt to quit: 10/24/2015    Years since quitting: 2.9  . Smokeless tobacco: Never Used  Substance Use Topics  . Alcohol use: No    Alcohol/week: 0.0 standard drinks    Comment: x 3 years  . Drug use: No    Review of Systems  Constitutional:  fever/chills Eyes: No visual  changes. ENT: No sore throat. Cardiovascular: Denies chest pain. Respiratory: Denies shortness of breath. Gastrointestinal: No abdominal pain.  No nausea, no vomiting.  No diarrhea.  No constipation. Genitourinary: Negative for dysuria. Musculoskeletal: Negative for back pain. Skin: Negative for rash. Neurological: Negative for headaches, focal weakness   ____________________________________________   PHYSICAL EXAM:  VITAL SIGNS: ED Triage Vitals [10/08/18 1544]  Enc Vitals Group     BP (!) 98/51     Pulse Rate 89     Resp 16     Temp 98.6 F (37 C)     Temp Source Oral     SpO2 98 %     Weight      Height      Head Circumference      Peak Flow      Pain Score 7     Pain Loc      Pain Edu?      Excl. in [place]?     Constitutional: Alert and oriented. Well appearing and in no acute distress. Eyes: Conjunctivae are normal.  Head: Atraumatic. Nose: No congestion/rhinnorhea. Mouth/Throat: Mucous membranes are moist.  Oropharynx non-erythematous. Neck: No stridor.   Cardiovascular: Normal rate, regular rhythm. Grossly normal heart sounds.  Good peripheral circulation. Respiratory: Normal respiratory effort.  No retractions. Lungs CTAB. Gastrointestinal: Soft and nontender. No distention. No abdominal bruits. No CVA tenderness. Musculoskeletal: No lower extremity tenderness nor edema.    Neurologic:  Normal speech and language. No gross focal neurologic deficits are appreciated.  Skin:  Skin is warm, dry and intact. No rash noted. Psychiatric: Mood and affect are normal. Speech and behavior are normal.  ____________________________________________   LABS (all labs ordered are listed, but only abnormal results are displayed)  Labs Reviewed  BASIC METABOLIC PANEL - Abnormal; Notable for the following components:      Result Value   Glucose, Bld 102 (*)    BUN 25 (*)    Creatinine, Ser 1.74 (*)    Calcium 8.5 (*)    GFR calc non Af Amer 35 (*)    GFR calc Af Amer 40  (*)    All other components within normal limits  CBC - Abnormal; Notable for the following components:   WBC 13.4 (*)    RBC 3.79 (*)    Hemoglobin 10.5 (*)    HCT 33.3 (*)    All other components within normal limits  TROPONIN I - Abnormal; Notable for the following components:   Troponin I 0.03 (*)    All other components within normal limits  HEPATIC FUNCTION  PANEL - Abnormal; Notable for the following components:   Albumin 3.1 (*)    Bilirubin, Direct 0.3 (*)    All other components within normal limits  URINE CULTURE  CULTURE, BLOOD (ROUTINE X 2)  CULTURE, BLOOD (ROUTINE X 2)  URINALYSIS, COMPLETE (UACMP) WITH MICROSCOPIC  CBG MONITORING, ED   ____________________________________________  EKG  KG read interpreted by me shows A.  Letter at a rate of 90 with normal axis no acute ST-T wave changes looks similar to prior ____________________________________________  RADIOLOGY  ED MD interpretation: Head CT shows no cause for the patient's fever or frontal headache  Official radiology report(s): Dg Chest 2 View  Result Date: 10/08/2018 CLINICAL DATA:  Fever and weakness. EXAM: CHEST - 2 VIEW COMPARISON:  PET CT 06/18/2017, CXR 04/18/2018 FINDINGS: Ovoid masslike opacity, not apparent on prior chest radiograph in the left upper lobe may represent worsening of known mediastinal adenopathy versus an intraparenchymal lung mass. This measures approximately 2.5 x 3.3 x 3 cm. CT with IV contrast may help for better assessment. Mild pulmonary hyperinflation. No pulmonary edema, effusion or pneumothorax. No aggressive osseous lesions. Tortuous nonaneurysmal atherosclerotic aorta. The heart size is top-normal. IMPRESSION: 1. Ovoid masslike opacity left upper lobe measuring 2.5 x 3.3 x 3 cm possibly representing a pulmonary mass versus worsening of mediastinal adenopathy. Suggest CT with IV contrast for further assessment. 2. Aortic atherosclerosis. Electronically Signed   By: Ashley Royalty  M.D.   On: 10/08/2018 18:30   Ct Head Wo Contrast  Result Date: 10/08/2018 CLINICAL DATA:  States increased weakness and fever for a few days. Pt c/o frequent urination and headache. Per family pt increased falls over the past few days, denies head injuries. EXAM: CT HEAD WITHOUT CONTRAST TECHNIQUE: Contiguous axial images were obtained from the base of the skull through the vertex without intravenous contrast. COMPARISON:  04/18/2018 FINDINGS: Brain: No evidence of acute infarction, hemorrhage, extra-axial collection, ventriculomegaly, or mass effect. Mild left MCA territory encephalomalacia. Generalized cerebral atrophy. Periventricular white matter low attenuation likely secondary to microangiopathy. Vascular: Cerebrovascular atherosclerotic calcifications are noted. Skull: Calvarium is intact. Prior ORIF of the right zygoma. 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 Sinuses/Orbits: Visualized portions of the orbits are unremarkable. Visualized portions of the paranasal sinuses are unremarkable. Visualized portions of the mastoid air cells are unremarkable. Other: None. IMPRESSION: No acute intracranial pathology. Electronically Signed   By: Kathreen Devoid   On: 10/08/2018 16:20   Ct Chest W Contrast  Result Date: 10/08/2018 CLINICAL DATA:  Increased weakness and fever past few days. Free urination and headache. Increased falls recently. EXAM: CT CHEST WITH CONTRAST TECHNIQUE: Multidetector CT imaging of the chest was performed during intravenous contrast administration. CONTRAST:  38mL OMNIPAQUE IOHEXOL 300 MG/ML  SOLN COMPARISON:  PET-CT 06/18/2017 and chest CT 03/19/2016 as well as 12/07/2015 FINDINGS: Cardiovascular: Mild cardiomegaly. Very small amount of pericardial fluid. Aneurysmal dilatation of the ascending thoracic aorta measuring 4.8 cm in AP diameter. Aneurysmal dilatation of the descending thoracic aorta measuring  4.8 cm in AP diameter. Mild calcified plaque throughout the thoracic aorta with minimal peripheral mural thrombus. Calcified plaque over the left main and 3 vessel coronary arteries. Remaining vascular structures are unremarkable. Mediastinum/Nodes: 1.6 cm AP window lymph node abutting the suprahilar mass. 1.2 cm precarinal lymph node 1.3 cm right hilar lymph node. Few other smaller shotty mediastinal lymph nodes are present. Couple subcentimeter right pericardiophrenic lymph nodes. Lungs/Pleura: Lungs  are adequately inflated. There is a left suprahilar mass extending into the AP window/mediastinum continuous with adjacent adenopathy and measuring approximately 3.4 x 6 cm in AP and transverse dimension. Multiple small nodular opacities over the posterior left upper lobe above the mass. 4 mm nodule over the lingula. 4-5 mm nodular density adjacent the right major fissure. Peripheral reticulonodular density within the right lower lobe. Few small subcentimeter hazy nodular densities within the right upper and lower lobe. No effusion. Airways are normal. Upper Abdomen: No acute findings. Calcified plaque over the abdominal aorta. Musculoskeletal: Degenerative change of the spine. IMPRESSION: Central left lung mass in the suprahilar region extending into the AP window measuring approximately 3.4 x 6 cm likely larger compared to the previous exams. Found to be hypermetabolic on previous PET-CT and likely represents bronchogenic carcinoma. Adjacent mediastinal adenopathy as described. Several small pulmonary nodules bilaterally which may be due to metastatic disease although could be seen with atypical infectious or inflammatory processes. Recommend attention on follow-up. Aneurysmal dilatation of the ascending thoracic aorta measuring 4.8 cm in AP diameter without significant change. Aneurysmal dilatation of the descending thoracic aorta measuring 4.8 cm in AP diameter. Ascending thoracic aortic aneurysm. Recommend  semi-annual imaging followup by CTA or MRA and referral to cardiothoracic surgery if not already obtained. This recommendation follows 2010 ACCF/AHA/AATS/ACR/ASA/SCA/SCAI/SIR/STS/SVM Guidelines for the Diagnosis and Management of Patients With Thoracic Aortic Disease. Circulation. 2010; 121: G387-F643. Aortic Atherosclerosis (ICD10-I70.0). Left main and 3 vessel atherosclerotic coronary artery disease. Mild cardiomegaly. Electronically Signed   By: Marin Olp M.D.   On: 10/08/2018 20:02    ____________________________________________   PROCEDURES  Procedure(s) performed:   Procedures  Critical Care performed:   ____________________________________________   INITIAL IMPRESSION / ASSESSMENT AND PLAN / ED COURSE  CT scan of the chest looks like bronchogenic carcinoma getting worse.  There is no pneumonia.  Patient has not had any diarrhea since he is been here.  Patient has no obvious etiology for his fever which has been low-grade at home.  I will let him go home he will follow-up with his doctor.  He will return if worse.  I tried to convince him to stay in the hospital but he did not want to.  Is been saying he wanted to go home whole time he is been here.         ____________________________________________   FINAL CLINICAL IMPRESSION(S) / ED DIAGNOSES  Final diagnoses:  Malignant neoplasm of upper lobe of left lung [facility])     ED Discharge Orders    None       Note:  This document was prepared using Dragon voice recognition software and may include unintentional dictation errors.    Nena Polio, MD 10/08/18 [phone]

## 2018-10-08 NOTE — Discharge Instructions (Addendum)
Please follow-up with Dr. you the oncologist.  Please also follow-up with your regular doctor.  I would try to see them later this week or early next week.  The CAT scan shows that your aneurysm has not changed but you have a bronchogenic carcinoma.  We can start trying to treat this if you would wish.  Please return here for worse fever, vomiting , doing sicker or any other problems.

## 2018-10-08 NOTE — ED Notes (Addendum)
Pt agitated and not wanting to answer questions at this time. Pt given urinal and instructed to provide urine sample and hit call light for someone to collect urine sample when able to provide one

## 2018-10-08 NOTE — ED Triage Notes (Addendum)
Pt to ED via POV with family w/ multiple complaints , states increased weakness and fever xfew days. Pt c/o frequent urination and headache. Per family pt increased falls over the past few days, denies head injuries. PT takes elequis .  VSS. PT A&OX4 at baseline

## 2018-10-08 NOTE — ED Notes (Signed)
Spoke with MD Paduchowski about pt presentation, verbal orders given for CT head and troponin add on at this time

## 2018-10-08 NOTE — ED Notes (Signed)
Pt currently taken to xray

## 2018-10-08 NOTE — ED Notes (Signed)
Attempted to collect 2 cultures. Pt has iv in place. Second iv and cultures collected from site. Straight stick collection for cultures attempted. Pt refusing to be stuck again to collect second set of cultures

## 2018-10-13 LAB — CULTURE, BLOOD (ROUTINE X 2)
Culture: NO GROWTH
Special Requests: ADEQUATE

## 2018-10-14 ENCOUNTER — Encounter: Payer: Self-pay | Admitting: Oncology

## 2018-10-14 ENCOUNTER — Other Ambulatory Visit: Payer: Self-pay

## 2018-10-14 ENCOUNTER — Encounter: Payer: Self-pay | Admitting: *Deleted

## 2018-10-14 ENCOUNTER — Inpatient Hospital Stay: Payer: Medicare Other | Attending: Oncology | Admitting: Oncology

## 2018-10-14 VITALS — BP 116/55 | HR 80 | Temp 96.7°F | Resp 18 | Ht 72.0 in | Wt 132.6 lb

## 2018-10-14 DIAGNOSIS — Z7901 Long term (current) use of anticoagulants: Secondary | ICD-10-CM | POA: Insufficient documentation

## 2018-10-14 DIAGNOSIS — Z8673 Personal history of transient ischemic attack (TIA), and cerebral infarction without residual deficits: Secondary | ICD-10-CM | POA: Diagnosis not present

## 2018-10-14 DIAGNOSIS — I4891 Unspecified atrial fibrillation: Secondary | ICD-10-CM | POA: Diagnosis not present

## 2018-10-14 DIAGNOSIS — R590 Localized enlarged lymph nodes: Secondary | ICD-10-CM

## 2018-10-14 DIAGNOSIS — I5022 Chronic systolic (congestive) heart failure: Secondary | ICD-10-CM

## 2018-10-14 DIAGNOSIS — Z79899 Other long term (current) drug therapy: Secondary | ICD-10-CM | POA: Diagnosis not present

## 2018-10-14 DIAGNOSIS — Z87891 Personal history of nicotine dependence: Secondary | ICD-10-CM | POA: Insufficient documentation

## 2018-10-14 DIAGNOSIS — R918 Other nonspecific abnormal finding of lung field: Secondary | ICD-10-CM | POA: Insufficient documentation

## 2018-10-14 NOTE — Progress Notes (Signed)
Patient here for initial visit. Wife present with patient.

## 2018-10-14 NOTE — Progress Notes (Signed)
  Oncology Nurse Navigator Documentation  Navigator Location: [facility]-Med Onc (10/14/18 1400) Referral date to RadOnc/MedOnc: 10/10/18 (10/14/18 1400) )Navigator Encounter Type: Initial MedOnc (10/14/18 1400)   Abnormal Finding Date: 10/08/18 (10/14/18 1400)                   Treatment Phase: Abnormal Scans (10/14/18 1400) Barriers/Navigation Needs: Coordination of Care (10/14/18 1400)   Interventions: Coordination of Care (10/14/18 1400)   Coordination of Care: Appts;Radiology (10/14/18 1400)        Acuity: Level 2 (10/14/18 1400)   Acuity Level 2: Initial guidance, education and coordination as needed;Educational needs;Assistance expediting appointments (10/14/18 1400)  met with patient and his family during initial med-onc consultation with Dr. Tasia Catchings to discuss recent CT findings. All questions answered during visit. Reviewed upcoming appts with patient and his wife regarding PET scan. Prep instructions given. Pt informed that we will be in contact with him after his case is discussed at tumor board on 11/14. Contact info given and instructed to call with any further questions or concerns. Pt and family verbalized understanding.   Time Spent with Patient: 45 (10/14/18 1400)

## 2018-10-16 NOTE — Progress Notes (Signed)
Hematology/Oncology Consult note [facility] Telephone:([phone] [phone]   Patient Care Team: Alwyn Pea, NP as PCP - General (Family Medicine) Telford Nab, RN as Registered Nurse  REFERRING PROVIDER: Dr.Malinda  CHIEF COMPLAINTS/REASON FOR VISIT:  Evaluation of lung mass  HISTORY OF PRESENTING ILLNESS:  Patrick Harding is a  84 y.o.  male with PMH listed below who was referred to me for evaluation of lung mass.   Patient recently presented emergency room for evaluation of fever of 101, nonproductive cough and profound weakness. CT done in the emergency room 10/08/2018 showed central left lung mass in the suprahilar region extending into AP window measuring approximately 3 x 4 x 6 cm, larger compared to previous exams.   Patient has been seen by Dr. Felicie Morn previously for lung mass. Patient's previous image work-up includes PET scan done 06/18/2017 which showed hypermetabolic right hilar mass or indeterminate etiology.  Favor bronchogenic carcinoma or lymphoma.  Mild metabolic activity of mediastinal lymph nodes is indeterminate.  Resolution of right middle lobe nodularity suggest infectious or inflammatory process.  Hypermetabolic asymmetric endplate lytic lesion at S1 with metabolic activity appear.  Favor benign process but cannot exclude solitary metastasis. Patient was seen and evaluated by Dr. Felicie Morn and had 01/28/2018 EBUS guided needle biopsy taking and several lymph node stations.  Patient also had a left upper lobe transbronchial cytology brushing, washing/BAL, removal of mucous plug from both lungs. Biopsy pathology was negative.  Per Dr. Ashby Dawes, patient's case was discussed on tumor board. Consensus reached to repeat a PET scan in 3 to 4 months for reevaluation and possible rebiopsy.  Patient had called office consult his follow-up appointments for PET scan and had not seen Dr. Felicie Morn since then.  #Today patient was  accompanied by wife and daughter to clinic to discuss CT scan and management plan. Reports feeling weak and fatigued.  Appetite has decreased.  His weight appears stable since February this year. Lives at home with wife. Mild shortness of breath with exertion.  Per family pack, patient does not walk much.  History of stroke in 2017, residual right sided weakness.  History of chronic systolic CHF, Neihart class III, global LVEF 25% [12/08/2015 2D echo] On Eliquis 2.5 twice daily for anticoagulation for risk reduction of stroke with atrial fibrillation/flutter.   Review of Systems  Constitutional: Positive for malaise/fatigue. Negative for chills, fever and weight loss.  HENT: Negative for nosebleeds and sore throat.   Eyes: Negative for double vision and redness.  Respiratory: Positive for shortness of breath. Negative for cough and wheezing.   Cardiovascular: Negative for chest pain, palpitations and orthopnea.  Gastrointestinal: Negative for abdominal pain, blood in stool, nausea and vomiting.  Genitourinary: Negative for dysuria and frequency.  Musculoskeletal: Negative for myalgias.  Skin: Negative for itching and rash.  Neurological: Positive for weakness. Negative for dizziness, tingling and tremors.  Endo/Heme/Allergies: Negative for environmental allergies. Does not bruise/bleed easily.  Psychiatric/Behavioral: Negative for hallucinations.    MEDICAL HISTORY:  Past Medical History:  Diagnosis Date  . Anemia   . Aortic aneurysm without rupture ([place]) 12/26/2015  . Arrhythmia   . Atrial fibrillation ([place])   . Benign neoplasm of cecum   . Benign neoplasm of transverse colon   . Blood in stool   . Cachexia ([place]) 05/11/2017  . CAP (community acquired pneumonia) 03/20/2016  . Chronic systolic CHF (congestive heart failure), NYHA class 3 ([place]) 12/26/2015   Overview:  Global ef 25%  . COPD (chronic obstructive pulmonary  disease) ([place])   . Dysarthria due to recent stroke 03/12/2016  .  Dyspnea on exertion 01/03/2016  . Dysrhythmia    atrial fib.  . Erectile dysfunction   . Essential hypertension 04/14/2015  . Gastritis   . Generalized weakness 05/11/2017  . Hyperlipidemia   . Hypertension   . Left elbow pain 05/11/2017  . Lower extremity weakness (Right) 01/24/2016  . 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 Lumbar facet syndrome 08/10/2015  . Lumbar foraminal stenosis (Left Moderate-severe L4-5) (Bilateral Moderate L5-S1) 03/12/2016  . Nonrheumatic aortic valve insufficiency 01/03/2016  . Panlobular emphysema ([place]) 01/03/2016  . Persistent atrial fibrillation 04/14/2015   Overview:  Last Assessment & Plan:  His ventricular rate continues to be elevated. Thus, I elected to increase the dose of metoprolol to 50 mg twice daily. Continue anticoagulation. He is complaining of increased dyspnea but his oxygen saturation is 95% on room air and his lungs are relatively clear but he does have diminished breath sounds. EKG does not show any new changes. Once his heart rate i  . Pressure ulcer 03/21/2016  . Rectal polyp   . Reflux esophagitis   . Renal insufficiency   . Sacroiliac joint dysfunction 08/10/2015  . Stroke determined by clinical assessment ([place]) 03/12/2016  . Thoracic ascending aortic aneurysm ([place]) 01/11/2016   Overview:  4.8cm 12/2015  . Vitamin D deficiency   . Weakness of both legs 12/26/2015    SURGICAL HISTORY: Past Surgical History:  Procedure Laterality Date  . BACK SURGERY     lumbar  . COLONOSCOPY WITH PROPOFOL N/A 07/17/2016   Procedure: COLONOSCOPY WITH PROPOFOL;  Surgeon: [name], MD;  Location: [facility] ENDOSCOPY;  Service: Endoscopy;  Laterality: N/A;  . ENDOBRONCHIAL ULTRASOUND N/A 01/27/2018   Procedure: ENDOBRONCHIAL ULTRASOUND;  Surgeon: [name], MD;  Location: [facility] ORS;  Service: Pulmonary;  Laterality: N/A;  . ESOPHAGOGASTRODUODENOSCOPY (EGD) WITH PROPOFOL N/A 07/17/2016   Procedure: ESOPHAGOGASTRODUODENOSCOPY (EGD) WITH PROPOFOL;  Surgeon: [name],  MD;  Location: [facility] ENDOSCOPY;  Service: Endoscopy;  Laterality: N/A;  . EYE SURGERY     cataract right  . FACIAL FRACTURE SURGERY    . Head surgery Left   . TONSILLECTOMY      SOCIAL HISTORY: Social History   Socioeconomic History  . Marital status: Married    Spouse name: Not on file  . Number of children: Not on file  . Years of education: Not on file  . Highest education level: Not on file  Occupational History  . Not on file  Social Needs  . Financial resource strain: Not on file  . Food insecurity:    Worry: Not on file    Inability: Not on file  . Transportation needs:    Medical: Not on file    Non-medical: Not on file  Tobacco Use  . Smoking status: Former Smoker    Packs/day: 0.50    Years: 20.00    Pack years: 10.00    Types: Cigarettes    Last attempt to quit: 10/24/2015    Years since quitting: 2.9  . Smokeless tobacco: Never Used  Substance and Sexual Activity  . Alcohol use: No    Alcohol/week: 0.0 standard drinks    Comment: quit smoking about 4 years ago  . Drug use: No  . Sexual activity: Not on file  Lifestyle  . Physical activity:    Days per week: Not on file    Minutes per session: Not on file  . Stress: Not on file  Relationships  . Social connections:    Talks on phone: Not on file    Gets together: Not on file    Attends religious service: Not on file    Active member of club or organization: Not on file    Attends meetings of clubs or organizations: Not on file    Relationship status: Not on file  . Intimate partner violence:    Fear of current or ex partner: Not on file    Emotionally abused: Not on file    Physically abused: Not on file    Forced sexual activity: Not on file  Other Topics Concern  . Not on file  Social History Narrative  . Not on file    FAMILY HISTORY: Family History  Problem Relation Age of Onset  . Heart failure Mother   . Arthritis Father   . Bladder Cancer Neg Hx   . Kidney cancer Neg Hx   .  Prolactinoma Neg Hx   . Prostate cancer Neg Hx     ALLERGIES:  is allergic to aspirin and penicillins.  MEDICATIONS:  Current Outpatient Medications  Medication Sig Dispense Refill  . acetaminophen (TYLENOL) 500 MG tablet Take 1,000 mg by mouth every 6 (six) hours as needed for mild pain or headache.    . AMBULATORY NON FORMULARY MEDICATION Medication Name: nebulizer and supplies DX:J43.1 1 each 0  . apixaban (ELIQUIS) 2.5 MG TABS tablet Take 1.25 mg by mouth 2 (two) times daily.     . clotrimazole-betamethasone (LOTRISONE) cream Reported on 06/07/2016  0  . cyclobenzaprine (FLEXERIL) 5 MG tablet Take 1 tablet (5 mg total) by mouth 3 (three) times daily as needed for muscle spasms. 15 tablet 0  . diltiazem (CARDIZEM CD) 180 MG 24 hr capsule Take 180 mg by mouth daily.    . ferrous fumarate (HEMOCYTE - 106 MG FE) 325 (106 Fe) MG TABS tablet Take 325 mg by mouth daily.    Marland Kitchen ipratropium-albuterol (DUONEB) 0.5-2.5 (3) MG/3ML SOLN Take 3 mLs by nebulization every 8 (eight) hours. And prn 360 mL 11  . metoprolol (LOPRESSOR) 50 MG tablet Take 1 tablet (50 mg total) by mouth 2 (two) times daily. 60 tablet 2  . Multiple Vitamin (MULTI-VITAMINS) TABS Take by mouth.    . pantoprazole (PROTONIX) 40 MG tablet Reported on 06/07/2016  0  . tiotropium (SPIRIVA) 18 MCG inhalation capsule Place 1 capsule (18 mcg total) into inhaler and inhale daily. 30 capsule 6  . omega-3 acid ethyl esters (LOVAZA) 1 g capsule Take 1 g by mouth daily. Reported on 06/07/2016     No current facility-administered medications for this visit.      PHYSICAL EXAMINATION: ECOG PERFORMANCE STATUS: 3 - Symptomatic, >50% confined to bed Vitals:   10/14/18 1254  BP: (!) 116/55  Pulse: 80  Resp: 18  Temp: (!) 96.7 F (35.9 C)  SpO2: 100%   Filed Weights   10/14/18 1254  Weight: 132 lb 9.6 oz (60.1 kg)    Physical Exam  Constitutional: He is oriented to person, place, and time. No distress.  Thin elderly male, chronic  ill appearance, sitting in wheelchair.  HENT:  Head: Normocephalic and atraumatic.  Eyes: Pupils are equal, round, and reactive to light. EOM are normal. No scleral icterus.  Neck: Normal range of motion. Neck supple.  Cardiovascular: Normal rate, regular rhythm and normal heart sounds.  Pulmonary/Chest: Effort normal. No respiratory distress.  Abdominal: Soft. Bowel sounds are normal. He exhibits no distension. There  is no tenderness.  Musculoskeletal: Normal range of motion. He exhibits no edema or deformity.  Neurological: He is alert and oriented to person, place, and time. No cranial nerve deficit.  Skin: Skin is warm and dry. No erythema.  Psychiatric: He has a normal mood and affect. His behavior is normal. Thought content normal.     LABORATORY DATA:  I have reviewed the data as listed Lab Results  Component Value Date   WBC 13.4 (H) 10/08/2018   HGB 10.5 (L) 10/08/2018   HCT 33.3 (L) 10/08/2018   MCV 87.9 10/08/2018   PLT 249 10/08/2018   Recent Labs    04/18/18 1801 10/08/18 1553  NA 137 136  K 4.1 4.6  CL 105 103  CO2 26 26  GLUCOSE 111* 102*  BUN 28* 25*  CREATININE 1.41* 1.74*  CALCIUM 8.9 8.5*  GFRNONAA 45* 35*  GFRAA 52* 40*  PROT  --  7.6  ALBUMIN  --  3.1*  AST  --  23  ALT  --  18  ALKPHOS  --  96  BILITOT  --  0.9  BILIDIR  --  0.3*  IBILI  --  0.6   Iron/TIBC/Ferritin/ %Sat No results found for: IRON, TIBC, FERRITIN, IRONPCTSAT    RADIOGRAPHIC STUDIES: I have personally reviewed the radiological images as listed and agreed with the findings in the report. # 10/08/2018 CT chest w contrast Central left lung mass in the suprahilar region extending into the AP window measuring approximately 3.4 x 6 cm likely larger compared to the previous exams. Found to be hypermetabolic on previous PET-CT and likely represents bronchogenic carcinoma. Adjacent mediastinal adenopathy as described. Several small pulmonary nodules bilaterally which may be due to  metastatic disease although could be seen with atypical infectious or inflammatory processes. Recommend attention on follow-up.  Aneurysmal dilatation of the ascending thoracic aorta measuring 4.8 cm in AP diameter without significant change. Aneurysmal dilatation of the descending thoracic aorta measuring 4.8 cm in AP diameter. Ascending thoracic aortic aneurysm. Recommend semi-annual imaging followup by CTA or MRA and referral to cardiothoracic surgery if not already obtained. This recommendation follows 2010 ACCF/AHA/AATS/ACR/ASA/SCA/SCAI/SIR/STS/SVM Guidelines for the Diagnosis and Management of Patients With Thoracic Aortic Disease. Circulation. 2010; 121: P950-D326.  ASSESSMENT & PLAN:  1. Lung mass   2. Mediastinal lymphadenopathy   3. Former smoker   4. Chronic systolic congestive heart failure, NYHA class 3 ([place])    I independently reviewed patient's CT images and PET scan.  Findings discussed with patient's, and his family members. Highly concerning for lung cancer.  Recommend obtaining PET scan for further evaluation and deciding biopsy sites. Patient informs me that the reason he canceled PET scan was because he did not want to have a repeat bronchoscopy. I asked patient if he would like to proceed any work-up at this point or if he decides to do nothing. With his multiple comorbidity, performance status and his age, he is not a good candidate for aggressive chemotherapy treatment. He may try immunotherapy if meeting criteria. Patient is interested proceed diagnostic testing and see if any peripheral sites can be feasible for biopsy He had CT head without contrast done on 10/08/2018 which showed no acute intracranial pathology.  I hold MRI brain for now in case patient decided not to proceed aggressive testing and treatments..  Orders Placed This Encounter  Procedures  . NM PET Image Restag (PS) Skull Base To Thigh    Standing Status:   Future  Standing Expiration Date:    10/14/2019    Order Specific Question:   ** REASON FOR EXAM (FREE TEXT)    Answer:   lung mass,    Order Specific Question:   If indicated for the ordered procedure, I authorize the administration of a radiopharmaceutical per Radiology protocol    Answer:   Yes    Order Specific Question:   Preferred imaging location?    Answer:   [facility]    Order Specific Question:   Radiology Contrast Protocol - do NOT remove file path    Answer:   [email or web address]    Patient family has a lot of questions.  We spent sufficient time to discuss many aspect of care, questions were answered to patient's satisfaction. The patient knows to call the clinic with any problems questions or concerns.  Return of visit: To be determined after PET scan. Thank you for this kind referral and the opportunity to participate in the care of this patient. A copy of today's note is routed to referring provider  Total face to face encounter time for this patient visit was 60 min. >50% of the time was  spent in counseling and coordination of care.    Earlie Server, MD, PhD Hematology Oncology [facility] at [facility] Pager- [phone] 10/16/2018

## 2018-10-21 ENCOUNTER — Ambulatory Visit
Admission: RE | Admit: 2018-10-21 | Discharge: 2018-10-21 | Disposition: A | Discharge status: Home / Self Care | Payer: Medicare Other | Source: Ambulatory Visit | Attending: Oncology | Admitting: Oncology

## 2018-10-21 DIAGNOSIS — R918 Other nonspecific abnormal finding of lung field: Secondary | ICD-10-CM | POA: Diagnosis not present

## 2018-10-21 DIAGNOSIS — R590 Localized enlarged lymph nodes: Secondary | ICD-10-CM | POA: Insufficient documentation

## 2018-10-21 DIAGNOSIS — I700 Atherosclerosis of aorta: Secondary | ICD-10-CM | POA: Diagnosis not present

## 2018-10-21 DIAGNOSIS — K4090 Unilateral inguinal hernia, without obstruction or gangrene, not specified as recurrent: Secondary | ICD-10-CM | POA: Insufficient documentation

## 2018-10-21 LAB — GLUCOSE, CAPILLARY: Glucose-Capillary: 71 mg/dL (ref 70–99)

## 2018-10-21 MED ORDER — FLUDEOXYGLUCOSE F - 18 (FDG) INJECTION
6.7900 | Freq: Once | INTRAVENOUS | Status: AC | PRN
  Administered 2018-10-21: 6.79 via INTRAVENOUS

## 2018-10-23 ENCOUNTER — Ambulatory Visit: Payer: Medicare Other | Admitting: Internal Medicine

## 2018-10-23 ENCOUNTER — Other Ambulatory Visit: Payer: Medicare Other

## 2018-10-23 ENCOUNTER — Telehealth: Payer: Self-pay | Admitting: *Deleted

## 2018-10-23 NOTE — Progress Notes (Signed)
Tumor Board Documentation  Delshon Blanchfield was presented by Dr Tasia Catchings at our Tumor Board on 10/23/2018, which included representatives from medical oncology, radiation oncology, surgical, radiology, pathology, navigation, internal medicine, pharmacy, research, palliative care, pulmonology.  Deddrick currently presents as a new patient with history of the following treatments: none.  Additionally, we reviewed previous medical and familial history, history of present illness, and recent lab results along with all available histopathologic and imaging studies. The tumor board considered available treatment options and made the following recommendations: Immunotherapy, Radiation therapy (primary modality), Biopsy Refer to Pulmonology for possible biopsy, possible XRT and or Immunotherapy  The following procedures/referrals were also placed: No orders of the defined types were placed in this encounter.   Clinical Trial Status: not discussed   Staging used:    National site-specific guidelines   were discussed with respect to the case.  Tumor board is a meeting of clinicians from various specialty areas who evaluate and discuss patients for whom a multidisciplinary approach is being considered. Final determinations in the plan of care are those of the provider(s). The responsibility for follow up of recommendations given during tumor board is that of the provider.   Today's extended care, comprehensive team conference, Mario was not present for the discussion and was not examined.   Multidisciplinary Tumor Board is a multidisciplinary case peer review process.  Decisions discussed in the Multidisciplinary Tumor Board reflect the opinions of the specialists present at the conference without having examined the patient.  Ultimately, treatment and diagnostic decisions rest with the primary provider(s) and the patient.

## 2018-10-23 NOTE — Telephone Encounter (Signed)
S/O called asking for "results of pictures from procedure on the 12 th" Patient is for Tumor Board discussion

## 2018-10-24 NOTE — Telephone Encounter (Signed)
Per Dr Tasia Catchings, pt's navigator Wynelle Link has taken care of this patient

## 2018-10-26 NOTE — H&P (View-Only) (Signed)
[facility]     Assessment and Plan:  Increasing size of left hilar lymphadenopathy as well as right hilar lymphadenopathy.  Suspicious for lung cancer. - We will plan for EBUS bronchoscopy.  Patient currently on Eliquis asked to stop Eliquis 3 days before procedure.  COPD chronic bronchitis. -Continue current inhalers.   Return in about 1 week (around 11/03/2018) for for bronchoscopy follow up. .   Date: 10/26/2018  MRN# [id number] Patrick Harding 12/23/1933   Patrick Harding is a 84 y.o. old male seen in consultation for chief complaint of:    Chief Complaint  Patient presents with  . COPD    pt c/o cough and wheezing. He states frequent cough non productive.    HPI:  The patient is an 84 yo male previously seen due to chronic cough. CT imaging revealed left hilar lymphadenopathy, however EBUS was non-diagnostic. He was referred for follow-up PET scan, and asked to start on treatment for COPD medications.  At that time he did not want further follow-up felt that he would not want treatment for cancer or inhalers. Today he presents after a followup CT and PET revealed progressively increasing size of the mass with increasing PET avidity strongly suggestive of malignancy.   Imaging personally reviewed, CT chest/PET scan performed on 10/08/18 and 10/21/18 in comparison with previous from 06/18/17. There is significant advancement of previously seen left hilar lymphadenopathy and right hilar lymphadenopathy. The area of hypermetabolism has significantly increased.    SPIROMETRY: FVC was 2.59 liters, 93% of predicted FEV1 was 1.73, 82% of predicted FEV1 ratio was 67 FEF 25-75% liters per second was 40% of predicted   LUNG VOLUMES: TLC was 77% of predicted RV was 71% of predicted   DIFFUSION CAPACITY: DLCO was 55% of predicted DLCO/VA was 69% of predicted   FLOW VOLUME LOOP: Expiratory flow volume loop is somewhat delayed  Impression spirometry showed mild  obstruction TC is slightly decreased DLCO is moderately decreased        Allergies:  Aspirin and Penicillins  Review of Systems:  Constitutional: Feels well. Cardiovascular: Denies chest pain, exertional chest pain.  Pulmonary: Denies hemoptysis, pleuritic chest pain.   The remainder of systems were reviewed and were found to be negative other than what is documented in the HPI.   Physical Examination:   VS: BP 140/62 (BP Location: Left Arm, Cuff Size: Normal)   Pulse 86   Resp 16   Ht 6' (1.829 m)   SpO2 98%   BMI 17.98 kg/m   General Appearance: No distress  Neuro:without focal findings, mental status, speech normal, alert and oriented HEENT: PERRLA, EOM intact Pulmonary: No wheezing, No rales  CardiovascularNormal S1,S2.  No m/r/g.  Abdomen: Benign, Soft, non-tender, No masses Renal:  No costovertebral tenderness  GU:  No performed at this time. Endoc: No evident thyromegaly, no signs of acromegaly or Cushing features Skin:   warm, no rashes, no ecchymosis  Extremities: normal, no cyanosis, clubbing.      LABORATORY PANEL:   CBC No results for input(s): WBC, HGB, HCT, PLT in the last 168 hours. ------------------------------------------------------------------------------------------------------------------  Chemistries  No results for input(s): NA, K, CL, CO2, GLUCOSE, BUN, CREATININE, CALCIUM, MG, AST, ALT, ALKPHOS, BILITOT in the last 168 hours.  Invalid input(s): GFRCGP ------------------------------------------------------------------------------------------------------------------  Cardiac Enzymes No results for input(s): TROPONINI in the last 168 hours. ------------------------------------------------------------  RADIOLOGY:  No results found.     Thank  you for the consultation and for allowing [facility],  Critical Care to assist in the care of your patient. Our recommendations are noted above.  Please contact us if we can be of  further service.  Marda Stalker, M.D., F.C.C.P.  Board Certified in Internal Medicine, Pulmonary Medicine, [place], and Sleep Medicine.  [facility] Office Number: [phone]   10/26/2018

## 2018-10-26 NOTE — Progress Notes (Signed)
[facility]     Assessment and Plan:  Increasing size of left hilar lymphadenopathy as well as right hilar lymphadenopathy.  Suspicious for lung cancer. - We will plan for EBUS bronchoscopy.  Patient currently on Eliquis asked to stop Eliquis 3 days before procedure.  COPD chronic bronchitis. -Continue current inhalers.   Return in about 1 week (around 11/03/2018) for for bronchoscopy follow up. .   Date: 10/26/2018  MRN# [id number] Izzy [place]ville 07/15/1934   Carolyn Maniscalco is a 84 y.o. old male seen in consultation for chief complaint of:    Chief Complaint  Patient presents with  . COPD    pt c/o cough and wheezing. He states frequent cough non productive.    HPI:  The patient is an 84 yo male previously seen due to chronic cough. CT imaging revealed left hilar lymphadenopathy, however EBUS was non-diagnostic. He was referred for follow-up PET scan, and asked to start on treatment for COPD medications.  At that time he did not want further follow-up felt that he would not want treatment for cancer or inhalers. Today he presents after a followup CT and PET revealed progressively increasing size of the mass with increasing PET avidity strongly suggestive of malignancy.   Imaging personally reviewed, CT chest/PET scan performed on 10/08/18 and 10/21/18 in comparison with previous from 06/18/17. There is significant advancement of previously seen left hilar lymphadenopathy and right hilar lymphadenopathy. The area of hypermetabolism has significantly increased.    SPIROMETRY: FVC was 2.59 liters, 93% of predicted FEV1 was 1.73, 82% of predicted FEV1 ratio was 67 FEF 25-75% liters per second was 40% of predicted   LUNG VOLUMES: TLC was 77% of predicted RV was 71% of predicted   DIFFUSION CAPACITY: DLCO was 55% of predicted DLCO/VA was 69% of predicted   FLOW VOLUME LOOP: Expiratory flow volume loop is somewhat delayed  Impression spirometry showed mild  obstruction TC is slightly decreased DLCO is moderately decreased        Allergies:  Aspirin and Penicillins  Review of Systems:  Constitutional: Feels well. Cardiovascular: Denies chest pain, exertional chest pain.  Pulmonary: Denies hemoptysis, pleuritic chest pain.   The remainder of systems were reviewed and were found to be negative other than what is documented in the HPI.   Physical Examination:   VS: BP 140/62 (BP Location: Left Arm, Cuff Size: Normal)   Pulse 86   Resp 16   Ht 6' (1.829 m)   SpO2 98%   BMI 17.98 kg/m   General Appearance: No distress  Neuro:without focal findings, mental status, speech normal, alert and oriented HEENT: PERRLA, EOM intact Pulmonary: No wheezing, No rales  CardiovascularNormal S1,S2.  No m/r/g.  Abdomen: Benign, Soft, non-tender, No masses Renal:  No costovertebral tenderness  GU:  No performed at this time. Endoc: No evident thyromegaly, no signs of acromegaly or Cushing features Skin:   warm, no rashes, no ecchymosis  Extremities: normal, no cyanosis, clubbing.      LABORATORY PANEL:   CBC No results for input(s): WBC, HGB, HCT, PLT in the last 168 hours. ------------------------------------------------------------------------------------------------------------------  Chemistries  No results for input(s): NA, K, CL, CO2, GLUCOSE, BUN, CREATININE, CALCIUM, MG, AST, ALT, ALKPHOS, BILITOT in the last 168 hours.  Invalid input(s): GFRCGP ------------------------------------------------------------------------------------------------------------------  Cardiac Enzymes No results for input(s): TROPONINI in the last 168 hours. ------------------------------------------------------------  RADIOLOGY:  No results found.     Thank  you for the consultation and for allowing [facility] to assist in the care of your patient. Our recommendations are noted above.  Please contact us if we can be of  further service.  Marda Stalker, M.D., F.C.C.P.  Board Certified in Internal Medicine, Pulmonary Medicine, [place], and Sleep Medicine.  [facility] Office Number: [phone]   10/26/2018

## 2018-10-27 ENCOUNTER — Telehealth: Payer: Self-pay | Admitting: *Deleted

## 2018-10-27 ENCOUNTER — Ambulatory Visit (INDEPENDENT_AMBULATORY_CARE_PROVIDER_SITE_OTHER): Payer: Medicare Other | Admitting: Internal Medicine

## 2018-10-27 ENCOUNTER — Encounter: Payer: Self-pay | Admitting: Internal Medicine

## 2018-10-27 ENCOUNTER — Other Ambulatory Visit: Payer: Self-pay | Admitting: Internal Medicine

## 2018-10-27 VITALS — BP 140/62 | HR 86 | Resp 16 | Ht 72.0 in

## 2018-10-27 DIAGNOSIS — R590 Localized enlarged lymph nodes: Secondary | ICD-10-CM | POA: Diagnosis not present

## 2018-10-27 NOTE — Telephone Encounter (Signed)
Called patient and spoke with spouse. Made aware pre assessment appt 11/21 @11 :45. She states she has a hair appt that day and will need to reschedule. She was given # to call and see if they have another opening. [phone]  New date 11/22 @8 :45.

## 2018-10-27 NOTE — Telephone Encounter (Signed)
Dr. Ashby Dawes EBUS [phone]    [phone] 11/04/18 DX: Lung nodule

## 2018-10-27 NOTE — Patient Instructions (Addendum)
Will plan for bronchoscopy biopsy next week.   Stop Eliquis 3 days before procedure.

## 2018-10-28 NOTE — Telephone Encounter (Signed)
10/28/18 at 9:06 am EST spoke with Elisabeth Most at [facility]. Pt is effective with [employer]. Procedure code [id number] and [phone] are valid and billable codes which does not require PA. Call Ref # 10/28/2018@9 :[email or web address]. Rhonda J Cobb

## 2018-10-30 ENCOUNTER — Inpatient Hospital Stay: Admission: RE | Admit: 2018-10-30 | Payer: Medicare Other | Source: Ambulatory Visit

## 2018-10-30 ENCOUNTER — Encounter: Payer: Self-pay | Admitting: Oncology

## 2018-10-30 DIAGNOSIS — Z7189 Other specified counseling: Secondary | ICD-10-CM | POA: Insufficient documentation

## 2018-10-31 ENCOUNTER — Encounter
Admission: RE | Admit: 2018-10-31 | Discharge: 2018-10-31 | Disposition: A | Discharge status: Home / Self Care | Payer: Medicare Other | Source: Ambulatory Visit | Attending: Internal Medicine | Admitting: Internal Medicine

## 2018-10-31 ENCOUNTER — Encounter: Payer: Self-pay | Admitting: Emergency Medicine

## 2018-10-31 ENCOUNTER — Emergency Department
Admission: EM | Admit: 2018-10-31 | Discharge: 2018-10-31 | Disposition: A | Discharge status: Home / Self Care | Payer: Medicare Other | Attending: Emergency Medicine | Admitting: Emergency Medicine

## 2018-10-31 ENCOUNTER — Emergency Department: Payer: Medicare Other

## 2018-10-31 ENCOUNTER — Telehealth: Payer: Self-pay

## 2018-10-31 ENCOUNTER — Other Ambulatory Visit: Payer: Self-pay

## 2018-10-31 DIAGNOSIS — I5022 Chronic systolic (congestive) heart failure: Secondary | ICD-10-CM | POA: Diagnosis not present

## 2018-10-31 DIAGNOSIS — Z87891 Personal history of nicotine dependence: Secondary | ICD-10-CM | POA: Diagnosis not present

## 2018-10-31 DIAGNOSIS — J449 Chronic obstructive pulmonary disease, unspecified: Secondary | ICD-10-CM | POA: Insufficient documentation

## 2018-10-31 DIAGNOSIS — R531 Weakness: Secondary | ICD-10-CM | POA: Diagnosis not present

## 2018-10-31 DIAGNOSIS — I110 Hypertensive heart disease with heart failure: Secondary | ICD-10-CM | POA: Diagnosis not present

## 2018-10-31 DIAGNOSIS — R4781 Slurred speech: Secondary | ICD-10-CM | POA: Diagnosis present

## 2018-10-31 DIAGNOSIS — Z8673 Personal history of transient ischemic attack (TIA), and cerebral infarction without residual deficits: Secondary | ICD-10-CM | POA: Diagnosis not present

## 2018-10-31 LAB — COMPREHENSIVE METABOLIC PANEL
ALT: 15 U/L (ref 0–44)
AST: 16 U/L (ref 15–41)
Albumin: 2.9 g/dL — ABNORMAL LOW (ref 3.5–5.0)
Alkaline Phosphatase: 67 U/L (ref 38–126)
Anion gap: 8 (ref 5–15)
BILIRUBIN TOTAL: 0.4 mg/dL (ref 0.3–1.2)
BUN: 33 mg/dL — ABNORMAL HIGH (ref 8–23)
CO2: 26 mmol/L (ref 22–32)
CREATININE: 1.55 mg/dL — AB (ref 0.61–1.24)
Calcium: 8.4 mg/dL — ABNORMAL LOW (ref 8.9–10.3)
Chloride: 103 mmol/L (ref 98–111)
GFR, EST AFRICAN AMERICAN: 46 mL/min — AB (ref >60)
GFR, EST NON AFRICAN AMERICAN: 40 mL/min — AB (ref >60)
Glucose, Bld: 100 mg/dL — ABNORMAL HIGH (ref 70–99)
Potassium: 4.1 mmol/L (ref 3.5–5.1)
Sodium: 137 mmol/L (ref 135–145)
TOTAL PROTEIN: 6.6 g/dL (ref 6.5–8.1)

## 2018-10-31 LAB — TROPONIN I

## 2018-10-31 LAB — CBC WITH DIFFERENTIAL/PLATELET
Abs Immature Granulocytes: 0.76 10*3/uL — ABNORMAL HIGH (ref 0.00–0.07)
Basophils Absolute: 0 10*3/uL (ref 0.0–0.1)
Basophils Relative: 0 %
EOS ABS: 0.4 10*3/uL (ref 0.0–0.5)
Eosinophils Relative: 2 %
HEMATOCRIT: 32.5 % — AB (ref 39.0–52.0)
Hemoglobin: 10.3 g/dL — ABNORMAL LOW (ref 13.0–17.0)
IMMATURE GRANULOCYTES: 4 %
LYMPHS ABS: 2.4 10*3/uL (ref 0.7–4.0)
Lymphocytes Relative: 14 %
MCH: 27.8 pg (ref 26.0–34.0)
MCHC: 31.7 g/dL (ref 30.0–36.0)
MCV: 87.8 fL (ref 80.0–100.0)
MONO ABS: 2.3 10*3/uL — AB (ref 0.1–1.0)
MONOS PCT: 13 %
NEUTROS PCT: 67 %
Neutro Abs: 11.5 10*3/uL — ABNORMAL HIGH (ref 1.7–7.7)
Platelets: 313 10*3/uL (ref 150–400)
RBC: 3.7 MIL/uL — ABNORMAL LOW (ref 4.22–5.81)
RDW: 15.3 % (ref 11.5–15.5)
WBC: 17.4 10*3/uL — ABNORMAL HIGH (ref 4.0–10.5)
nRBC: 0 % (ref 0.0–0.2)

## 2018-10-31 LAB — PROTIME-INR
INR: 1.2
Prothrombin Time: 15.1 seconds (ref 11.4–15.2)

## 2018-10-31 NOTE — Patient Instructions (Signed)
Your procedure is scheduled on: Tues 11/04/18 Report to Day Surgery. To find out your arrival time please call [phone] between 1PM - 3PM on Monday 11/03/18.  Remember: Instructions that are not followed completely may result in serious medical risk,  up to and including death, or upon the discretion of your surgeon and anesthesiologist your  surgery may need to be rescheduled.     _X__ 1. Do not eat food after midnight the night before your procedure.                 No gum chewing or hard candies. You may drink clear liquids up to 2 hours                 before you are scheduled to arrive for your surgery- DO not drink clear                 liquids within 2 hours of the start of your surgery.                 Clear Liquids include:  water, apple juice without pulp, clear carbohydrate                 drink such as Clearfast of Gatorade, Black Coffee or Tea (Do not add                 anything to coffee or tea).  __X__2.  On the morning of surgery brush your teeth with toothpaste and water, you                may rinse your mouth with mouthwash if you wish.  Do not swallow any toothpaste of mouthwash.     _X__ 3.  No Alcohol for 24 hours before or after surgery.   _X__ 4.  Do Not Smoke or use e-cigarettes For 24 Hours Prior to Your Surgery.                 Do not use any chewable tobacco products for at least 6 hours prior to                 surgery.  ____  5.  Bring all medications with you on the day of surgery if instructed.   __x__  6.  Notify your doctor if there is any change in your medical condition      (cold, fever, infections).     Do not wear jewelry, make-up, hairpins, clips or nail polish. Do not wear lotions, powders, or perfumes. You may wear deodorant. Do not shave 48 hours prior to surgery. Men may shave face and neck. Do not bring valuables to the hospital.    [facility] is not responsible for any belongings or valuables.  Contacts,  dentures or bridgework may not be worn into surgery. Leave your suitcase in the car. After surgery it may be brought to your room. For patients admitted to the hospital, discharge time is determined by your treatment team.   Patients discharged the day of surgery will not be allowed to drive home.   Please read over the following fact sheets that you were given:    __x__ Take these medicines the morning of surgery with A SIP OF WATER:    1. acetaminophen (TYLENOL) 500 MG tablet  2. diltiazem (CARDIZEM CD) 180 MG 24 hr capsule  3. metoprolol (LOPRESSOR) 50 MG tablet  4.  5.  6.  ____ Fleet Enema (as directed)  ____ Use CHG Soap as directed  _x___ Use Duoneb on the day of surgery ipratropium-albuterol (DUONEB) 0.5-2.5 (3) MG/3ML SOLN  ____ Stop metformin 2 days prior to surgery    ____ Take 1/2 of usual insulin dose the night before surgery. No insulin the morning          of surgery.   __x__ Stop apixaban (ELIQUIS) 2.5 MG TABS tablet  today  ____ Stop Anti-inflammatories on   ____ Stop supplements until after surgery.    ____ Bring C-Pap to the hospital.

## 2018-10-31 NOTE — Telephone Encounter (Signed)
Patient was seen in ED and deemed appropriate to proceed with bronchoscopy.

## 2018-10-31 NOTE — Telephone Encounter (Signed)
Preadmit testing called to let us know that patient showed up for his pre-op apt this morning, but wife stated he was "not acting as himself." They took him to the emergency room and were not able to complete pre-op apt. I will make Dr. Ashby Dawes aware.

## 2018-10-31 NOTE — ED Provider Notes (Signed)
[facility] Emergency Department Provider Note       Time seen: ----------------------------------------- 9:18 AM on 10/31/2018 -----------------------------------------   I have reviewed the triage vital signs and the nursing notes.  HISTORY   Chief Complaint No chief complaint on file.    HPI Patrick Harding is a 84 y.o. male with a history of anemia, A. fib, community acquired pneumonia, CHF, COPD, paroxysmal atrial fibrillation who presents to the ED for slurred speech as well as weakness and altered mental status.  Patient was due to have bronchoscopy on Tuesday.  Patient reportedly appeared pale, wife stated he has not been feeling well since last night.  He was actually coming to the hospital today for preadmission testing.  Past Medical History:  Diagnosis Date  . Anemia   . Aortic aneurysm without rupture ([place]) 12/26/2015  . Arrhythmia   . Atrial fibrillation ([place])   . Benign neoplasm of cecum   . Benign neoplasm of transverse colon   . Blood in stool   . Cachexia ([place]) 05/11/2017  . CAP (community acquired pneumonia) 03/20/2016  . Chronic systolic CHF (congestive heart failure), NYHA class 3 ([place]) 12/26/2015   Overview:  Global ef 25%  . COPD (chronic obstructive pulmonary disease) ([place])   . Dysarthria due to recent stroke 03/12/2016  . Dyspnea on exertion 01/03/2016  . Dysrhythmia    atrial fib.  . Erectile dysfunction   . Essential hypertension 04/14/2015  . Gastritis   . Generalized weakness 05/11/2017  . Hyperlipidemia   . Hypertension   . Left elbow pain 05/11/2017  . Lower extremity weakness (Right) 01/24/2016  . 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 Lumbar facet syndrome 08/10/2015  . Lumbar foraminal stenosis (Left Moderate-severe L4-5) (Bilateral Moderate L5-S1) 03/12/2016  . Nonrheumatic aortic valve insufficiency 01/03/2016  . Panlobular emphysema ([place]) 01/03/2016  . Persistent atrial fibrillation 04/14/2015   Overview:  Last Assessment & Plan:  His  ventricular rate continues to be elevated. Thus, I elected to increase the dose of metoprolol to 50 mg twice daily. Continue anticoagulation. He is complaining of increased dyspnea but his oxygen saturation is 95% on room air and his lungs are relatively clear but he does have diminished breath sounds. EKG does not show any new changes. Once his heart rate i  . Pressure ulcer 03/21/2016  . Rectal polyp   . Reflux esophagitis   . Renal insufficiency   . Sacroiliac joint dysfunction 08/10/2015  . Stroke determined by clinical assessment ([place]) 03/12/2016  . Thoracic ascending aortic aneurysm ([place]) 01/11/2016   Overview:  4.8cm 12/2015  . Vitamin D deficiency   . Weakness of both legs 12/26/2015    Patient Active Problem List   Diagnosis Date Noted  . Goals of care, counseling/discussion 10/30/2018  . Acute pain of left shoulder 05/11/2017  . Loss of appetite 05/11/2017  . Cachexia ([place]) 05/11/2017  . Generalized weakness 05/11/2017  . Left elbow pain 05/11/2017  . Neutrophilia 05/11/2017  . Blood in stool   . Benign neoplasm of cecum   . Rectal polyp   . Benign neoplasm of transverse colon   . Reflux esophagitis   . Gastritis   . HLD (hyperlipidemia) 03/28/2016  . AKI (acute kidney injury) ([place]) 03/28/2016  . 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 Pressure ulcer 03/21/2016  . CAP (community acquired pneumonia) 03/20/2016  . Protein-calorie malnutrition, severe 03/20/2016  . Speech and language deficits 03/12/2016  . Dysarthria due to recent stroke 03/12/2016  . Stroke determined by clinical  assessment ([place]) 03/12/2016  . Lumbar foraminal stenosis (Left Moderate-severe L4-5) (Bilateral Moderate L5-S1) 03/12/2016  . Abnormal MRI, lumbar spine (2012) 01/24/2016  . Chronic low back pain (Location of Secondary source of pain) (Bilateral) (R>L) 01/24/2016  . Chronic midline back pain 01/24/2016  . Chronic lumbar radicular pain (Location of Primary Source of Pain) (Right) (L3/L4 dermatome)  01/24/2016  . Chronic pain 01/24/2016  . Chronic anticoagulation 01/24/2016  . Long-term use of high-risk medication 01/24/2016  . 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 Lower extremity weakness (Right) 01/24/2016  . Thoracic ascending aortic aneurysm ([place]) 01/11/2016  . Dyspnea on exertion 01/03/2016  . Former cigarette smoker 01/03/2016  . Nonrheumatic aortic valve insufficiency 01/03/2016  . Panlobular emphysema ([place]) 01/03/2016  . Weight loss, abnormal 12/26/2015  . Weakness of both legs 12/26/2015  . Pulmonary nodule, right 12/26/2015  . Aortic aneurysm without rupture ([place]) 12/26/2015  . Chronic systolic CHF (congestive heart failure), NYHA class 3 ([place]) 12/26/2015  . Spinal stenosis, lumbar region, with neurogenic claudication 08/10/2015  . Lumbar facet syndrome 08/10/2015  . Sacroiliac joint dysfunction 08/10/2015  . Atrial fibrillation ([place]) 04/14/2015  . Essential hypertension 04/14/2015  . Persistent atrial fibrillation 04/14/2015    Past Surgical History:  Procedure Laterality Date  . BACK SURGERY     lumbar  . COLONOSCOPY WITH PROPOFOL N/A 07/17/2016   Procedure: COLONOSCOPY WITH PROPOFOL;  Surgeon: [name], MD;  Location: [facility] ENDOSCOPY;  Service: Endoscopy;  Laterality: N/A;  . ENDOBRONCHIAL ULTRASOUND N/A 01/27/2018   Procedure: ENDOBRONCHIAL ULTRASOUND;  Surgeon: [name], MD;  Location: [facility] ORS;  Service: Pulmonary;  Laterality: N/A;  . ESOPHAGOGASTRODUODENOSCOPY (EGD) WITH PROPOFOL N/A 07/17/2016   Procedure: ESOPHAGOGASTRODUODENOSCOPY (EGD) WITH PROPOFOL;  Surgeon: [name], MD;  Location: [facility] ENDOSCOPY;  Service: Endoscopy;  Laterality: N/A;  . EYE SURGERY     cataract right  . FACIAL FRACTURE SURGERY    . Head surgery Left   . TONSILLECTOMY      Allergies Aspirin and Penicillins  Social History Social History   Tobacco Use  . Smoking status: Former Smoker    Packs/day: 0.50    Years: 20.00     Pack years: 10.00    Types: Cigarettes    Last attempt to quit: 10/24/2015    Years since quitting: 3.0  . Smokeless tobacco: Never Used  Substance Use Topics  . Alcohol use: No    Alcohol/week: 0.0 standard drinks    Comment: quit smoking about 4 years ago  . Drug use: No   Review of Systems Constitutional: Negative for fever. Cardiovascular: Negative for chest pain. Respiratory: Negative for shortness of breath. Gastrointestinal: Negative for abdominal pain, vomiting and diarrhea. Musculoskeletal: Negative for back pain. Skin: Negative for rash. Neurological: Negative for headaches, focal weakness or numbness.  Positive for slurred speech  All systems negative/normal/unremarkable except as stated in the HPI  ____________________________________________   PHYSICAL EXAM:  VITAL SIGNS: ED Triage Vitals  Enc Vitals Group     BP      Pulse      Resp      Temp      Temp src      SpO2      Weight      Height      Head Circumference      Peak Flow      Pain Score      Pain Loc      Pain Edu?  Excl. in [place]?    Constitutional: Alert and oriented. Well appearing and in no distress. Eyes: Conjunctivae are normal. Normal extraocular movements. ENT   Head: Normocephalic and atraumatic.   Nose: No congestion/rhinnorhea.   Mouth/Throat: Mucous membranes are moist.   Neck: No stridor. Cardiovascular: Normal rate, regular rhythm. No murmurs, rubs, or gallops. Respiratory: Normal respiratory effort without tachypnea nor retractions. Breath sounds are clear and equal bilaterally. No wheezes/rales/rhonchi. Gastrointestinal: Soft and nontender. Normal bowel sounds Musculoskeletal: Nontender with normal range of motion in extremities. No lower extremity tenderness nor edema. Neurologic:  Normal speech and language. No gross focal neurologic deficits are appreciated.  Skin:  Skin is warm, dry and intact. No rash noted. Psychiatric: Mood and affect are normal. Speech  and behavior are normal.  I do not appreciate any speech abnormalities ____________________________________________  EKG: Interpreted by me.  Atrial fibrillation with a rate of 81 bpm, nonspecific ST segment changes, right axis deviation, normal QT  ____________________________________________  ED COURSE:  As part of my medical decision making, I reviewed the following data within the [place] History obtained from family if available, nursing notes, old chart and ekg, as well as notes from prior ED visits. Patient presented for potential slurred speech, we will assess with labs and imaging as indicated at this time.   Procedures ____________________________________________   LABS (pertinent positives/negatives)  Labs Reviewed  CBC WITH DIFFERENTIAL/PLATELET - Abnormal; Notable for the following components:      Result Value   WBC 17.4 (*)    RBC 3.70 (*)    Hemoglobin 10.3 (*)    HCT 32.5 (*)    Neutro Abs 11.5 (*)    Monocytes Absolute 2.3 (*)    Abs Immature Granulocytes 0.76 (*)    All other components within normal limits  COMPREHENSIVE METABOLIC PANEL - Abnormal; Notable for the following components:   Glucose, Bld 100 (*)    BUN 33 (*)    Creatinine, Ser 1.55 (*)    Calcium 8.4 (*)    Albumin 2.9 (*)    GFR calc non Af Amer 40 (*)    GFR calc Af Amer 46 (*)    All other components within normal limits  TROPONIN I  PROTIME-INR  URINALYSIS, COMPLETE (UACMP) WITH MICROSCOPIC  CBG MONITORING, ED    RADIOLOGY Images were viewed by me  CT head is unremarkable IMPRESSION: Small old left frontal infarct, stable.  No acute intracranial abnormality.  Atrophy, chronic microvascular disease. IMPRESSION: Left upper lobe mass again noted.  Mild cardiomegaly. ____________________________________________  DIFFERENTIAL DIAGNOSIS   Dysarthria, aphasia, normal speech, dehydration, electrolyte abnormality, CVA  FINAL ASSESSMENT AND PLAN  Medical  screening exam   Plan: The patient had presented for possible slurred speech. Patient's labs did reveal leukocytosis that is acute on chronic.  He has been placed on prednisone for gout in his right great toe which is likely causing some of his leukocytosis.  I cannot find any acute infections at this time.  He is currently on Eliquis, I do not think he had a TIA or CVA today.  Patient's imaging was unremarkable.  He is cleared for outpatient follow-up as scheduled and bronchoscopy for biopsy.   Laurence Aly, MD   Note: This note was generated in part or whole with voice recognition software. Voice recognition is usually quite accurate but there are transcription errors that can and very often do occur. I apologize for any typographical errors that were not detected and corrected.  Earleen Newport, MD 10/31/18 1048

## 2018-10-31 NOTE — Pre-Procedure Instructions (Signed)
During preop interview patient's speech was slurred, he is complaining of severe pain in right great toe and right shoulder.  Unable to stand. Transferred to [facility] and called ER for transfer. Called Report to [facility]

## 2018-10-31 NOTE — ED Triage Notes (Signed)
Patient sent by staff at pre op. Patient's wife states patient has been having worsening weakness and slurred speech over the day or two. Wife reports patient has also had increased secretions since this morning.

## 2018-10-31 NOTE — ED Notes (Addendum)
FIRST NURSE NOTE:  Pt coming from pre-admit testing with reports of slurred speech, weakness and altered mental status.  Pt due to have bronchoscopy on Tuesday.  Pt appears pale. Wife states pt has not been feeling well since last night.

## 2018-10-31 NOTE — ED Notes (Signed)
Labs obtained and sent.

## 2018-11-03 ENCOUNTER — Telehealth: Payer: Self-pay

## 2018-11-03 NOTE — Telephone Encounter (Signed)
Please call today and make sure that he is feeling better, if not may need to postpone procedure.

## 2018-11-03 NOTE — Telephone Encounter (Signed)
Attempted to call patient to check on him for procedure tomorrow. No answer at either number.

## 2018-11-04 ENCOUNTER — Other Ambulatory Visit: Payer: Self-pay

## 2018-11-04 ENCOUNTER — Encounter
Admission: RE | Disposition: A | Discharge status: Home / Self Care | Payer: Self-pay | Source: Ambulatory Visit | Attending: Internal Medicine

## 2018-11-04 ENCOUNTER — Ambulatory Visit: Payer: Medicare Other | Admitting: Anesthesiology

## 2018-11-04 ENCOUNTER — Encounter: Payer: Self-pay | Admitting: *Deleted

## 2018-11-04 ENCOUNTER — Ambulatory Visit
Admission: RE | Admit: 2018-11-04 | Discharge: 2018-11-04 | Disposition: A | Discharge status: Home / Self Care | Payer: Medicare Other | Source: Ambulatory Visit | Attending: Internal Medicine | Admitting: Internal Medicine

## 2018-11-04 ENCOUNTER — Ambulatory Visit: Payer: Medicare Other

## 2018-11-04 DIAGNOSIS — C3402 Malignant neoplasm of left main bronchus: Secondary | ICD-10-CM | POA: Insufficient documentation

## 2018-11-04 DIAGNOSIS — Z7901 Long term (current) use of anticoagulants: Secondary | ICD-10-CM | POA: Insufficient documentation

## 2018-11-04 DIAGNOSIS — R590 Localized enlarged lymph nodes: Secondary | ICD-10-CM | POA: Diagnosis not present

## 2018-11-04 DIAGNOSIS — Z886 Allergy status to analgesic agent status: Secondary | ICD-10-CM | POA: Diagnosis not present

## 2018-11-04 DIAGNOSIS — I4819 Other persistent atrial fibrillation: Secondary | ICD-10-CM | POA: Insufficient documentation

## 2018-11-04 DIAGNOSIS — Z880 Allergy status to penicillin: Secondary | ICD-10-CM | POA: Insufficient documentation

## 2018-11-04 DIAGNOSIS — Z87891 Personal history of nicotine dependence: Secondary | ICD-10-CM | POA: Insufficient documentation

## 2018-11-04 DIAGNOSIS — Z9889 Other specified postprocedural states: Secondary | ICD-10-CM

## 2018-11-04 DIAGNOSIS — C771 Secondary and unspecified malignant neoplasm of intrathoracic lymph nodes: Secondary | ICD-10-CM | POA: Insufficient documentation

## 2018-11-04 DIAGNOSIS — J449 Chronic obstructive pulmonary disease, unspecified: Secondary | ICD-10-CM | POA: Diagnosis not present

## 2018-11-04 DIAGNOSIS — Z8673 Personal history of transient ischemic attack (TIA), and cerebral infarction without residual deficits: Secondary | ICD-10-CM | POA: Insufficient documentation

## 2018-11-04 DIAGNOSIS — I5022 Chronic systolic (congestive) heart failure: Secondary | ICD-10-CM | POA: Diagnosis not present

## 2018-11-04 DIAGNOSIS — I110 Hypertensive heart disease with heart failure: Secondary | ICD-10-CM | POA: Insufficient documentation

## 2018-11-04 HISTORY — PX: ENDOBRONCHIAL ULTRASOUND: SHX5096

## 2018-11-04 SURGERY — ENDOBRONCHIAL ULTRASOUND (EBUS)
Anesthesia: General | Laterality: Left

## 2018-11-04 MED ORDER — ETOMIDATE 2 MG/ML IV SOLN
INTRAVENOUS | Status: AC
  Filled 2018-11-04: qty 10

## 2018-11-04 MED ORDER — FENTANYL CITRATE (PF) 100 MCG/2ML IJ SOLN: 25.0000 ug | INTRAMUSCULAR | Status: DC | PRN

## 2018-11-04 MED ORDER — LIDOCAINE HCL 2 % EX GEL: 1.0000 "application " | Freq: Once | CUTANEOUS | Status: DC

## 2018-11-04 MED ORDER — SUGAMMADEX SODIUM 200 MG/2ML IV SOLN
INTRAVENOUS | Status: AC
  Filled 2018-11-04: qty 2

## 2018-11-04 MED ORDER — FAMOTIDINE 20 MG PO TABS
ORAL_TABLET | ORAL | Status: AC
  Administered 2018-11-04: 20 mg via ORAL
  Filled 2018-11-04: qty 1

## 2018-11-04 MED ORDER — ROCURONIUM BROMIDE 100 MG/10ML IV SOLN
INTRAVENOUS | Status: DC | PRN
  Administered 2018-11-04: 40 mg via INTRAVENOUS
  Administered 2018-11-04 (×2): 10 mg via INTRAVENOUS

## 2018-11-04 MED ORDER — PROPOFOL 10 MG/ML IV BOLUS
INTRAVENOUS | Status: AC
  Filled 2018-11-04: qty 20

## 2018-11-04 MED ORDER — DEXAMETHASONE SODIUM PHOSPHATE 10 MG/ML IJ SOLN
INTRAMUSCULAR | Status: DC | PRN
  Administered 2018-11-04: 10 mg via INTRAVENOUS

## 2018-11-04 MED ORDER — FAMOTIDINE 20 MG PO TABS
20.0000 mg | ORAL_TABLET | Freq: Once | ORAL | Status: AC
  Administered 2018-11-04: 20 mg via ORAL

## 2018-11-04 MED ORDER — PHENYLEPHRINE HCL 0.25 % NA SOLN: 1.0000 | Freq: Four times a day (QID) | NASAL | Status: DC | PRN

## 2018-11-04 MED ORDER — FENTANYL CITRATE (PF) 100 MCG/2ML IJ SOLN
INTRAMUSCULAR | Status: DC | PRN
  Administered 2018-11-04 (×2): 25 ug via INTRAVENOUS
  Administered 2018-11-04: 50 ug via INTRAVENOUS

## 2018-11-04 MED ORDER — BUTAMBEN-TETRACAINE-BENZOCAINE 2-2-14 % EX AERO: 1.0000 | INHALATION_SPRAY | Freq: Once | CUTANEOUS | Status: DC

## 2018-11-04 MED ORDER — LIDOCAINE HCL (CARDIAC) PF 100 MG/5ML IV SOSY
PREFILLED_SYRINGE | INTRAVENOUS | Status: DC | PRN
  Administered 2018-11-04: 100 mg via INTRAVENOUS

## 2018-11-04 MED ORDER — ONDANSETRON HCL 4 MG/2ML IJ SOLN
INTRAMUSCULAR | Status: DC | PRN
  Administered 2018-11-04: 4 mg via INTRAVENOUS

## 2018-11-04 MED ORDER — SUCCINYLCHOLINE CHLORIDE 20 MG/ML IJ SOLN
INTRAMUSCULAR | Status: DC | PRN
  Administered 2018-11-04: 100 mg via INTRAVENOUS

## 2018-11-04 MED ORDER — LACTATED RINGERS IV SOLN
INTRAVENOUS | Status: DC
  Administered 2018-11-04: 13:00:00 via INTRAVENOUS

## 2018-11-04 MED ORDER — ETOMIDATE 2 MG/ML IV SOLN
INTRAVENOUS | Status: DC | PRN
  Administered 2018-11-04: 8.5 mg via INTRAVENOUS

## 2018-11-04 MED ORDER — FENTANYL CITRATE (PF) 100 MCG/2ML IJ SOLN
INTRAMUSCULAR | Status: AC
  Filled 2018-11-04: qty 2

## 2018-11-04 MED ORDER — SUGAMMADEX SODIUM 200 MG/2ML IV SOLN
INTRAVENOUS | Status: DC | PRN
  Administered 2018-11-04: 119.8 mg via INTRAVENOUS

## 2018-11-04 NOTE — Transfer of Care (Signed)
Immediate Anesthesia Transfer of Care Note  Patient: Patrick Harding  Procedure(s) Performed: ENDOBRONCHIAL ULTRASOUND (Left )  Patient Location: PACU  Anesthesia Type:General  Level of Consciousness: awake and sedated  Airway & Oxygen Therapy: Patient Spontanous Breathing and Patient connected to face mask oxygen  Post-op Assessment: Report given to RN and Post -op Vital signs reviewed and stable  Post vital signs: Reviewed and stable  Last Vitals:  Vitals Value Taken Time  BP 167/82 11/04/2018  2:30 PM  Temp 35.7 C 11/04/2018  2:30 PM  Pulse 132 11/04/2018  2:38 PM  Resp 17 11/04/2018  2:38 PM  SpO2 100 % 11/04/2018  2:38 PM  Vitals shown include unvalidated device data.  Last Pain:  Vitals:   11/04/18 1224  TempSrc: Temporal  PainSc: 10-Worst pain ever         Complications: No apparent anesthesia complications

## 2018-11-04 NOTE — Op Note (Signed)
[facility] Patient Name: Patrick Harding Procedure Date: 11/04/2018 2:03 PM MRN: [id number] Account #: [id number] Date of Birth: 09-18-1934 Admit Type: Outpatient Age: 84 Room: [facility] PROCEDURE RM 01 Gender: Male Note Status: Finalized Attending MD: [name] MD, MD Procedure:         Bronchoscopy Indications:       Suspicious hilum lesion Providers:         [name] MD, MD Referring MD:       Medicines:         General Anesthesia, See the Anesthesia note for                     documentation of the administered medications Complications:     No immediate complications Procedure:         Pre-Anesthesia Assessment:                    - A History and Physical has been performed. Patient meds                     and allergies have been reviewed. The risks and benefits                     of the procedure and the sedation options and risks were                     discussed with the patient. All questions were answered                     and informed consent was obtained. Patient identification                     and proposed procedure were verified prior to the                     procedure by the physician in the pre-procedure area.                     Mental Status Examination: alert and oriented. ASA Grade                     Assessment: III - A patient with severe systemic disease.                     After reviewing the risks and benefits, the patient was                     deemed in satisfactory condition to undergo the procedure.                     The anesthesia plan was to use deep sedation / analgesia.                     Immediately prior to administration of medications, the                     patient was re-assessed for adequacy to receive sedatives.                     The heart rate, respiratory rate, oxygen saturations,                     blood pressure, adequacy of pulmonary ventilation, and  response to care  were monitored throughout the procedure.                     The physical status of the patient was re-assessed after                     the procedure.                    After obtaining informed consent, the bronchoscope was                     passed under direct vision. Throughout the procedure, the                     patient's blood pressure, pulse, and oxygen saturations                     were monitored continuously.The procedure was accomplished                     without difficulty. The patient tolerated the procedure                     well. the Bronchoscope was introduced through the mouth,                     via the endotracheal tube and advanced to the                     tracheobronchial tree. Findings:      Left Lung Abnormalities: A partially obstructing (about 20% obstructed)       mass was found proximally, at the orifice. The mass was small and       endobronchial. The lesion was not traversed. Endobronchial biopsies were       performed in the left upper lobe using forceps. BAL was performed in the       lung and sent for. 20 mL of fluid were instilled. 10 mL were returned.       The return was bloody. Guided brushings were obtained in the anterior       segment of the left upper lobe. One sample was obtained. An       endobronchial ultrasound endoscope was utilized in order to assist with       fine needle aspiration in the left paratracheal (AP window) area.       Transbronchial needle aspiration of a lesion was performed in the left       hilum. Transbronchial needle aspiration technique was selected because       the sampling site was not visible endoscopically. The sampling device       penetrated the full thickness of the bronchial wall to obtain the needle       aspiration of lung tissue. Impression:        - An endobronchial mass was found. This lesion is                     suspicious for malignancy.                    - An endobronchial biopsy was  performed.                    - Bronchoalveolar lavage was performed.                    -  Brushings were obtained.                    - Endobronchial ultrasound was performed.                    - A transbronchial needle aspiration was performed.                    - Two abnormal lymph nodes in the left lower paratracheal                     region (level 4L). Recommendation:    - Await [tests] results. Thanh Pomerleau Verne Spurr MD, MD 11/04/2018 2:36:30 PM This report has been signed electronically. Number of Addenda: 0 Note Initiated On: 11/04/2018 2:03 PM      [facility]

## 2018-11-04 NOTE — Telephone Encounter (Signed)
Spoke to patient's wife, she stated he was doing much better and will be at procedure Tuesday.

## 2018-11-04 NOTE — Anesthesia Post-op Follow-up Note (Signed)
Anesthesia QCDR form completed.        

## 2018-11-04 NOTE — Anesthesia Procedure Notes (Signed)
Procedure Name: Intubation Date/Time: 11/04/2018 1:27 PM Performed by: Nelda Marseille, CRNA Pre-anesthesia Checklist: Patient identified, Patient being monitored, Timeout performed, Emergency Drugs available and Suction available Patient Re-evaluated:Patient Re-evaluated prior to induction Oxygen Delivery Method: Circle system utilized Preoxygenation: Pre-oxygenation with 100% oxygen Induction Type: IV induction Ventilation: Mask ventilation without difficulty Laryngoscope Size: Mac, 3 and McGraph Grade View: Grade I Tube type: Oral Tube size: 8.5 mm Number of attempts: 1 Airway Equipment and Method: Stylet and Video-laryngoscopy Placement Confirmation: ETT inserted through vocal cords under direct vision,  positive ETCO2 and breath sounds checked- equal and bilateral Secured at: 22 cm Tube secured with: Tape Dental Injury: Teeth and Oropharynx as per pre-operative assessment  Difficulty Due To: Difficulty was anticipated and Difficult Airway- due to anterior larynx

## 2018-11-04 NOTE — Anesthesia Preprocedure Evaluation (Addendum)
Anesthesia Evaluation  Patient identified by MRN, date of birth, ID band Patient awake    Reviewed: Allergy & Precautions, NPO status , Patient's Chart, lab work & pertinent test results  History of Anesthesia Complications Negative for: history of anesthetic complications  Airway Mallampati: III  TM Distance: >3 FB     Dental  (+) Edentulous Upper, Poor Dentition   Pulmonary neg sleep apnea, COPD, former smoker,    breath sounds clear to auscultation- rhonchi (-) wheezing      Cardiovascular hypertension, Pt. on medications +CHF (EF 30%)  (-) CAD, (-) Past MI, (-) Cardiac Stents and (-) CABG + dysrhythmias Atrial Fibrillation  Rhythm:irregular Rate:Normal - Systolic murmurs and - Diastolic murmurs NM stress test 01/11/16: No ischemia  Echo 12/08/15: SEVERE LV DYSFUNCTION (EF 30%) WITH MILD LVH NORMAL RIGHT VENTRICULAR SYSTOLIC FUNCTION VALVULAR REGURGITATION: MILD AR, MILD MR, MILD PR, MODERATE TR NO VALVULAR STENOSIS SEVERE LV DYSFUNCTION MILD TO MODERATE AR   Neuro/Psych CVA negative psych ROS   GI/Hepatic negative GI ROS, Neg liver ROS,   Endo/Other  negative endocrine ROSneg diabetes  Renal/GU Renal InsufficiencyRenal disease     Musculoskeletal negative musculoskeletal ROS (+)   Abdominal (+) - obese,   Peds  Hematology  (+) Blood dyscrasia, anemia ,   Anesthesia Other Findings Past Medical History: No date: Anemia 12/26/2015: Aortic aneurysm without rupture (HCC) No date: Arrhythmia No date: Atrial fibrillation (HCC) No date: Benign neoplasm of cecum No date: Benign neoplasm of transverse colon No date: Blood in stool 05/11/2017: Cachexia ([place]) 03/20/2016: CAP (community acquired pneumonia) 01/09/8656: Chronic systolic CHF (congestive heart failure), NYHA  class 3 ([place])     Comment:  Overview:  Global ef 25% No date: COPD (chronic obstructive pulmonary disease) ([place]) 03/12/2016: Dysarthria due  to recent stroke 01/03/2016: Dyspnea on exertion No date: Dysrhythmia     Comment:  atrial fib. 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 Thus, I elected to increase the              dose of metoprolol to 50 mg twice daily. Continue               anticoagulation. He is complaining of increased dyspnea               but his oxygen saturation is 95% on room air and his               lungs are relatively clear but he does have diminished               breath sounds. EKG does not show any new changes. Once               his heart rate i 03/21/2016: Pressure ulcer No date: Rectal polyp No date: Reflux esophagitis No date: Renal insufficiency 08/10/2015: Sacroiliac joint dysfunction 03/12/2016: Stroke determined by clinical assessment ([place]) 01/11/2016: Thoracic ascending aortic aneurysm ([place])     Comment:  Overview:  4.8cm 12/2015 No date: Vitamin  D deficiency 12/26/2015: Weakness of both legs   Reproductive/Obstetrics                            Anesthesia Physical  Anesthesia Plan  ASA: III  Anesthesia Plan: General   Post-op Pain Management:    Induction: Intravenous  PONV Risk Score and Plan: 1 and Ondansetron  Airway Management Planned: Oral ETT  Additional Equipment:   Intra-op Plan:   Post-operative Plan: Extubation in OR  Informed Consent: I have reviewed the  patients History and Physical, chart, labs and discussed the procedure including the risks, benefits and alternatives for the proposed anesthesia with the patient or authorized representative who has indicated his/her understanding and acceptance.   Dental advisory given  Plan Discussed with: CRNA and Anesthesiologist  Anesthesia Plan Comments:         Anesthesia Quick Evaluation

## 2018-11-04 NOTE — Interval H&P Note (Signed)
History and Physical Interval Note:  11/04/2018 12:58 PM  Patrick Harding  has presented today for surgery, with the diagnosis of LUNG MASS  The various methods of treatment have been discussed with the patient and family. After consideration of risks, benefits and other options for treatment, the patient has consented to  Procedure(s): ENDOBRONCHIAL ULTRASOUND (Left) as a surgical intervention .  The patient's history has been reviewed, patient examined, no change in status, stable for surgery.  I have reviewed the patient's chart and labs.  Questions were answered to the patient's satisfaction.     [name]

## 2018-11-05 ENCOUNTER — Encounter: Payer: Self-pay | Admitting: Internal Medicine

## 2018-11-05 NOTE — Anesthesia Postprocedure Evaluation (Signed)
Anesthesia Post Note  Patient: Patrick Harding  Procedure(s) Performed: ENDOBRONCHIAL ULTRASOUND (Left )  Patient location during evaluation: PACU Anesthesia Type: General Level of consciousness: awake and alert Pain management: pain level controlled Vital Signs Assessment: post-procedure vital signs reviewed and stable Respiratory status: spontaneous breathing, nonlabored ventilation, respiratory function stable and patient connected to nasal cannula oxygen Cardiovascular status: blood pressure returned to baseline and stable Postop Assessment: no apparent nausea or vomiting Anesthetic complications: no     Last Vitals:  Vitals:   11/04/18 1514 11/04/18 1533  BP: 132/86 (!) 149/85  Pulse: (!) 106 93  Resp: 18   Temp: (!) 35.8 C   SpO2: 95% 97%    Last Pain:  Vitals:   11/04/18 1533  TempSrc:   PainSc: 0-No pain                 Durenda Hurt

## 2018-11-06 LAB — ACID FAST SMEAR (AFB): ACID FAST SMEAR - AFSCU2: NEGATIVE

## 2018-11-06 LAB — ACID FAST SMEAR (AFB, MYCOBACTERIA)

## 2018-11-07 LAB — CULTURE, BAL-QUANTITATIVE W GRAM STAIN
Culture: NO GROWTH
Gram Stain: NONE SEEN
Special Requests: NORMAL

## 2018-11-07 LAB — CULTURE, BAL-QUANTITATIVE

## 2018-11-10 ENCOUNTER — Other Ambulatory Visit: Payer: Self-pay | Admitting: Pathology

## 2018-11-10 LAB — CYTOLOGY - NON PAP

## 2018-11-10 LAB — SURGICAL PATHOLOGY

## 2018-11-10 NOTE — Progress Notes (Signed)
  [facility]     Assessment and Plan:  Lung cancer with left hilar mass. - NSCLC, continue to follow up with Dr. Tasia Catchings.   COPD, chronic bronchitis. -Continue with nebulizers twice daily.    Return in about 3 months (around 02/10/2019).   Date: 11/10/2018  MRN# [id number] Patrick Harding [birth year]   Patrick Harding is a 84 y.o. old male seen in consultation for chief complaint of:    Chief Complaint  Patient presents with  . Follow-up    1 week f/u bronchoscopy:    HPI:  The patient is an 84 yo male previously seen due to chronic cough. CT imaging revealed left hilar lymphadenopathy, however EBUS was non-diagnostic. He was referred for follow-up PET scan, and asked to start on treatment for COPD medications.  At that time he did not want further follow-up felt that he would not want treatment for cancer or inhalers.  Subsequently PET scan showed progressively increasing size of the mass with increasing PET avidity strongly suggestive of malignancy.  He underwent repeat EBUS guided bronchoscopy on 11/04/2018 which was positive for lung cancer.  He feels that he is doing better, he has occasional cough especially when he lays down to sleep, he is using the nebulizer. He went to see Dr. Tasia Catchings earlier today, and he learned of the diagnosis of NSCLC.   **CT chest/PET scan performed on 10/08/18 and 10/21/18 in comparison with previous from 06/18/17. There is significant advancement of previously seen left hilar lymphadenopathy and right hilar lymphadenopathy. The area of hypermetabolism has significantly increased.    SPIROMETRY: FVC was 2.59 liters, 93% of predicted FEV1 was 1.73, 82% of predicted FEV1 ratio was 67 FEF 25-75% liters per second was 40% of predicted   LUNG VOLUMES: TLC was 77% of predicted RV was 71% of predicted   DIFFUSION CAPACITY: DLCO was 55% of predicted DLCO/VA was 69% of predicted   FLOW VOLUME LOOP: Expiratory flow volume loop is somewhat  delayed  Impression spirometry showed mild obstruction TC is slightly decreased DLCO is moderately decreased        Allergies:  Aspirin and Penicillins        LABORATORY PANEL:   CBC No results for input(s): WBC, HGB, HCT, PLT in the last 168 hours. ------------------------------------------------------------------------------------------------------------------  Chemistries  No results for input(s): NA, K, CL, CO2, GLUCOSE, BUN, CREATININE, CALCIUM, MG, AST, ALT, ALKPHOS, BILITOT in the last 168 hours.  Invalid input(s): GFRCGP ------------------------------------------------------------------------------------------------------------------  Cardiac Enzymes No results for input(s): TROPONINI in the last 168 hours. ------------------------------------------------------------  RADIOLOGY:  No results found.     Thank  you for the consultation and for allowing [facility] to assist in the care of your patient. Our recommendations are noted above.  Please contact us if we can be of further service.  Marda Stalker, M.D., F.C.C.P.  Board Certified in Internal Medicine, Pulmonary Medicine, [place], and Sleep Medicine.  [facility] Office Number: [phone]   11/10/2018

## 2018-11-11 ENCOUNTER — Encounter: Payer: Self-pay | Admitting: Oncology

## 2018-11-11 ENCOUNTER — Encounter: Payer: Self-pay | Admitting: Internal Medicine

## 2018-11-11 ENCOUNTER — Other Ambulatory Visit: Payer: Self-pay

## 2018-11-11 ENCOUNTER — Encounter: Payer: Self-pay | Admitting: *Deleted

## 2018-11-11 ENCOUNTER — Inpatient Hospital Stay: Payer: Medicare Other | Attending: Oncology | Admitting: Oncology

## 2018-11-11 ENCOUNTER — Ambulatory Visit (INDEPENDENT_AMBULATORY_CARE_PROVIDER_SITE_OTHER): Payer: Medicare Other | Admitting: Internal Medicine

## 2018-11-11 VITALS — BP 123/78 | HR 106 | Temp 97.9°F | Resp 18 | Wt 136.3 lb

## 2018-11-11 VITALS — BP 118/76 | HR 110 | Resp 16 | Ht 72.0 in | Wt 136.0 lb

## 2018-11-11 DIAGNOSIS — Z79899 Other long term (current) drug therapy: Secondary | ICD-10-CM | POA: Diagnosis not present

## 2018-11-11 DIAGNOSIS — I5022 Chronic systolic (congestive) heart failure: Secondary | ICD-10-CM

## 2018-11-11 DIAGNOSIS — Z87891 Personal history of nicotine dependence: Secondary | ICD-10-CM | POA: Insufficient documentation

## 2018-11-11 DIAGNOSIS — I10 Essential (primary) hypertension: Secondary | ICD-10-CM | POA: Insufficient documentation

## 2018-11-11 DIAGNOSIS — C3492 Malignant neoplasm of unspecified part of left bronchus or lung: Secondary | ICD-10-CM

## 2018-11-11 DIAGNOSIS — R530 Neoplastic (malignant) related fatigue: Secondary | ICD-10-CM

## 2018-11-11 DIAGNOSIS — I4891 Unspecified atrial fibrillation: Secondary | ICD-10-CM | POA: Diagnosis not present

## 2018-11-11 DIAGNOSIS — Z7901 Long term (current) use of anticoagulants: Secondary | ICD-10-CM | POA: Diagnosis not present

## 2018-11-11 DIAGNOSIS — C3412 Malignant neoplasm of upper lobe, left bronchus or lung: Secondary | ICD-10-CM | POA: Insufficient documentation

## 2018-11-11 DIAGNOSIS — I69351 Hemiplegia and hemiparesis following cerebral infarction affecting right dominant side: Secondary | ICD-10-CM | POA: Diagnosis not present

## 2018-11-11 DIAGNOSIS — C3490 Malignant neoplasm of unspecified part of unspecified bronchus or lung: Secondary | ICD-10-CM | POA: Diagnosis not present

## 2018-11-11 NOTE — Patient Instructions (Addendum)
Continue with the nebulizer twice daily.

## 2018-11-11 NOTE — Progress Notes (Signed)
  Oncology Nurse Navigator Documentation  Navigator Location: [facility] (11/11/18 1100)   )Navigator Encounter Type: Follow-up Appt;Diagnostic Results (11/11/18 1100)     Confirmed Diagnosis Date: 11/10/18 (11/11/18 1100)               Patient Visit Type: MedOnc (11/11/18 1100) Treatment Phase: Pre-Tx/Tx Discussion (11/11/18 1100) Barriers/Navigation Needs: Coordination of Care;Education (11/11/18 1100) Education: Understanding Cancer/ Treatment Options;Newly Diagnosed Cancer Education (11/11/18 1100) Interventions: Coordination of Care;Education (11/11/18 1100)   Coordination of Care: Appts (11/11/18 1100) Education Method: Verbal;Written (11/11/18 1100)     met with patient and his family during follow up visit with Dr. Tasia Catchings to discuss recent biopsy results and treatment options. All questions answered during visit. Pt and family given resources regarding diagnosis and supportive services available. Reviewed upcoming appts. Instructed to call with any further questions or needs. Pt and family verbalized understanding.           Time Spent with Patient: 60 (11/11/18 1100)

## 2018-11-11 NOTE — Progress Notes (Signed)
Hematology/Oncology follow up note [facility] [phone] [phone]   Patient Care Team: Alwyn Pea, NP as PCP - General (Family Medicine) Telford Nab, RN as Registered Nurse  REFERRING PROVIDER: Dr.Malinda  CHIEF COMPLAINTS/REASON FOR VISIT:  Evaluation of lung mass  HISTORY OF PRESENTING ILLNESS:  Brees Hounshell is a  84 y.o.  male with PMH listed below who was referred to me for evaluation of lung mass.   Patient recently presented emergency room for evaluation of fever of 101, nonproductive cough and profound weakness. CT done in the emergency room 10/08/2018 showed central left lung mass in the suprahilar region extending into AP window measuring approximately 3 x 4 x 6 cm, larger compared to previous exams.   Patient has been seen by Dr. Felicie Morn previously for lung mass. Patient's previous image work-up includes PET scan done 06/18/2017 which showed hypermetabolic right hilar mass or indeterminate etiology.  Favor bronchogenic carcinoma or lymphoma.  Mild metabolic activity of mediastinal lymph nodes is indeterminate.  Resolution of right middle lobe nodularity suggest infectious or inflammatory process.  Hypermetabolic asymmetric endplate lytic lesion at S1 with metabolic activity appear.  Favor benign process but cannot exclude solitary metastasis. Patient was seen and evaluated by Dr. Felicie Morn and had 01/28/2018 EBUS guided needle biopsy taking and several lymph node stations.  Patient also had a left upper lobe transbronchial cytology brushing, washing/BAL, removal of mucous plug from both lungs. Biopsy pathology was negative.  Per Dr. Ashby Dawes, patient's case was discussed on tumor board. Consensus reached to repeat a PET scan in 3 to 4 months for reevaluation and possible rebiopsy.  Patient had called office consult his follow-up appointments for PET scan and had not seen Dr. Felicie Morn since then.  #Today patient was  accompanied by wife and daughter to clinic to discuss CT scan and management plan. Reports feeling weak and fatigued.  Appetite has decreased.  His weight appears stable since February this year. Lives at home with wife. Mild shortness of breath with exertion.  Per family pack, patient does not walk much.  History of stroke in 2017, residual right sided weakness.  History of chronic systolic CHF, Neihart class III, global LVEF 25% [12/08/2015 2D echo] On Eliquis 2.5 twice daily for anticoagulation for risk reduction of stroke with atrial fibrillation/flutter.  INTERVAL HISTORY Ocean Schildt is a 84 y.o. male who has above history reviewed by me today presents for follow up visit for management of newly diagnosed lung non small cell lung cancer.  S/p BUS biopsy.  Left upper lobe biopsy showed respiratory mucosa with areas of squamous metaplasia and rare atypical cells.  Left hilar mass: EBUS positive for malignancy. 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  Paratracheal mass: suspicious for malignancy.  Lung left upper lobe, BAL: non diagnostic.  Left upper lobe brushing negative for malignancy.   He is accompanied by his wife and sister.  Patient spent more than half of day time in bed or recliner.Feels weak and has SOB with exertion. Chronic CHF, NYHA class 3. A-Fib/Flutter: on Eliquis 2.71m BID for risk reduction of stroke. Thoracic aorta aneurysm.      Review of Systems  Constitutional: Positive for malaise/fatigue. Negative for chills, fever and weight loss.  HENT: Negative for nosebleeds and sore throat.   Eyes: Negative for double vision and redness.  Respiratory: Positive for shortness of breath. Negative for cough and wheezing.   Cardiovascular: Negative for chest pain, palpitations and orthopnea.  Gastrointestinal:  Negative for abdominal pain, blood in stool, nausea and vomiting.  Genitourinary: Negative for dysuria and frequency.    Musculoskeletal: Negative for myalgias.       Right shoulder pain.   Skin: Negative for itching and rash.  Neurological: Positive for weakness. Negative for dizziness, tingling and tremors.  Endo/Heme/Allergies: Negative for environmental allergies. Does not bruise/bleed easily.  Psychiatric/Behavioral: Negative for hallucinations.    MEDICAL HISTORY:  Past Medical History:  Diagnosis Date  . Anemia   . Aortic aneurysm without rupture ([place]) 12/26/2015  . Arrhythmia   . Atrial fibrillation ([place])   . Benign neoplasm of cecum   . Benign neoplasm of transverse colon   . Blood in stool   . Cachexia ([place]) 05/11/2017  . CAP (community acquired pneumonia) 03/20/2016  . Chronic systolic CHF (congestive heart failure), NYHA class 3 ([place]) 12/26/2015   Overview:  Global ef 25%  . COPD (chronic obstructive pulmonary disease) ([place])   . Dysarthria due to recent stroke 03/12/2016  . Dyspnea on exertion 01/03/2016  . Dysrhythmia    atrial fib.  . Erectile dysfunction   . Essential hypertension 04/14/2015  . Gastritis   . Generalized weakness 05/11/2017  . Hyperlipidemia   . Hypertension   . Left elbow pain 05/11/2017  . Lower extremity weakness (Right) 01/24/2016  . 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 Lumbar facet syndrome 08/10/2015  . Lumbar foraminal stenosis (Left Moderate-severe L4-5) (Bilateral Moderate L5-S1) 03/12/2016  . Nonrheumatic aortic valve insufficiency 01/03/2016  . Panlobular emphysema ([place]) 01/03/2016  . Persistent atrial fibrillation 04/14/2015   Overview:  Last Assessment & Plan:  His ventricular rate continues to be elevated. Thus, I elected to increase the dose of metoprolol to 50 mg twice daily. Continue anticoagulation. He is complaining of increased dyspnea but his oxygen saturation is 95% on room air and his lungs are relatively clear but he does have diminished breath sounds. EKG does not show any new changes. Once his heart rate i  . Pressure ulcer 03/21/2016  . Rectal polyp   .  Reflux esophagitis   . Renal insufficiency   . Sacroiliac joint dysfunction 08/10/2015  . Stroke determined by clinical assessment ([place]) 03/12/2016  . Thoracic ascending aortic aneurysm ([place]) 01/11/2016   Overview:  4.8cm 12/2015  . Vitamin D deficiency   . Weakness of both legs 12/26/2015    SURGICAL HISTORY: Past Surgical History:  Procedure Laterality Date  . BACK SURGERY     lumbar  . COLONOSCOPY WITH PROPOFOL N/A 07/17/2016   Procedure: COLONOSCOPY WITH PROPOFOL;  Surgeon: [name], MD;  Location: [facility] ENDOSCOPY;  Service: Endoscopy;  Laterality: N/A;  . ENDOBRONCHIAL ULTRASOUND N/A 01/27/2018   Procedure: ENDOBRONCHIAL ULTRASOUND;  Surgeon: [name], MD;  Location: [facility] ORS;  Service: Pulmonary;  Laterality: N/A;  . ENDOBRONCHIAL ULTRASOUND Left 11/04/2018   Procedure: ENDOBRONCHIAL ULTRASOUND;  Surgeon: [name], MD;  Location: [facility] ORS;  Service: Pulmonary;  Laterality: Left;  . ESOPHAGOGASTRODUODENOSCOPY (EGD) WITH PROPOFOL N/A 07/17/2016   Procedure: ESOPHAGOGASTRODUODENOSCOPY (EGD) WITH PROPOFOL;  Surgeon: [name], MD;  Location: [facility] ENDOSCOPY;  Service: Endoscopy;  Laterality: N/A;  . EYE SURGERY     cataract right  . FACIAL FRACTURE SURGERY    . Head surgery Left   . TONSILLECTOMY      SOCIAL HISTORY: Social History   Socioeconomic History  . Marital status: Married    Spouse name: Not on file  . Number of children: Not on file  . Years of education: Not on  file  . Highest education level: Not on file  Occupational History  . Not on file  Social Needs  . Financial resource strain: Not on file  . Food insecurity:    Worry: Not on file    Inability: Not on file  . Transportation needs:    Medical: Not on file    Non-medical: Not on file  Tobacco Use  . Smoking status: Former Smoker    Packs/day: 0.50    Years: 20.00    Pack years: 10.00    Types: Cigarettes    Last attempt to quit: 10/24/2015    Years since quitting: 3.0  .  Smokeless tobacco: Never Used  Substance and Sexual Activity  . Alcohol use: No    Alcohol/week: 0.0 standard drinks    Comment: quit smoking about 4 years ago  . Drug use: No  . Sexual activity: Not on file  Lifestyle  . Physical activity:    Days per week: Not on file    Minutes per session: Not on file  . Stress: Not on file  Relationships  . Social connections:    Talks on phone: Not on file    Gets together: Not on file    Attends religious service: Not on file    Active member of club or organization: Not on file    Attends meetings of clubs or organizations: Not on file    Relationship status: Not on file  . Intimate partner violence:    Fear of current or ex partner: Not on file    Emotionally abused: Not on file    Physically abused: Not on file    Forced sexual activity: Not on file  Other Topics Concern  . Not on file  Social History Narrative  . Not on file    FAMILY HISTORY: Family History  Problem Relation Age of Onset  . Heart failure Mother   . Arthritis Father   . Bladder Cancer Neg Hx   . Kidney cancer Neg Hx   . Prolactinoma Neg Hx   . Prostate cancer Neg Hx     ALLERGIES:  is allergic to aspirin and penicillins.  MEDICATIONS:  Current Outpatient Medications  Medication Sig Dispense Refill  . acetaminophen (TYLENOL) 500 MG tablet Take 1,000 mg by mouth every 6 (six) hours as needed for mild pain or headache.    . AMBULATORY NON FORMULARY MEDICATION Medication Name: nebulizer and supplies DX:J43.1 1 each 0  . apixaban (ELIQUIS) 2.5 MG TABS tablet Take 1.25 mg by mouth 2 (two) times daily.     Marland Kitchen diltiazem (CARDIZEM CD) 180 MG 24 hr capsule Take 180 mg by mouth daily.    Marland Kitchen ipratropium-albuterol (DUONEB) 0.5-2.5 (3) MG/3ML SOLN Take 3 mLs by nebulization every 8 (eight) hours. And prn (Patient taking differently: Take 3 mLs by nebulization every 8 (eight) hours as needed (for shortness of breath or wheezing). ) 360 mL 11  . metoprolol (LOPRESSOR) 50  MG tablet Take 1 tablet (50 mg total) by mouth 2 (two) times daily. 60 tablet 2  . Multiple Vitamin (MULTI-VITAMINS) TABS Take 2 tablets by mouth daily.      No current facility-administered medications for this visit.      PHYSICAL EXAMINATION: ECOG PERFORMANCE STATUS: 3 - Symptomatic, >50% confined to bed Vitals:   11/11/18 0935  BP: 123/78  Pulse: (!) 106  Resp: 18  Temp: 97.9 F (36.6 C)  SpO2: 98%   Filed Weights   11/11/18 0935  Weight: 136 lb 4.8 oz (61.8 kg)    Physical Exam  Constitutional: He is oriented to person, place, and time. No distress.  Thin elderly male, chronic ill appearance, sitting in wheelchair. Cachectic.   HENT:  Head: Normocephalic and atraumatic.  Eyes: Pupils are equal, round, and reactive to light. EOM are normal. No scleral icterus.  Neck: Normal range of motion. Neck supple.  Cardiovascular: Normal rate, regular rhythm and normal heart sounds.  Pulmonary/Chest: Effort normal. No respiratory distress. He has no wheezes.  Abdominal: Soft. Bowel sounds are normal. He exhibits no distension and no mass. There is no tenderness.  Musculoskeletal: Normal range of motion. He exhibits no edema or deformity.  Neurological: He is alert and oriented to person, place, and time. No cranial nerve deficit. Coordination normal.  Skin: Skin is warm and dry. No rash noted. No erythema.  Psychiatric: He has a normal mood and affect. His behavior is normal. Thought content normal.     LABORATORY DATA:  I have reviewed the data as listed Lab Results  Component Value Date   WBC 17.4 (H) 10/31/2018   HGB 10.3 (L) 10/31/2018   HCT 32.5 (L) 10/31/2018   MCV 87.8 10/31/2018   PLT 313 10/31/2018   Recent Labs    04/18/18 1801 10/08/18 1553 10/31/18 0926  NA 137 136 137  K 4.1 4.6 4.1  CL 105 103 103  CO2 _0 GLUCOSE 111* 102* 100*  BUN 28* 25* 33*  CREATININE 1.41* 1.74* 1.55*  CALCIUM 8.9 8.5* 8.4*  GFRNONAA 45* 35* 40*  GFRAA 52* 40* 46*    PROT  --  7.6 6.6  ALBUMIN  --  3.1* 2.9*  AST  --  23 16  ALT  --  18 15  ALKPHOS  --  96 67  BILITOT  --  0.9 0.4  BILIDIR  --  0.3*  --   IBILI  --  0.6  --    Iron/TIBC/Ferritin/ %Sat No results found for: IRON, TIBC, FERRITIN, IRONPCTSAT    RADIOGRAPHIC STUDIES: I have personally reviewed the radiological images as listed and agreed with the findings in the report. # 10/08/2018 CT chest w contrast Central left lung mass in the suprahilar region extending into the AP window measuring approximately 3.4 x 6 cm likely larger compared to the previous exams. Found to be hypermetabolic on previous PET-CT and likely represents bronchogenic carcinoma. Adjacent mediastinal adenopathy as described. Several small pulmonary nodules bilaterally which may be due to metastatic disease although could be seen with atypical infectious or inflammatory processes. Recommend attention on follow-up.  Aneurysmal dilatation of the ascending thoracic aorta measuring 4.8 cm in AP diameter without significant change. Aneurysmal dilatation of the descending thoracic aorta measuring 4.8 cm in AP diameter. Ascending thoracic aortic aneurysm. Recommend semi-annual imaging followup by CTA or MRA and referral to cardiothoracic surgery if not already obtained. This recommendation follows 2010 ACCF/AHA/AATS/ACR/ASA/SCA/SCAI/SIR/STS/SVM Guidelines for the Diagnosis and Management of Patients With Thoracic Aortic Disease. Circulation. 2010; 121: Z124-P809.  ASSESSMENT & PLAN:  1. Non-small cell cancer of left lung ([place])   2. Former smoker   3. Chronic systolic congestive heart failure, NYHA class 3 ([place])   4. Neoplastic malignant related fatigue    #Non-small cell lung cancer, T4N3, possible M1 disease if bilateral axillary and porta cava node hypermetabolic activity are counted. At least stage IIIc, or stage IV disease  I had a lengthy discussion with patient's family members and patient.  PET scan  was independent  reviewed by me and I discussed.  Also discussed with patient about cancer diagnosis. Given patient's poor performance status, multiple comorbidity and advanced age,  he is a poor candidate for aggressive chemotherapy.  I recommend patient establish care with radiation oncology to see if definitive radiation can be done. If not, he may need palliative RT due to bulky disease and threats of complete bronchus blockage. Defer to Dr.Chrystal.  10/31/2018 CT head negative for acute intracranial abnormality. Small old left frontal infarct. MRI was held as previously he was not sure if he wants to proceed with bronchoscopy to establish diagnosis.  If definitive RT is planned, will obtain MRI.  Family members have a lot of questions, we spent sufficient time to discuss many aspect of care, questions were answered to patient's satisfaction.   Orders Placed This Encounter  Procedures  . Ambulatory referral to Radiation Oncology    Referral Priority:   Routine    Referral Type:   Consultation    Referral Reason:   Specialty Services Required    Referred to Provider:   Noreene Filbert, MD    Requested Specialty:   Radiation Oncology    Number of Visits Requested:   1    Return of visit: To be determined  Total face to face encounter time for this patient visit was 40 min. >50% of the time was  spent in counseling and coordination of care.   Earlie Server, MD, PhD Hematology Oncology [facility] Pager- [phone] 11/11/2018

## 2018-11-11 NOTE — Progress Notes (Signed)
Patient here for follow up. Pt complains of pain to right shoulder that started a few days ago. Pt states " I dont know what wrong with me, I can't even raise my arm.

## 2018-11-13 ENCOUNTER — Other Ambulatory Visit: Payer: Medicare Other

## 2018-11-13 NOTE — Progress Notes (Signed)
Tumor Board Documentation  Patrick Harding was presented by Dr Tasia Catchings at our Tumor Board on 11/13/2018, which included representatives from medical oncology, radiation oncology, internal medicine, navigation, pathology, radiology, surgical, research, pulmonology.  Patrick Harding currently presents as a current patient with history of the following treatments: surgical intervention(s).  Additionally, we reviewed previous medical and familial history, history of present illness, and recent lab results along with all available histopathologic and imaging studies. The tumor board considered available treatment options and made the following recommendations: Radiation therapy (primary modality) secondary to co morbidities    The following procedures/referrals were also placed: No orders of the defined types were placed in this encounter.   Clinical Trial Status: not discussed   Staging used: AJCC Stage Group  T4 N3 M0  National site-specific guidelines NCCN were discussed with respect to the case.  Tumor board is a meeting of clinicians from various specialty areas who evaluate and discuss patients for whom a multidisciplinary approach is being considered. Final determinations in the plan of care are those of the provider(s). The responsibility for follow up of recommendations given during tumor board is that of the provider.   Today's extended care, comprehensive team conference, Patrick Harding was not present for the discussion and was not examined.   Multidisciplinary Tumor Board is a multidisciplinary case peer review process.  Decisions discussed in the Multidisciplinary Tumor Board reflect the opinions of the specialists present at the conference without having examined the patient.  Ultimately, treatment and diagnostic decisions rest with the primary provider(s) and the patient.

## 2018-11-18 ENCOUNTER — Other Ambulatory Visit: Payer: Self-pay | Admitting: *Deleted

## 2018-11-18 MED ORDER — OXYCODONE HCL 5 MG PO TABS: 5.0000 mg | ORAL_TABLET | Freq: Four times a day (QID) | ORAL | 0 refills | Status: DC | PRN

## 2018-11-18 NOTE — Telephone Encounter (Signed)
Pt aware that prescription has been called in.

## 2018-11-19 ENCOUNTER — Encounter: Payer: Self-pay | Admitting: Radiation Oncology

## 2018-11-19 ENCOUNTER — Other Ambulatory Visit: Payer: Self-pay

## 2018-11-19 ENCOUNTER — Ambulatory Visit
Admission: RE | Admit: 2018-11-19 | Discharge: 2018-11-19 | Disposition: A | Discharge status: Home / Self Care | Payer: Medicare Other | Source: Ambulatory Visit | Attending: Radiation Oncology | Admitting: Radiation Oncology

## 2018-11-19 VITALS — BP 113/78 | HR 128 | Resp 16 | Wt 130.2 lb

## 2018-11-19 DIAGNOSIS — Z87891 Personal history of nicotine dependence: Secondary | ICD-10-CM | POA: Insufficient documentation

## 2018-11-19 DIAGNOSIS — C3492 Malignant neoplasm of unspecified part of left bronchus or lung: Secondary | ICD-10-CM | POA: Diagnosis present

## 2018-11-19 NOTE — Consult Note (Signed)
NEW PATIENT EVALUATION  Name: Patrick Harding  MRN: [id number]  Date:   11/19/2018     DOB: 1934-05-07   This 84 y.o. 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 Lung Cancer    Initial consultation for lung cancer    DIAGNOSIS: The encounter diagnosis was Non-small cell cancer of left lung ([place]).   PREVIOUS INVESTIGATIONS:  PET CT and CT scans reviewed Clinical notes reviewed Pathology reports reviewed  HPI: patient is a 84 year old male who presented back in October 2019 withfever nonproductive cough and lethargy. CT scan demonstrated a central left lung mass in the suprahilar region extending into the AP window measuring approximate 6 cm in greatest dimension.This originally been documented by PET CT scan back in July 2018 when a PET scan demonstrated right hilar mass which was hypermetabolic which may be infectious in nature.He underwent EVIS guided biopsy in February 2019 with pathology negative.he had a repeat PET CT scan4 months later he had lost weight and that timeframe and had increasing shortness of breath.he had a repeat scan showing progression and again underwent EBUS which now was positive for malignancy non-small cell lung cancer favoring squamous cell carcinoma. Paratracheal mass also had cytology status post suspicious for malignancy.patient has multiple comorbidities includingrevious congestive heart failure and stroke. PET CT scan also showed Porta cobble node did not progressed in size but is hypermetabolic concerning for possible M1 disease.based on his advanced age comorbidities was not felt suitable for concurrent chemoradiation. He is now referred to radiation oncology for consideration of treatment. Again he is somewhat fatigued has a mild nonproductive cough no dysphagia. She continues to have poor  appetite.  PLANNED TREATMENT REGIMEN: radiation therapy with curative intent to the chest  PAST MEDICAL HISTORY:  has a past medical history of Anemia, Aortic aneurysm without rupture ([place]) (12/26/2015), Arrhythmia, Atrial fibrillation ([place]), Benign neoplasm of cecum, Benign neoplasm of transverse colon, Blood in stool, Cachexia ([place]) (05/11/2017), CAP (community acquired pneumonia) (03/18/8118), Chronic systolic CHF (congestive heart failure), NYHA class 3 ([place]) (12/26/2015), COPD (chronic obstructive pulmonary disease) ([place]), Dysarthria due to recent stroke (03/12/2016), Dyspnea on exertion (01/03/2016), Dysrhythmia, Erectile dysfunction, Essential hypertension (04/14/2015), Gastritis, Generalized weakness (05/11/2017), Hyperlipidemia, Hypertension, Left elbow pain (05/11/2017), Lower extremity weakness (Right) (01/24/2016), 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), Lumbar facet syndrome (08/10/2015), Lumbar foraminal stenosis (Left Moderate-severe L4-5) (Bilateral Moderate L5-S1) (03/12/2016), Nonrheumatic aortic valve insufficiency (01/03/2016), Panlobular emphysema ([place]) (01/03/2016), Persistent atrial fibrillation (04/14/2015), Pressure ulcer (03/21/2016), Rectal polyp, Reflux esophagitis, Renal insufficiency, Sacroiliac joint dysfunction (08/10/2015), Stroke determined by clinical assessment ([place]) (03/12/2016), Thoracic ascending aortic aneurysm ([place]) (01/11/2016), Vitamin D deficiency, and Weakness of both legs (12/26/2015).    PAST SURGICAL HISTORY:  Past Surgical History:  Procedure Laterality Date  . BACK SURGERY     lumbar  . COLONOSCOPY WITH PROPOFOL N/A 07/17/2016   Procedure: COLONOSCOPY WITH PROPOFOL;  Surgeon: [name], MD;  Location: [facility] ENDOSCOPY;  Service: Endoscopy;  Laterality: N/A;  . ENDOBRONCHIAL ULTRASOUND N/A 01/27/2018   Procedure: ENDOBRONCHIAL ULTRASOUND;  Surgeon: [name], MD;  Location: [facility] ORS;  Service: Pulmonary;  Laterality: N/A;  . ENDOBRONCHIAL ULTRASOUND Left 11/04/2018   Procedure:  ENDOBRONCHIAL ULTRASOUND;  Surgeon: [name], MD;  Location: [facility] ORS;  Service: Pulmonary;  Laterality: Left;  . ESOPHAGOGASTRODUODENOSCOPY (EGD) WITH PROPOFOL N/A 07/17/2016   Procedure: ESOPHAGOGASTRODUODENOSCOPY (EGD) WITH PROPOFOL;  Surgeon: [name], MD;  Location: [facility] ENDOSCOPY;  Service: Endoscopy;  Laterality: N/A;  . EYE SURGERY     cataract right  . FACIAL FRACTURE SURGERY    . Head surgery Left   . TONSILLECTOMY      FAMILY HISTORY: family history includes Arthritis in his father; Heart failure in his mother.  SOCIAL HISTORY:  reports that he quit smoking about 3 years ago. His smoking use included cigarettes. He has a 10.00 pack-year smoking history. He has never used smokeless tobacco. He reports that he does not drink alcohol or use drugs.  ALLERGIES: Aspirin and Penicillins  MEDICATIONS:  Current Outpatient Medications  Medication Sig Dispense Refill  . acetaminophen (TYLENOL) 500 MG tablet Take 1,000 mg by mouth every 6 (six) hours as needed for mild pain or headache.    . AMBULATORY NON FORMULARY MEDICATION Medication Name: nebulizer and supplies DX:J43.1 1 each 0  . apixaban (ELIQUIS) 2.5 MG TABS tablet Take 1.25 mg by mouth 2 (two) times daily.     Marland Kitchen diltiazem (CARDIZEM CD) 180 MG 24 hr capsule Take 180 mg by mouth daily.    Marland Kitchen ipratropium-albuterol (DUONEB) 0.5-2.5 (3) MG/3ML SOLN Take 3 mLs by nebulization every 8 (eight) hours. And prn (Patient taking differently: Take 3 mLs by nebulization every 8 (eight) hours as needed (for shortness of breath or wheezing). ) 360 mL 11  . metoprolol (LOPRESSOR) 50 MG tablet Take 1 tablet (50 mg total) by mouth 2 (two) times daily. 60 tablet 2  . Multiple Vitamin (MULTI-VITAMINS) TABS Take 2 tablets by mouth daily.     Marland Kitchen oxyCODONE (OXY IR/ROXICODONE) 5 MG immediate release tablet Take 1 tablet (5 mg total) by mouth every 6 (six) hours as needed for severe pain. (Patient not taking: Reported on 11/19/2018) 30 tablet 0    No current facility-administered medications for this encounter.     ECOG PERFORMANCE STATUS:  1 - Symptomatic but completely ambulatory  REVIEW OF SYSTEMS: except for the left-sided weakness poor appetite difficulty ambulating Patient denies any weight loss, fatigue, weakness, fever, chills or night sweats. Patient denies any loss of vision, blurred vision. Patient denies any ringing  of the ears or hearing loss. No irregular heartbeat. Patient denies heart murmur or history of fainting. Patient denies any chest pain or pain radiating to her upper extremities. Patient denies any shortness of breath, difficulty breathing at night, cough or hemoptysis. Patient denies any swelling in the lower legs. Patient denies any nausea vomiting, vomiting of blood, or coffee ground material in the vomitus. Patient denies any stomach pain. Patient states has had normal bowel movements no significant constipation or diarrhea. Patient denies any dysuria, hematuria or significant nocturia. Patient denies any problems walking, swelling in the joints or loss of balance. Patient denies any skin changes, loss of hair or loss of weight. Patient denies any excessive worrying or anxiety or significant depression. Patient denies any problems with insomnia. Patient denies excessive thirst, polyuria, polydipsia. Patient denies any swollen glands, patient denies easy bruising or easy bleeding. Patient denies any recent infections, allergies or URI. Patient "s visual fields have not changed significantly in recent time.    PHYSICAL EXAM: BP 113/78 (BP Location: Left Arm, Patient Position: Sitting)   Pulse (!) 128   Resp 16   Wt 130 lb 2.9 oz (59.1 kg)   BMI 17.66 kg/m  Wheelchair-bound male in NAD.Well-developed well-nourished patient in NAD. HEENT reveals PERLA, EOMI, discs not visualized.  Oral cavity is clear. No  oral mucosal lesions are identified. Neck is clear without evidence of cervical or supraclavicular adenopathy.  Lungs are clear to A&P. Cardiac examination is essentially unremarkable with regular rate and rhythm without murmur rub or thrill. Abdomen is benign with no organomegaly or masses noted. Motor sensory and DTR levels are equal and symmetric in the upper and lower extremities. Cranial nerves II through XII are grossly intact. Proprioception is intact. No peripheral adenopathy or edema is identified. No motor or sensory levels are noted. Crude visual fields are within normal range.  LABORATORY DATA: pathology reports reviewed    RADIOLOGY RESULTS:serial CT and PET/CT scans reviewed and compatible above-stated findings   IMPRESSION: t least stage IIIB (T4 based on mediastinal invasion N2 M0)squamous cell carcinoma the left lung in 84 year old male with multiple medical comorbidities  PLAN: t this time I to go ahead with radiation therapy to his left lung mass. Would plan on delivering 7000 cGy over 7 weeks. Agree with plan not to use concurrent chemotherapy therapy. Risks and benefits of treatment including fatigue possible radiation esophagitis skin reaction alteration of blood counts involvement of cough all were discussed in detail with the patient and his family. I have personally set up and ordered CT simulation. I would favor I am RT treatment planning and delivery based on the large lung mass close proximity to his heart and spinal cord for treatment planning purposes. Patient is family seem to comprehend my treatment plan well.  I would like to take this opportunity to thank you for allowing me to participate in the care of your patient.Noreene Filbert, MD

## 2018-11-19 NOTE — Research (Signed)
Human Biospecimens for the Discovery and Validation of Biomarkers for the Prediction, Diagnosis and Management of Disease  [id number] - [id number] PIBobbye Riggs, MD Attending: Yu Visit: Consent  Met with patient, wife, daughter and son in law today at patient's radiation appointment to introduce study. Study was explained to patient and family. Patient took the Informed Consent Form (IRB approved 06/26/2018) home to review.  The patient was provided with the contact information for the PI, research associate and the IRB if they have any research related questions.  Family will call me when they make decision regarding participation and agreed for me to follow up with them.   Lula Olszewski Oncology Research Assistant 11/19/2018 10:40 AM

## 2018-11-20 ENCOUNTER — Ambulatory Visit
Admission: RE | Admit: 2018-11-20 | Discharge: 2018-11-20 | Disposition: A | Discharge status: Home / Self Care | Payer: Medicare Other | Source: Ambulatory Visit | Attending: Radiation Oncology | Admitting: Radiation Oncology

## 2018-11-20 DIAGNOSIS — C3492 Malignant neoplasm of unspecified part of left bronchus or lung: Secondary | ICD-10-CM | POA: Diagnosis not present

## 2018-11-21 ENCOUNTER — Encounter: Payer: Self-pay | Admitting: Oncology

## 2018-11-24 DIAGNOSIS — C3492 Malignant neoplasm of unspecified part of left bronchus or lung: Secondary | ICD-10-CM | POA: Diagnosis not present

## 2018-11-28 ENCOUNTER — Other Ambulatory Visit: Payer: Self-pay | Admitting: Oncology

## 2018-11-28 ENCOUNTER — Other Ambulatory Visit: Payer: Self-pay | Admitting: *Deleted

## 2018-11-28 ENCOUNTER — Telehealth: Payer: Self-pay | Admitting: *Deleted

## 2018-11-28 DIAGNOSIS — C3492 Malignant neoplasm of unspecified part of left bronchus or lung: Secondary | ICD-10-CM

## 2018-11-28 MED ORDER — MEGESTROL ACETATE 40 MG PO TABS: 40.0000 mg | ORAL_TABLET | Freq: Two times a day (BID) | ORAL | 0 refills | Status: DC

## 2018-11-28 NOTE — Telephone Encounter (Signed)
Rx of Megace sent to pharmacy, I will notify pt

## 2018-11-28 NOTE — Telephone Encounter (Signed)
Pt's wife left message that pt has decreased appetite and weakness. Would like a call back. Please advise.

## 2018-12-01 ENCOUNTER — Ambulatory Visit
Admission: RE | Admit: 2018-12-01 | Discharge: 2018-12-01 | Disposition: A | Discharge status: Home / Self Care | Payer: Medicare Other | Source: Ambulatory Visit | Attending: Radiation Oncology | Admitting: Radiation Oncology

## 2018-12-04 ENCOUNTER — Ambulatory Visit
Admission: RE | Admit: 2018-12-04 | Discharge: 2018-12-04 | Disposition: A | Discharge status: Home / Self Care | Payer: Medicare Other | Source: Ambulatory Visit | Attending: Radiation Oncology | Admitting: Radiation Oncology

## 2018-12-04 DIAGNOSIS — C3492 Malignant neoplasm of unspecified part of left bronchus or lung: Secondary | ICD-10-CM | POA: Diagnosis not present

## 2018-12-05 ENCOUNTER — Ambulatory Visit
Admission: RE | Admit: 2018-12-05 | Discharge: 2018-12-05 | Disposition: A | Discharge status: Home / Self Care | Payer: Medicare Other | Source: Ambulatory Visit | Attending: Radiation Oncology | Admitting: Radiation Oncology

## 2018-12-05 DIAGNOSIS — C3492 Malignant neoplasm of unspecified part of left bronchus or lung: Secondary | ICD-10-CM | POA: Diagnosis not present

## 2018-12-07 LAB — FUNGUS CULTURE WITH STAIN

## 2018-12-07 LAB — FUNGAL ORGANISM REFLEX

## 2018-12-07 LAB — FUNGUS CULTURE RESULT

## 2018-12-08 ENCOUNTER — Inpatient Hospital Stay (HOSPITAL_BASED_OUTPATIENT_CLINIC_OR_DEPARTMENT_OTHER): Payer: Medicare Other | Admitting: Oncology

## 2018-12-08 ENCOUNTER — Encounter: Payer: Self-pay | Admitting: Oncology

## 2018-12-08 ENCOUNTER — Inpatient Hospital Stay: Payer: Medicare Other

## 2018-12-08 ENCOUNTER — Ambulatory Visit
Admission: RE | Admit: 2018-12-08 | Discharge: 2018-12-08 | Disposition: A | Discharge status: Home / Self Care | Payer: Medicare Other | Source: Ambulatory Visit | Attending: Radiation Oncology | Admitting: Radiation Oncology

## 2018-12-08 ENCOUNTER — Other Ambulatory Visit: Payer: Self-pay | Admitting: *Deleted

## 2018-12-08 ENCOUNTER — Other Ambulatory Visit: Payer: Self-pay

## 2018-12-08 VITALS — BP 109/67 | HR 63 | Temp 97.3°F | Resp 18

## 2018-12-08 DIAGNOSIS — E860 Dehydration: Secondary | ICD-10-CM

## 2018-12-08 DIAGNOSIS — I10 Essential (primary) hypertension: Secondary | ICD-10-CM | POA: Diagnosis not present

## 2018-12-08 DIAGNOSIS — Z79899 Other long term (current) drug therapy: Secondary | ICD-10-CM

## 2018-12-08 DIAGNOSIS — C3412 Malignant neoplasm of upper lobe, left bronchus or lung: Secondary | ICD-10-CM

## 2018-12-08 DIAGNOSIS — C3492 Malignant neoplasm of unspecified part of left bronchus or lung: Secondary | ICD-10-CM

## 2018-12-08 DIAGNOSIS — Z87891 Personal history of nicotine dependence: Secondary | ICD-10-CM

## 2018-12-08 DIAGNOSIS — R634 Abnormal weight loss: Secondary | ICD-10-CM

## 2018-12-08 DIAGNOSIS — R5383 Other fatigue: Secondary | ICD-10-CM

## 2018-12-08 DIAGNOSIS — R64 Cachexia: Secondary | ICD-10-CM

## 2018-12-08 LAB — CBC WITH DIFFERENTIAL/PLATELET
Abs Immature Granulocytes: 0.04 10*3/uL (ref 0.00–0.07)
Basophils Absolute: 0 10*3/uL (ref 0.0–0.1)
Basophils Relative: 0 %
Eosinophils Absolute: 0.3 10*3/uL (ref 0.0–0.5)
Eosinophils Relative: 3 %
HCT: 34.8 % — ABNORMAL LOW (ref 39.0–52.0)
HEMOGLOBIN: 11 g/dL — AB (ref 13.0–17.0)
IMMATURE GRANULOCYTES: 0 %
LYMPHS PCT: 21 %
Lymphs Abs: 2.2 10*3/uL (ref 0.7–4.0)
MCH: 26.8 pg (ref 26.0–34.0)
MCHC: 31.6 g/dL (ref 30.0–36.0)
MCV: 84.9 fL (ref 80.0–100.0)
Monocytes Absolute: 1.1 10*3/uL — ABNORMAL HIGH (ref 0.1–1.0)
Monocytes Relative: 10 %
NEUTROS ABS: 6.9 10*3/uL (ref 1.7–7.7)
Neutrophils Relative %: 66 %
Platelets: 306 10*3/uL (ref 150–400)
RBC: 4.1 MIL/uL — AB (ref 4.22–5.81)
RDW: 15.4 % (ref 11.5–15.5)
WBC: 10.5 10*3/uL (ref 4.0–10.5)
nRBC: 0 % (ref 0.0–0.2)

## 2018-12-08 LAB — COMPREHENSIVE METABOLIC PANEL
ALK PHOS: 89 U/L (ref 38–126)
ALT: 9 U/L (ref 0–44)
ANION GAP: 12 (ref 5–15)
AST: 19 U/L (ref 15–41)
Albumin: 3.5 g/dL (ref 3.5–5.0)
BUN: 29 mg/dL — ABNORMAL HIGH (ref 8–23)
CALCIUM: 9.2 mg/dL (ref 8.9–10.3)
CO2: 24 mmol/L (ref 22–32)
Chloride: 104 mmol/L (ref 98–111)
Creatinine, Ser: 1.78 mg/dL — ABNORMAL HIGH (ref 0.61–1.24)
GFR calc Af Amer: 40 mL/min — ABNORMAL LOW (ref >60)
GFR calc non Af Amer: 35 mL/min — ABNORMAL LOW (ref >60)
Glucose, Bld: 87 mg/dL (ref 70–99)
Potassium: 4.7 mmol/L (ref 3.5–5.1)
SODIUM: 140 mmol/L (ref 135–145)
Total Bilirubin: 0.6 mg/dL (ref 0.3–1.2)
Total Protein: 8.6 g/dL — ABNORMAL HIGH (ref 6.5–8.1)

## 2018-12-08 NOTE — Progress Notes (Signed)
Patient here from radiation. Pt has been feeling weak with no energy the past few days. Pt has continuous cough.

## 2018-12-09 ENCOUNTER — Ambulatory Visit
Admission: RE | Admit: 2018-12-09 | Discharge: 2018-12-09 | Disposition: A | Discharge status: Home / Self Care | Payer: Medicare Other | Source: Ambulatory Visit | Attending: Radiation Oncology | Admitting: Radiation Oncology

## 2018-12-09 DIAGNOSIS — C3492 Malignant neoplasm of unspecified part of left bronchus or lung: Secondary | ICD-10-CM | POA: Diagnosis not present

## 2018-12-09 LAB — TSH: TSH: 1.986 u[IU]/mL (ref 0.350–4.500)

## 2018-12-09 NOTE — Progress Notes (Signed)
Hematology/Oncology follow up note [facility] [phone] [phone]   Patient Care Team: Alwyn Pea, NP as PCP - General (Family Medicine) Telford Nab, RN as Registered Nurse  REFERRING PROVIDER: Dr.Malinda  CHIEF COMPLAINTS/REASON FOR VISIT:  Evaluation of lung mass  HISTORY OF PRESENTING ILLNESS:  Patrick Harding is a  84 y.o.  male with PMH listed below who was referred to me for evaluation of lung mass.   Patient recently presented emergency room for evaluation of fever of 101, nonproductive cough and profound weakness. CT done in the emergency room 10/08/2018 showed central left lung mass in the suprahilar region extending into AP window measuring approximately 3 x 4 x 6 cm, larger compared to previous exams.   Patient has been seen by Dr. Felicie Morn previously for lung mass. Patient's previous image work-up includes PET scan done 06/18/2017 which showed hypermetabolic right hilar mass or indeterminate etiology.  Favor bronchogenic carcinoma or lymphoma.  Mild metabolic activity of mediastinal lymph nodes is indeterminate.  Resolution of right middle lobe nodularity suggest infectious or inflammatory process.  Hypermetabolic asymmetric endplate lytic lesion at S1 with metabolic activity appear.  Favor benign process but cannot exclude solitary metastasis. Patient was seen and evaluated by Dr. Felicie Morn and had 01/28/2018 EBUS guided needle biopsy taking and several lymph node stations.  Patient also had a left upper lobe transbronchial cytology brushing, washing/BAL, removal of mucous plug from both lungs. Biopsy pathology was negative.  Per Dr. Ashby Dawes, patient's case was discussed on tumor board. Consensus reached to repeat a PET scan in 3 to 4 months for reevaluation and possible rebiopsy.  Patient had called office consult his follow-up appointments for PET scan and had not seen Dr. Felicie Morn since then.  #Today patient was  accompanied by wife and daughter to clinic to discuss CT scan and management plan. Reports feeling weak and fatigued.  Appetite has decreased.  His weight appears stable since February this year. Lives at home with wife. Mild shortness of breath with exertion.  Per family pack, patient does not walk much.  History of stroke in 2017, residual right sided weakness.  History of chronic systolic CHF, Neihart class III, global LVEF 25% [12/08/2015 2D echo] On Eliquis 2.5 twice daily for anticoagulation for risk reduction of stroke with atrial fibrillation/flutter.  # S/p BUS biopsy.  Left upper lobe biopsy showed respiratory mucosa with areas of squamous metaplasia and rare atypical cells.  Left hilar mass: EBUS positive for malignancy. 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  Paratracheal mass: suspicious for malignancy.  Lung left upper lobe, BAL: non diagnostic.  Left upper lobe brushing negative for malignancy.    INTERVAL HISTORY Patrick Harding is a 84 y.o. male who has above history reviewed by me today presents for follow up visit for management of lung cancer. Patient currently on radiation for treatment of stage III lung cancer. Not candidate for concurrent chemotherapy due to poor performance status. Patient reports feeling weakness, poor appetite to radiation RN who referred patient to see me for further evaluation.  Patient is accompanied by his family members.  Reports not eating or drinking fluid well for the past couple of days. Denies any fever, chills, shortness of breath more than his baseline, chest pain, abdominal pain. Chronic shortness of breath with exertion.  Chronic CHF Neihart class III A. fib/flutter on Eliquis 2.5 mg twice daily for risk reduction of stroke.  Thoracic aorta aneurysm Continues to have a chronic cough.  Right shoulder pain is better controlled with current pain regimen oxycodone 5 mg every 6 hours as needed.  Review  of Systems  Constitutional: Positive for malaise/fatigue. Negative for chills, fever and weight loss.  HENT: Negative for nosebleeds and sore throat.   Eyes: Negative for double vision and redness.  Respiratory: Positive for cough and shortness of breath. Negative for wheezing.   Cardiovascular: Negative for chest pain, palpitations, orthopnea and leg swelling.  Gastrointestinal: Negative for abdominal pain, blood in stool, nausea and vomiting.  Genitourinary: Negative for dysuria and frequency.  Musculoskeletal: Negative for myalgias.       Right shoulder pain.   Skin: Negative for itching and rash.  Neurological: Positive for weakness. Negative for dizziness, tingling and tremors.  Endo/Heme/Allergies: Negative for environmental allergies. Does not bruise/bleed easily.  Psychiatric/Behavioral: Negative for hallucinations.    MEDICAL HISTORY:  Past Medical History:  Diagnosis Date  . Anemia   . Aortic aneurysm without rupture ([place]) 12/26/2015  . Arrhythmia   . Atrial fibrillation ([place])   . Benign neoplasm of cecum   . Benign neoplasm of transverse colon   . Blood in stool   . Cachexia ([place]) 05/11/2017  . CAP (community acquired pneumonia) 03/20/2016  . Chronic systolic CHF (congestive heart failure), NYHA class 3 ([place]) 12/26/2015   Overview:  Global ef 25%  . COPD (chronic obstructive pulmonary disease) ([place])   . Dysarthria due to recent stroke 03/12/2016  . Dyspnea on exertion 01/03/2016  . Dysrhythmia    atrial fib.  . Erectile dysfunction   . Essential hypertension 04/14/2015  . Gastritis   . Generalized weakness 05/11/2017  . Hyperlipidemia   . Hypertension   . Left elbow pain 05/11/2017  . Lower extremity weakness (Right) 01/24/2016  . 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 Lumbar facet syndrome 08/10/2015  . Lumbar foraminal stenosis (Left Moderate-severe L4-5) (Bilateral Moderate L5-S1) 03/12/2016  . Nonrheumatic aortic valve insufficiency 01/03/2016  . Panlobular emphysema ([place])  01/03/2016  . Persistent atrial fibrillation 04/14/2015   Overview:  Last Assessment & Plan:  His ventricular rate continues to be elevated. Thus, I elected to increase the dose of metoprolol to 50 mg twice daily. Continue anticoagulation. He is complaining of increased dyspnea but his oxygen saturation is 95% on room air and his lungs are relatively clear but he does have diminished breath sounds. EKG does not show any new changes. Once his heart rate i  . Pressure ulcer 03/21/2016  . Rectal polyp   . Reflux esophagitis   . Renal insufficiency   . Sacroiliac joint dysfunction 08/10/2015  . Stroke determined by clinical assessment ([place]) 03/12/2016  . Thoracic ascending aortic aneurysm ([place]) 01/11/2016   Overview:  4.8cm 12/2015  . Vitamin D deficiency   . Weakness of both legs 12/26/2015    SURGICAL HISTORY: Past Surgical History:  Procedure Laterality Date  . BACK SURGERY     lumbar  . COLONOSCOPY WITH PROPOFOL N/A 07/17/2016   Procedure: COLONOSCOPY WITH PROPOFOL;  Surgeon: [name], MD;  Location: [facility] ENDOSCOPY;  Service: Endoscopy;  Laterality: N/A;  . ENDOBRONCHIAL ULTRASOUND N/A 01/27/2018   Procedure: ENDOBRONCHIAL ULTRASOUND;  Surgeon: [name], MD;  Location: [facility] ORS;  Service: Pulmonary;  Laterality: N/A;  . ENDOBRONCHIAL ULTRASOUND Left 11/04/2018   Procedure: ENDOBRONCHIAL ULTRASOUND;  Surgeon: [name], MD;  Location: [facility] ORS;  Service: Pulmonary;  Laterality: Left;  . ESOPHAGOGASTRODUODENOSCOPY (EGD) WITH PROPOFOL N/A 07/17/2016   Procedure: ESOPHAGOGASTRODUODENOSCOPY (EGD) WITH PROPOFOL;  Surgeon: [name], MD;  Location: [facility] ENDOSCOPY;  Service: Endoscopy;  Laterality: N/A;  . EYE SURGERY     cataract right  . FACIAL FRACTURE SURGERY    . Head surgery Left   . TONSILLECTOMY      SOCIAL HISTORY: Social History   Socioeconomic History  . Marital status: Married    Spouse name: Not on file  . Number of children: Not on file  . Years of  education: Not on file  . Highest education level: Not on file  Occupational History  . Not on file  Social Needs  . Financial resource strain: Not on file  . Food insecurity:    Worry: Not on file    Inability: Not on file  . Transportation needs:    Medical: Not on file    Non-medical: Not on file  Tobacco Use  . Smoking status: Former Smoker    Packs/day: 0.50    Years: 20.00    Pack years: 10.00    Types: Cigarettes    Last attempt to quit: 10/24/2015    Years since quitting: 3.1  . Smokeless tobacco: Never Used  Substance and Sexual Activity  . Alcohol use: No    Alcohol/week: 0.0 standard drinks    Comment: quit smoking about 4 years ago  . Drug use: No  . Sexual activity: Not on file  Lifestyle  . Physical activity:    Days per week: Not on file    Minutes per session: Not on file  . Stress: Not on file  Relationships  . Social connections:    Talks on phone: Not on file    Gets together: Not on file    Attends religious service: Not on file    Active member of club or organization: Not on file    Attends meetings of clubs or organizations: Not on file    Relationship status: Not on file  . Intimate partner violence:    Fear of current or ex partner: Not on file    Emotionally abused: Not on file    Physically abused: Not on file    Forced sexual activity: Not on file  Other Topics Concern  . Not on file  Social History Narrative  . Not on file    FAMILY HISTORY: Family History  Problem Relation Age of Onset  . Heart failure Mother   . Arthritis Father   . Bladder Cancer Neg Hx   . Kidney cancer Neg Hx   . Prolactinoma Neg Hx   . Prostate cancer Neg Hx     ALLERGIES:  is allergic to aspirin and penicillins.  MEDICATIONS:  Current Outpatient Medications  Medication Sig Dispense Refill  . acetaminophen (TYLENOL) 500 MG tablet Take 1,000 mg by mouth every 6 (six) hours as needed for mild pain or headache.    . AMBULATORY NON FORMULARY MEDICATION  Medication Name: nebulizer and supplies DX:J43.1 1 each 0  . apixaban (ELIQUIS) 2.5 MG TABS tablet Take 1.25 mg by mouth 2 (two) times daily.     Marland Kitchen diltiazem (CARDIZEM CD) 180 MG 24 hr capsule Take 180 mg by mouth daily.    Marland Kitchen ipratropium-albuterol (DUONEB) 0.5-2.5 (3) MG/3ML SOLN Take 3 mLs by nebulization every 8 (eight) hours. And prn (Patient taking differently: Take 3 mLs by nebulization every 8 (eight) hours as needed (for shortness of breath or wheezing). ) 360 mL 11  . megestrol (MEGACE) 40 MG tablet Take 1 tablet (40 mg total) by mouth 2 (two) times daily. Jakin  tablet 0  . metoprolol (LOPRESSOR) 50 MG tablet Take 1 tablet (50 mg total) by mouth 2 (two) times daily. 60 tablet 2  . Multiple Vitamin (MULTI-VITAMINS) TABS Take 2 tablets by mouth daily.     Marland Kitchen oxyCODONE (OXY IR/ROXICODONE) 5 MG immediate release tablet Take 1 tablet (5 mg total) by mouth every 6 (six) hours as needed for severe pain. 30 tablet 0   No current facility-administered medications for this visit.      PHYSICAL EXAMINATION: ECOG PERFORMANCE STATUS: 3 - Symptomatic, >50% confined to bed Vitals:   12/08/18 1456  BP: 109/67  Pulse: 63  Resp: 18  Temp: (!) 97.3 F (36.3 C)  SpO2: 93%   Filed Weights    Physical Exam Constitutional:      General: He is not in acute distress.    Comments: Thin elderly male, chronic ill appearance, sitting in wheelchair. Cachectic.   HENT:     Head: Normocephalic and atraumatic.  Eyes:     General: No scleral icterus.    Pupils: Pupils are equal, round, and reactive to light.  Neck:     Musculoskeletal: Normal range of motion and neck supple.  Cardiovascular:     Rate and Rhythm: Normal rate and regular rhythm.     Heart sounds: Normal heart sounds.  Pulmonary:     Effort: Pulmonary effort is normal. No respiratory distress.     Breath sounds: No wheezing or rales.     Comments: Severely decreased breath sound bilaterally. Abdominal:     General: Bowel sounds are  normal. There is no distension.     Palpations: Abdomen is soft. There is no mass.     Tenderness: There is no abdominal tenderness.  Musculoskeletal: Normal range of motion.        General: No deformity.  Skin:    General: Skin is warm and dry.     Findings: No erythema or rash.  Neurological:     Mental Status: He is alert and oriented to person, place, and time.     Cranial Nerves: No cranial nerve deficit.     Coordination: Coordination normal.  Psychiatric:        Behavior: Behavior normal.        Thought Content: Thought content normal.      LABORATORY DATA:  I have reviewed the data as listed Lab Results  Component Value Date   WBC 10.5 12/08/2018   HGB 11.0 (L) 12/08/2018   HCT 34.8 (L) 12/08/2018   MCV 84.9 12/08/2018   PLT 306 12/08/2018   Recent Labs    10/08/18 1553 10/31/18 0926 12/08/18 1417  NA 136 137 140  K 4.6 4.1 4.7  CL 103 103 104  CO2 _0 GLUCOSE 102* 100* 87  BUN 25* 33* 29*  CREATININE 1.74* 1.55* 1.78*  CALCIUM 8.5* 8.4* 9.2  GFRNONAA 35* 40* 35*  GFRAA 40* 46* 40*  PROT 7.6 6.6 8.6*  ALBUMIN 3.1* 2.9* 3.5  AST _1 ALT _2 ALKPHOS 96 67 89  BILITOT 0.9 0.4 0.6  BILIDIR 0.3*  --   --   IBILI 0.6  --   --    Iron/TIBC/Ferritin/ %Sat No results found for: IRON, TIBC, FERRITIN, IRONPCTSAT    RADIOGRAPHIC STUDIES: I have personally reviewed the radiological images as listed and agreed with the findings in the report. # 10/08/2018 CT chest w contrast Central left lung mass in the suprahilar region extending into  the AP window measuring approximately 3.4 x 6 cm likely larger compared to the previous exams. Found to be hypermetabolic on previous PET-CT and likely represents bronchogenic carcinoma. Adjacent mediastinal adenopathy as described. Several small pulmonary nodules bilaterally which may be due to metastatic disease although could be seen with atypical infectious or inflammatory processes. Recommend attention on  follow-up.  Aneurysmal dilatation of the ascending thoracic aorta measuring 4.8 cm in AP diameter without significant change. Aneurysmal dilatation of the descending thoracic aorta measuring 4.8 cm in AP diameter. Ascending thoracic aortic aneurysm. Recommend semi-annual imaging followup by CTA or MRA and referral to cardiothoracic surgery if not already obtained. This recommendation follows 2010 ACCF/AHA/AATS/ACR/ASA/SCA/SCAI/SIR/STS/SVM Guidelines for the Diagnosis and Management of Patients With Thoracic Aortic Disease. Circulation. 2010; 121: I739-P844.   ASSESSMENT & PLAN:  1. Non-small cell cancer of left lung ([place])   2. Weight loss, abnormal   3. Dehydration   4. Cachexia (HCC)   5. Other fatigue    #Non-small cell lung cancer, T4N3, possible M1 disease if bilateral axillary and porta cava node hypermetabolic activity are counted.At least stage IIIc, or stage IV disease Currently on radiation.  Not candidate for concurrent chemotherapy due to poor performance status and multiple comorbidities.  Patient appears to be dehydrated due to poor oral intake.  Creatinine 1.78, slightly higher than his baseline.  Hemoglobin 11, higher than his baseline likely hemoconcentration. Recommend IV hydration.  Patient declined. Advised patient to increase oral intake as well as oral hydration.  voices understanding. Patient has Megace 40 mg twice daily and he is not taking it.  Advised patient to try Megace 40 mg and appetite stimulant can be further titrated.  Fatigue likely multifactorial, due to neoplasm burden, multiple comorbidities, check TSH.  Poor prognosis due to poor performance status.   Refer to dietitian.  Goal of care was previously discussed. He desires treatment.  Repeat CBC CMP in 1 week and return to the clinic for further evaluation.  Orders Placed This Encounter  Procedures  . CBC with Differential/Platelet    Standing Status:   Future    Standing Expiration Date:    12/08/2019  . Comprehensive metabolic panel    Standing Status:   Future    Standing Expiration Date:   12/08/2019  . Amb Referral to Nutrition and Diabetic Education    Referral Priority:   Routine    Referral Type:   Consultation    Referral Reason:   Specialty Services Required    Number of Visits Requested:   1    Return of visit: 1 week  Earlie Server, MD, PhD Hematology Oncology [facility] at [facility] Pager- [phone] 12/09/2018

## 2018-12-11 ENCOUNTER — Ambulatory Visit
Admission: RE | Admit: 2018-12-11 | Discharge: 2018-12-11 | Disposition: A | Discharge status: Home / Self Care | Payer: Medicare Other | Source: Ambulatory Visit | Attending: Radiation Oncology | Admitting: Radiation Oncology

## 2018-12-11 DIAGNOSIS — I110 Hypertensive heart disease with heart failure: Secondary | ICD-10-CM | POA: Diagnosis not present

## 2018-12-11 DIAGNOSIS — J449 Chronic obstructive pulmonary disease, unspecified: Secondary | ICD-10-CM | POA: Insufficient documentation

## 2018-12-11 DIAGNOSIS — Z510 Encounter for antineoplastic radiation therapy: Secondary | ICD-10-CM | POA: Diagnosis not present

## 2018-12-11 DIAGNOSIS — Z87891 Personal history of nicotine dependence: Secondary | ICD-10-CM | POA: Insufficient documentation

## 2018-12-11 DIAGNOSIS — C3492 Malignant neoplasm of unspecified part of left bronchus or lung: Secondary | ICD-10-CM | POA: Insufficient documentation

## 2018-12-11 DIAGNOSIS — I5022 Chronic systolic (congestive) heart failure: Secondary | ICD-10-CM | POA: Diagnosis not present

## 2018-12-12 ENCOUNTER — Ambulatory Visit
Admission: RE | Admit: 2018-12-12 | Discharge: 2018-12-12 | Disposition: A | Discharge status: Home / Self Care | Payer: Medicare Other | Source: Ambulatory Visit | Attending: Radiation Oncology | Admitting: Radiation Oncology

## 2018-12-12 DIAGNOSIS — Z510 Encounter for antineoplastic radiation therapy: Secondary | ICD-10-CM | POA: Diagnosis not present

## 2018-12-15 ENCOUNTER — Encounter: Payer: Self-pay | Admitting: Oncology

## 2018-12-15 ENCOUNTER — Inpatient Hospital Stay: Payer: Medicare Other

## 2018-12-15 ENCOUNTER — Ambulatory Visit
Admission: RE | Admit: 2018-12-15 | Discharge: 2018-12-15 | Disposition: A | Discharge status: Home / Self Care | Payer: Medicare Other | Source: Ambulatory Visit | Attending: Radiation Oncology | Admitting: Radiation Oncology

## 2018-12-15 ENCOUNTER — Inpatient Hospital Stay: Payer: Medicare Other | Attending: Oncology

## 2018-12-15 ENCOUNTER — Other Ambulatory Visit: Payer: Self-pay

## 2018-12-15 ENCOUNTER — Inpatient Hospital Stay (HOSPITAL_BASED_OUTPATIENT_CLINIC_OR_DEPARTMENT_OTHER): Payer: Medicare Other | Admitting: Oncology

## 2018-12-15 VITALS — BP 109/54 | HR 77 | Temp 95.9°F | Wt 127.1 lb

## 2018-12-15 DIAGNOSIS — I4891 Unspecified atrial fibrillation: Secondary | ICD-10-CM | POA: Insufficient documentation

## 2018-12-15 DIAGNOSIS — J449 Chronic obstructive pulmonary disease, unspecified: Secondary | ICD-10-CM

## 2018-12-15 DIAGNOSIS — I5022 Chronic systolic (congestive) heart failure: Secondary | ICD-10-CM

## 2018-12-15 DIAGNOSIS — R5383 Other fatigue: Secondary | ICD-10-CM

## 2018-12-15 DIAGNOSIS — Z79899 Other long term (current) drug therapy: Secondary | ICD-10-CM | POA: Insufficient documentation

## 2018-12-15 DIAGNOSIS — I110 Hypertensive heart disease with heart failure: Secondary | ICD-10-CM | POA: Diagnosis not present

## 2018-12-15 DIAGNOSIS — Z923 Personal history of irradiation: Secondary | ICD-10-CM | POA: Insufficient documentation

## 2018-12-15 DIAGNOSIS — C3492 Malignant neoplasm of unspecified part of left bronchus or lung: Secondary | ICD-10-CM

## 2018-12-15 DIAGNOSIS — C3412 Malignant neoplasm of upper lobe, left bronchus or lung: Secondary | ICD-10-CM | POA: Diagnosis not present

## 2018-12-15 DIAGNOSIS — Z87891 Personal history of nicotine dependence: Secondary | ICD-10-CM | POA: Diagnosis not present

## 2018-12-15 DIAGNOSIS — M25511 Pain in right shoulder: Secondary | ICD-10-CM | POA: Diagnosis not present

## 2018-12-15 DIAGNOSIS — R64 Cachexia: Secondary | ICD-10-CM | POA: Insufficient documentation

## 2018-12-15 DIAGNOSIS — E860 Dehydration: Secondary | ICD-10-CM

## 2018-12-15 DIAGNOSIS — Z7901 Long term (current) use of anticoagulants: Secondary | ICD-10-CM | POA: Insufficient documentation

## 2018-12-15 DIAGNOSIS — R634 Abnormal weight loss: Secondary | ICD-10-CM | POA: Diagnosis not present

## 2018-12-15 DIAGNOSIS — Z510 Encounter for antineoplastic radiation therapy: Secondary | ICD-10-CM | POA: Diagnosis not present

## 2018-12-15 LAB — CBC WITH DIFFERENTIAL/PLATELET
ABS IMMATURE GRANULOCYTES: 0.03 10*3/uL (ref 0.00–0.07)
Basophils Absolute: 0 10*3/uL (ref 0.0–0.1)
Basophils Relative: 0 %
EOS PCT: 10 %
Eosinophils Absolute: 0.9 10*3/uL — ABNORMAL HIGH (ref 0.0–0.5)
HEMATOCRIT: 32.8 % — AB (ref 39.0–52.0)
HEMOGLOBIN: 10.4 g/dL — AB (ref 13.0–17.0)
Immature Granulocytes: 0 %
LYMPHS PCT: 17 %
Lymphs Abs: 1.5 10*3/uL (ref 0.7–4.0)
MCH: 26.7 pg (ref 26.0–34.0)
MCHC: 31.7 g/dL (ref 30.0–36.0)
MCV: 84.3 fL (ref 80.0–100.0)
MONO ABS: 1 10*3/uL (ref 0.1–1.0)
Monocytes Relative: 11 %
NEUTROS ABS: 5.2 10*3/uL (ref 1.7–7.7)
Neutrophils Relative %: 62 %
Platelets: 265 10*3/uL (ref 150–400)
RBC: 3.89 MIL/uL — ABNORMAL LOW (ref 4.22–5.81)
RDW: 15.5 % (ref 11.5–15.5)
WBC: 8.6 10*3/uL (ref 4.0–10.5)
nRBC: 0 % (ref 0.0–0.2)

## 2018-12-15 LAB — COMPREHENSIVE METABOLIC PANEL
ALBUMIN: 3.3 g/dL — AB (ref 3.5–5.0)
ALT: 10 U/L (ref 0–44)
AST: 18 U/L (ref 15–41)
Alkaline Phosphatase: 78 U/L (ref 38–126)
Anion gap: 8 (ref 5–15)
BILIRUBIN TOTAL: 0.4 mg/dL (ref 0.3–1.2)
BUN: 27 mg/dL — AB (ref 8–23)
CHLORIDE: 109 mmol/L (ref 98–111)
CO2: 23 mmol/L (ref 22–32)
CREATININE: 1.57 mg/dL — AB (ref 0.61–1.24)
Calcium: 9 mg/dL (ref 8.9–10.3)
GFR calc Af Amer: 47 mL/min — ABNORMAL LOW (ref >60)
GFR, EST NON AFRICAN AMERICAN: 40 mL/min — AB (ref >60)
GLUCOSE: 86 mg/dL (ref 70–99)
POTASSIUM: 4.4 mmol/L (ref 3.5–5.1)
Sodium: 140 mmol/L (ref 135–145)
Total Protein: 8.2 g/dL — ABNORMAL HIGH (ref 6.5–8.1)

## 2018-12-15 MED ORDER — PREDNISONE 10 MG (21) PO TBPK: ORAL_TABLET | ORAL | 0 refills | Status: DC

## 2018-12-15 NOTE — Progress Notes (Signed)
Hematology/Oncology follow up note [facility] [phone] [phone]) [phone]   Patient Care Team: Alwyn Pea, NP as PCP - General (Family Medicine) Telford Nab, RN as Registered Nurse  REFERRING PROVIDER: Dr.Malinda  CHIEF COMPLAINTS/REASON FOR VISIT:  Evaluation of lung mass  HISTORY OF PRESENTING ILLNESS:  Patrick Harding is a  85 y.o.  male with PMH listed below who was referred to me for evaluation of lung mass.   Patient recently presented emergency room for evaluation of fever of 101, nonproductive cough and profound weakness. CT done in the emergency room 10/08/2018 showed central left lung mass in the suprahilar region extending into AP window measuring approximately 3 x 4 x 6 cm, larger compared to previous exams.   Patient has been seen by Dr. Felicie Morn previously for lung mass. Patient's previous image work-up includes PET scan done 06/18/2017 which showed hypermetabolic right hilar mass or indeterminate etiology.  Favor bronchogenic carcinoma or lymphoma.  Mild metabolic activity of mediastinal lymph nodes is indeterminate.  Resolution of right middle lobe nodularity suggest infectious or inflammatory process.  Hypermetabolic asymmetric endplate lytic lesion at S1 with metabolic activity appear.  Favor benign process but cannot exclude solitary metastasis. Patient was seen and evaluated by Dr. Felicie Morn and had 01/28/2018 EBUS guided needle biopsy taking and several lymph node stations.  Patient also had a left upper lobe transbronchial cytology brushing, washing/BAL, removal of mucous plug from both lungs. Biopsy pathology was negative.  Per Dr. Ashby Dawes, patient's case was discussed on tumor board. Consensus reached to repeat a PET scan in 3 to 4 months for reevaluation and possible rebiopsy.  Patient had called office consult his follow-up appointments for PET scan and had not seen Dr. Felicie Morn since then.  #Today patient was  accompanied by wife and daughter to clinic to discuss CT scan and management plan. Reports feeling weak and fatigued.  Appetite has decreased.  His weight appears stable since February this year. Lives at home with wife. Mild shortness of breath with exertion.  Per family pack, patient does not walk much.  History of stroke in 2017, residual right sided weakness.  History of chronic systolic CHF, Neihart class III, global LVEF 25% [12/08/2015 2D echo] On Eliquis 2.5 twice daily for anticoagulation for risk reduction of stroke with atrial fibrillation/flutter.  # S/p BUS biopsy.  Left upper lobe biopsy showed respiratory mucosa with areas of squamous metaplasia and rare atypical cells.  Left hilar mass: EBUS positive for malignancy. 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  Paratracheal mass: suspicious for malignancy.  Lung left upper lobe, BAL: non diagnostic.  Left upper lobe brushing negative for malignancy.    INTERVAL HISTORY Patrick Harding is a 85 y.o. male who has above history reviewed by me today presents for follow up visit for management of lung cancer and follow up on dehydration.   # Currently on Radiation treatment for Stage III lung cancer.  # Reports worsening of chest congestion and cough, coughing up whitish sputum.  # Appetite is slight better and he has increased oral hydration  Taking Megace 47m BID.  Continue to have weight loss.  # Right shoulder pain well controlled with current regimen with oxycodone 5 mg every 6 hours as needed.  Review of Systems  Constitutional: Positive for malaise/fatigue. Negative for chills, fever and weight loss.  HENT: Negative for nosebleeds and sore throat.   Eyes: Negative for double vision and redness.  Respiratory: Positive for cough and  shortness of breath. Negative for wheezing.   Cardiovascular: Negative for chest pain, palpitations, orthopnea and leg swelling.  Gastrointestinal: Negative  for abdominal pain, blood in stool, nausea and vomiting.  Genitourinary: Negative for dysuria and frequency.  Musculoskeletal: Negative for myalgias.       Right shoulder pain.   Skin: Negative for itching and rash.  Neurological: Positive for weakness. Negative for dizziness, tingling and tremors.  Endo/Heme/Allergies: Negative for environmental allergies. Does not bruise/bleed easily.  Psychiatric/Behavioral: Negative for hallucinations.    MEDICAL HISTORY:  Past Medical History:  Diagnosis Date  . Anemia   . Aortic aneurysm without rupture ([place]) 12/26/2015  . Arrhythmia   . Atrial fibrillation ([place])   . Benign neoplasm of cecum   . Benign neoplasm of transverse colon   . Blood in stool   . Cachexia ([place]) 05/11/2017  . CAP (community acquired pneumonia) 03/20/2016  . Chronic systolic CHF (congestive heart failure), NYHA class 3 ([place]) 12/26/2015   Overview:  Global ef 25%  . COPD (chronic obstructive pulmonary disease) ([place])   . Dysarthria due to recent stroke 03/12/2016  . Dyspnea on exertion 01/03/2016  . Dysrhythmia    atrial fib.  . Erectile dysfunction   . Essential hypertension 04/14/2015  . Gastritis   . Generalized weakness 05/11/2017  . Hyperlipidemia   . Hypertension   . Left elbow pain 05/11/2017  . Lower extremity weakness (Right) 01/24/2016  . 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 Lumbar facet syndrome 08/10/2015  . Lumbar foraminal stenosis (Left Moderate-severe L4-5) (Bilateral Moderate L5-S1) 03/12/2016  . Nonrheumatic aortic valve insufficiency 01/03/2016  . Panlobular emphysema ([place]) 01/03/2016  . Persistent atrial fibrillation 04/14/2015   Overview:  Last Assessment & Plan:  His ventricular rate continues to be elevated. Thus, I elected to increase the dose of metoprolol to 50 mg twice daily. Continue anticoagulation. He is complaining of increased dyspnea but his oxygen saturation is 95% on room air and his lungs are relatively clear but he does have diminished breath sounds.  EKG does not show any new changes. Once his heart rate i  . Pressure ulcer 03/21/2016  . Rectal polyp   . Reflux esophagitis   . Renal insufficiency   . Sacroiliac joint dysfunction 08/10/2015  . Stroke determined by clinical assessment ([place]) 03/12/2016  . Thoracic ascending aortic aneurysm ([place]) 01/11/2016   Overview:  4.8cm 12/2015  . Vitamin D deficiency   . Weakness of both legs 12/26/2015    SURGICAL HISTORY: Past Surgical History:  Procedure Laterality Date  . BACK SURGERY     lumbar  . COLONOSCOPY WITH PROPOFOL N/A 07/17/2016   Procedure: COLONOSCOPY WITH PROPOFOL;  Surgeon: [name], MD;  Location: [facility] ENDOSCOPY;  Service: Endoscopy;  Laterality: N/A;  . ENDOBRONCHIAL ULTRASOUND N/A 01/27/2018   Procedure: ENDOBRONCHIAL ULTRASOUND;  Surgeon: [name], MD;  Location: [facility] ORS;  Service: Pulmonary;  Laterality: N/A;  . ENDOBRONCHIAL ULTRASOUND Left 11/04/2018   Procedure: ENDOBRONCHIAL ULTRASOUND;  Surgeon: [name], MD;  Location: [facility] ORS;  Service: Pulmonary;  Laterality: Left;  . ESOPHAGOGASTRODUODENOSCOPY (EGD) WITH PROPOFOL N/A 07/17/2016   Procedure: ESOPHAGOGASTRODUODENOSCOPY (EGD) WITH PROPOFOL;  Surgeon: [name], MD;  Location: [facility] ENDOSCOPY;  Service: Endoscopy;  Laterality: N/A;  . EYE SURGERY     cataract right  . FACIAL FRACTURE SURGERY    . Head surgery Left   . TONSILLECTOMY      SOCIAL HISTORY: Social History   Socioeconomic History  . Marital status: Married    Spouse  name: Not on file  . Number of children: Not on file  . Years of education: Not on file  . Highest education level: Not on file  Occupational History  . Not on file  Social Needs  . Financial resource strain: Not on file  . Food insecurity:    Worry: Not on file    Inability: Not on file  . Transportation needs:    Medical: Not on file    Non-medical: Not on file  Tobacco Use  . Smoking status: Former Smoker    Packs/day: 0.50    Years: 20.00    Pack  years: 10.00    Types: Cigarettes    Last attempt to quit: 10/24/2015    Years since quitting: 3.1  . Smokeless tobacco: Never Used  Substance and Sexual Activity  . Alcohol use: No    Alcohol/week: 0.0 standard drinks    Comment: quit smoking about 4 years ago  . Drug use: No  . Sexual activity: Not on file  Lifestyle  . Physical activity:    Days per week: Not on file    Minutes per session: Not on file  . Stress: Not on file  Relationships  . Social connections:    Talks on phone: Not on file    Gets together: Not on file    Attends religious service: Not on file    Active member of club or organization: Not on file    Attends meetings of clubs or organizations: Not on file    Relationship status: Not on file  . Intimate partner violence:    Fear of current or ex partner: Not on file    Emotionally abused: Not on file    Physically abused: Not on file    Forced sexual activity: Not on file  Other Topics Concern  . Not on file  Social History Narrative  . Not on file    FAMILY HISTORY: Family History  Problem Relation Age of Onset  . Heart failure Mother   . Arthritis Father   . Bladder Cancer Neg Hx   . Kidney cancer Neg Hx   . Prolactinoma Neg Hx   . Prostate cancer Neg Hx     ALLERGIES:  is allergic to aspirin and penicillins.  MEDICATIONS:  Current Outpatient Medications  Medication Sig Dispense Refill  . acetaminophen (TYLENOL) 500 MG tablet Take 1,000 mg by mouth every 6 (six) hours as needed for mild pain or headache.    . AMBULATORY NON FORMULARY MEDICATION Medication Name: nebulizer and supplies DX:J43.1 1 each 0  . apixaban (ELIQUIS) 2.5 MG TABS tablet Take 1.25 mg by mouth 2 (two) times daily.     Marland Kitchen diltiazem (CARDIZEM CD) 180 MG 24 hr capsule Take 180 mg by mouth daily.    Marland Kitchen ipratropium-albuterol (DUONEB) 0.5-2.5 (3) MG/3ML SOLN Take 3 mLs by nebulization every 8 (eight) hours. And prn (Patient taking differently: Take 3 mLs by nebulization every  8 (eight) hours as needed (for shortness of breath or wheezing). ) 360 mL 11  . megestrol (MEGACE) 40 MG tablet Take 1 tablet (40 mg total) by mouth 2 (two) times daily. 60 tablet 0  . metoprolol (LOPRESSOR) 50 MG tablet Take 1 tablet (50 mg total) by mouth 2 (two) times daily. 60 tablet 2  . Multiple Vitamin (MULTI-VITAMINS) TABS Take 2 tablets by mouth daily.     Marland Kitchen oxyCODONE (OXY IR/ROXICODONE) 5 MG immediate release tablet Take 1 tablet (5 mg total) by mouth  every 6 (six) hours as needed for severe pain. 30 tablet 0  . predniSONE (STERAPRED UNI-PAK 21 TAB) 10 MG (21) TBPK tablet On day 1- take 6 tablets (10m),  Day 2- take 5 tablets (50 mg),  Day 3- take 4 tablets (427m,  Day 4- take 3 tablets (3072m  Day 5- take 2 tablets (20 mg), Day 6- take 1 tablet (10 mg) 21 tablet 0   No current facility-administered medications for this visit.      PHYSICAL EXAMINATION: ECOG PERFORMANCE STATUS: 3 - Symptomatic, >50% confined to bed Vitals:   12/15/18 1304  BP: (!) 109/54  Pulse: 77  Temp: (!) 95.9 F (35.5 C)  SpO2: 97%   Filed Weights   12/15/18 1304  Weight: 127 lb 2 oz (57.7 kg)    Physical Exam Constitutional:      General: He is not in acute distress.    Comments: Thin elderly male, chronic ill appearance, sitting in wheelchair. Cachectic.   HENT:     Head: Normocephalic and atraumatic.  Eyes:     General: No scleral icterus.    Pupils: Pupils are equal, round, and reactive to light.  Neck:     Musculoskeletal: Normal range of motion and neck supple.  Cardiovascular:     Rate and Rhythm: Normal rate and regular rhythm.     Heart sounds: Normal heart sounds.  Pulmonary:     Effort: Pulmonary effort is normal. No respiratory distress.     Breath sounds: No wheezing or rales.     Comments: Severely decreased breath sound bilaterally. Abdominal:     General: Bowel sounds are normal. There is no distension.     Palpations: Abdomen is soft. There is no mass.     Tenderness:  There is no abdominal tenderness.  Musculoskeletal: Normal range of motion.        General: No deformity.  Skin:    General: Skin is warm and dry.     Findings: No erythema or rash.  Neurological:     Mental Status: He is alert and oriented to person, place, and time.     Cranial Nerves: No cranial nerve deficit.     Coordination: Coordination normal.  Psychiatric:        Behavior: Behavior normal.        Thought Content: Thought content normal.      LABORATORY DATA:  I have reviewed the data as listed Lab Results  Component Value Date   WBC 8.6 12/15/2018   HGB 10.4 (L) 12/15/2018   HCT 32.8 (L) 12/15/2018   MCV 84.3 12/15/2018   PLT 265 12/15/2018   Recent Labs    10/08/18 1553 10/31/18 0926 12/08/18 1417 12/15/18 1210  NA 136 137 140 140  K 4.6 4.1 4.7 4.4  CL 103 103 104 109  CO2 _0 GLUCOSE 102* 100* 87 86  BUN 25* 33* 29* 27*  CREATININE 1.74* 1.55* 1.78* 1.57*  CALCIUM 8.5* 8.4* 9.2 9.0  GFRNONAA 35* 40* 35* 40*  GFRAA 40* 46* 40* 47*  PROT 7.6 6.6 8.6* 8.2*  ALBUMIN 3.1* 2.9* 3.5 3.3*  AST _1 ALT _2 ALKPHOS 96 67 89 78  BILITOT 0.9 0.4 0.6 0.4  BILIDIR 0.3*  --   --   --   IBILI 0.6  --   --   --    Iron/TIBC/Ferritin/ %Sat No results found for: IRON, TIBC, FERRITIN, IRONPCTSAT  RADIOGRAPHIC STUDIES: I have personally reviewed the radiological images as listed and agreed with the findings in the report. # 10/08/2018 CT chest w contrast Central left lung mass in the suprahilar region extending into the AP window measuring approximately 3.4 x 6 cm likely larger compared to the previous exams. Found to be hypermetabolic on previous PET-CT and likely represents bronchogenic carcinoma. Adjacent mediastinal adenopathy as described. Several small pulmonary nodules bilaterally which may be due to metastatic disease although could be seen with atypical infectious or inflammatory processes. Recommend attention on  follow-up.  Aneurysmal dilatation of the ascending thoracic aorta measuring 4.8 cm in AP diameter without significant change. Aneurysmal dilatation of the descending thoracic aorta measuring 4.8 cm in AP diameter. Ascending thoracic aortic aneurysm. Recommend semi-annual imaging followup by CTA or MRA and referral to cardiothoracic surgery if not already obtained. This recommendation follows 2010 ACCF/AHA/AATS/ACR/ASA/SCA/SCAI/SIR/STS/SVM Guidelines for the Diagnosis and Management of Patients With Thoracic Aortic Disease. Circulation. 2010; 121: Z610-R604.   ASSESSMENT & PLAN:  1. Non-small cell cancer of left lung ([place])   2. Cachexia ([place])   3. Weight loss, abnormal   4. Dehydration   5. Other fatigue   6. Chronic systolic congestive heart failure, NYHA class 3 ([facility])    #Non-small cell lung cancer, T4N3, possible M1 disease if bilateral axillary and porta cava node hypermetabolic activity are counted.At least stage IIIc, or stage IV disease Currently on radiation.  Not candidate for concurrent chemotherapy due to poor performance status and multiple comorbidities.  # Dehydration, clinically improved with oral hydration. Creatinine decreased to his baseline at 1.57.  # Cough/Bronchitis, Likely increased lung inflammation due to RT. No signs of infection.  Start trial of prednisone dose pack tapering course.  # Fatigue at baseline.  # Weight loss, he has been referred to dietitian. Continue Ensure supplements.  Poor prognosis due to poor performance status.   Repeat CBC CMP in 2 weeks and return to the clinic for further evaluation.  Return of visit:  Earlie Server, MD, PhD Hematology Oncology [facility] at [facility] Pager- [phone] 12/15/2018

## 2018-12-15 NOTE — Progress Notes (Signed)
Patient here today for follow up.  Patient c/o cough, congestion and no appetite.

## 2018-12-16 ENCOUNTER — Inpatient Hospital Stay: Payer: Medicare Other

## 2018-12-16 ENCOUNTER — Other Ambulatory Visit: Payer: Self-pay

## 2018-12-16 ENCOUNTER — Ambulatory Visit
Admission: RE | Admit: 2018-12-16 | Discharge: 2018-12-16 | Disposition: A | Discharge status: Home / Self Care | Payer: Medicare Other | Source: Ambulatory Visit | Attending: Radiation Oncology | Admitting: Radiation Oncology

## 2018-12-16 DIAGNOSIS — C3492 Malignant neoplasm of unspecified part of left bronchus or lung: Secondary | ICD-10-CM

## 2018-12-16 DIAGNOSIS — Z510 Encounter for antineoplastic radiation therapy: Secondary | ICD-10-CM | POA: Diagnosis not present

## 2018-12-16 LAB — CBC
HEMATOCRIT: 31.7 % — AB (ref 39.0–52.0)
Hemoglobin: 10.1 g/dL — ABNORMAL LOW (ref 13.0–17.0)
MCH: 26.9 pg (ref 26.0–34.0)
MCHC: 31.9 g/dL (ref 30.0–36.0)
MCV: 84.5 fL (ref 80.0–100.0)
NRBC: 0 % (ref 0.0–0.2)
Platelets: 280 10*3/uL (ref 150–400)
RBC: 3.75 MIL/uL — AB (ref 4.22–5.81)
RDW: 15.5 % (ref 11.5–15.5)
WBC: 8.5 10*3/uL (ref 4.0–10.5)

## 2018-12-17 ENCOUNTER — Other Ambulatory Visit: Payer: Medicare Other

## 2018-12-17 ENCOUNTER — Ambulatory Visit
Admission: RE | Admit: 2018-12-17 | Discharge: 2018-12-17 | Disposition: A | Discharge status: Home / Self Care | Payer: Medicare Other | Source: Ambulatory Visit | Attending: Radiation Oncology | Admitting: Radiation Oncology

## 2018-12-17 ENCOUNTER — Ambulatory Visit: Payer: Medicare Other | Admitting: Oncology

## 2018-12-17 ENCOUNTER — Ambulatory Visit: Payer: Medicare Other

## 2018-12-17 DIAGNOSIS — Z510 Encounter for antineoplastic radiation therapy: Secondary | ICD-10-CM | POA: Diagnosis not present

## 2018-12-18 ENCOUNTER — Ambulatory Visit
Admission: RE | Admit: 2018-12-18 | Discharge: 2018-12-18 | Disposition: A | Discharge status: Home / Self Care | Payer: Medicare Other | Source: Ambulatory Visit | Attending: Radiation Oncology | Admitting: Radiation Oncology

## 2018-12-18 ENCOUNTER — Inpatient Hospital Stay: Payer: Medicare Other

## 2018-12-18 DIAGNOSIS — Z510 Encounter for antineoplastic radiation therapy: Secondary | ICD-10-CM | POA: Diagnosis not present

## 2018-12-18 LAB — ACID FAST CULTURE WITH REFLEXED SENSITIVITIES: ACID FAST CULTURE - AFSCU3: NEGATIVE

## 2018-12-18 LAB — ACID FAST CULTURE WITH REFLEXED SENSITIVITIES (MYCOBACTERIA)

## 2018-12-18 NOTE — Progress Notes (Signed)
Nutrition Assessment   Reason for Assessment:  Referral for weight loss and poor appetite   ASSESSMENT:   85 year old male with lung cancer.  Patient receiving radiation therapy. Past medical history of afib, CHF, COPD, HLD, HTN.  Met with patient and wife following radiation therapy.  Patient reports no appetite.  Does not usually eat breakfast maybe will drink an ensure.  Reports gets up around 9:30-10am.  Reports then will eat again around 2-3pm.  Ate white beans yesterday and can't remember anything else that he ate.  Does not usually eat anything later in the evening per wife to help with indigestion.   Nutrition Focused Physical Exam: deferred   Medications: megace   Labs: BUN 27, creatinine 1.57   Anthropometrics:    Height: 72 inches Weight: 127 lb 2 oz UBW: 130-135 per wife over the last year BMI: 17  Noted 132 lb on 11/22 4% weight loss in the last month and half   Estimated Energy Needs  Kcals: 1740-2030 calories Protein: 87-101 g/d Fluid: 2.0 L/d   NUTRITION DIAGNOSIS: Inadequate oral intake related to cancer and cancer related treatment side effects   INTERVENTION:  Discussed ways to increase calories and protein in current eating pattern. Recommend increasing ensure to 2 per day.  Patient reports he could drink more than 1 if he could afford it.  1st case of ensure enlive given today Wife interested in smoothie recipes and recipes given from [facility].  Encouraged eating snack q 2 hours   MONITORING, EVALUATION, GOAL: Patient will consume adequate calories and protein to maintain weight   Next Visit: Feb 3rd after radiation  Jeraldin Fesler B. Zenia Resides, [place], [place] Registered Dietitian [phone] (pager)

## 2018-12-19 ENCOUNTER — Ambulatory Visit
Admission: RE | Admit: 2018-12-19 | Discharge: 2018-12-19 | Disposition: A | Discharge status: Home / Self Care | Payer: Medicare Other | Source: Ambulatory Visit | Attending: Radiation Oncology | Admitting: Radiation Oncology

## 2018-12-19 DIAGNOSIS — Z510 Encounter for antineoplastic radiation therapy: Secondary | ICD-10-CM | POA: Diagnosis not present

## 2018-12-22 ENCOUNTER — Ambulatory Visit
Admission: RE | Admit: 2018-12-22 | Discharge: 2018-12-22 | Disposition: A | Discharge status: Home / Self Care | Payer: Medicare Other | Source: Ambulatory Visit | Attending: Radiation Oncology | Admitting: Radiation Oncology

## 2018-12-22 DIAGNOSIS — Z510 Encounter for antineoplastic radiation therapy: Secondary | ICD-10-CM | POA: Diagnosis not present

## 2018-12-23 ENCOUNTER — Inpatient Hospital Stay: Payer: Medicare Other

## 2018-12-23 ENCOUNTER — Ambulatory Visit
Admission: RE | Admit: 2018-12-23 | Discharge: 2018-12-23 | Disposition: A | Discharge status: Home / Self Care | Payer: Medicare Other | Source: Ambulatory Visit | Attending: Radiation Oncology | Admitting: Radiation Oncology

## 2018-12-23 DIAGNOSIS — Z510 Encounter for antineoplastic radiation therapy: Secondary | ICD-10-CM | POA: Diagnosis not present

## 2018-12-23 DIAGNOSIS — C3492 Malignant neoplasm of unspecified part of left bronchus or lung: Secondary | ICD-10-CM | POA: Diagnosis not present

## 2018-12-23 LAB — CBC
HCT: 30.8 % — ABNORMAL LOW (ref 39.0–52.0)
Hemoglobin: 9.8 g/dL — ABNORMAL LOW (ref 13.0–17.0)
MCH: 26.8 pg (ref 26.0–34.0)
MCHC: 31.8 g/dL (ref 30.0–36.0)
MCV: 84.2 fL (ref 80.0–100.0)
Platelets: 225 10*3/uL (ref 150–400)
RBC: 3.66 MIL/uL — ABNORMAL LOW (ref 4.22–5.81)
RDW: 15.5 % (ref 11.5–15.5)
WBC: 8.6 10*3/uL (ref 4.0–10.5)
nRBC: 0 % (ref 0.0–0.2)

## 2018-12-24 ENCOUNTER — Ambulatory Visit
Admission: RE | Admit: 2018-12-24 | Discharge: 2018-12-24 | Disposition: A | Discharge status: Home / Self Care | Payer: Medicare Other | Source: Ambulatory Visit | Attending: Radiation Oncology | Admitting: Radiation Oncology

## 2018-12-24 DIAGNOSIS — Z510 Encounter for antineoplastic radiation therapy: Secondary | ICD-10-CM | POA: Diagnosis not present

## 2018-12-25 ENCOUNTER — Ambulatory Visit
Admission: RE | Admit: 2018-12-25 | Discharge: 2018-12-25 | Disposition: A | Discharge status: Home / Self Care | Payer: Medicare Other | Source: Ambulatory Visit | Attending: Radiation Oncology | Admitting: Radiation Oncology

## 2018-12-25 DIAGNOSIS — Z510 Encounter for antineoplastic radiation therapy: Secondary | ICD-10-CM | POA: Diagnosis not present

## 2018-12-26 ENCOUNTER — Ambulatory Visit
Admission: RE | Admit: 2018-12-26 | Discharge: 2018-12-26 | Disposition: A | Discharge status: Home / Self Care | Payer: Medicare Other | Source: Ambulatory Visit | Attending: Radiation Oncology | Admitting: Radiation Oncology

## 2018-12-26 DIAGNOSIS — Z510 Encounter for antineoplastic radiation therapy: Secondary | ICD-10-CM | POA: Diagnosis not present

## 2018-12-29 ENCOUNTER — Ambulatory Visit
Admission: RE | Admit: 2018-12-29 | Discharge: 2018-12-29 | Disposition: A | Discharge status: Home / Self Care | Payer: Medicare Other | Source: Ambulatory Visit | Attending: Radiation Oncology | Admitting: Radiation Oncology

## 2018-12-29 DIAGNOSIS — Z510 Encounter for antineoplastic radiation therapy: Secondary | ICD-10-CM | POA: Diagnosis not present

## 2018-12-30 ENCOUNTER — Ambulatory Visit: Payer: Medicare Other

## 2018-12-30 ENCOUNTER — Inpatient Hospital Stay: Payer: Medicare Other

## 2018-12-30 ENCOUNTER — Ambulatory Visit
Admission: RE | Admit: 2018-12-30 | Discharge: 2018-12-30 | Disposition: A | Discharge status: Home / Self Care | Payer: Medicare Other | Source: Ambulatory Visit | Attending: Radiation Oncology | Admitting: Radiation Oncology

## 2018-12-30 ENCOUNTER — Ambulatory Visit: Payer: Medicare Other | Admitting: Oncology

## 2018-12-30 ENCOUNTER — Other Ambulatory Visit: Payer: Self-pay

## 2018-12-30 DIAGNOSIS — C3492 Malignant neoplasm of unspecified part of left bronchus or lung: Secondary | ICD-10-CM

## 2018-12-30 DIAGNOSIS — Z510 Encounter for antineoplastic radiation therapy: Secondary | ICD-10-CM | POA: Diagnosis not present

## 2018-12-30 LAB — CBC
HCT: 31.7 % — ABNORMAL LOW (ref 39.0–52.0)
HEMOGLOBIN: 10 g/dL — AB (ref 13.0–17.0)
MCH: 26.8 pg (ref 26.0–34.0)
MCHC: 31.5 g/dL (ref 30.0–36.0)
MCV: 85 fL (ref 80.0–100.0)
PLATELETS: 237 10*3/uL (ref 150–400)
RBC: 3.73 MIL/uL — AB (ref 4.22–5.81)
RDW: 15.9 % — ABNORMAL HIGH (ref 11.5–15.5)
WBC: 7.3 10*3/uL (ref 4.0–10.5)
nRBC: 0 % (ref 0.0–0.2)

## 2018-12-31 ENCOUNTER — Ambulatory Visit
Admission: RE | Admit: 2018-12-31 | Discharge: 2018-12-31 | Disposition: A | Discharge status: Home / Self Care | Payer: Medicare Other | Source: Ambulatory Visit | Attending: Radiation Oncology | Admitting: Radiation Oncology

## 2018-12-31 ENCOUNTER — Inpatient Hospital Stay: Payer: Medicare Other

## 2018-12-31 ENCOUNTER — Inpatient Hospital Stay: Payer: Medicare Other | Admitting: Oncology

## 2018-12-31 DIAGNOSIS — Z510 Encounter for antineoplastic radiation therapy: Secondary | ICD-10-CM | POA: Diagnosis not present

## 2019-01-01 ENCOUNTER — Ambulatory Visit
Admission: RE | Admit: 2019-01-01 | Discharge: 2019-01-01 | Disposition: A | Discharge status: Home / Self Care | Payer: Medicare Other | Source: Ambulatory Visit | Attending: Radiation Oncology | Admitting: Radiation Oncology

## 2019-01-01 DIAGNOSIS — Z510 Encounter for antineoplastic radiation therapy: Secondary | ICD-10-CM | POA: Diagnosis not present

## 2019-01-02 ENCOUNTER — Ambulatory Visit
Admission: RE | Admit: 2019-01-02 | Discharge: 2019-01-02 | Disposition: A | Discharge status: Home / Self Care | Payer: Medicare Other | Source: Ambulatory Visit | Attending: Radiation Oncology | Admitting: Radiation Oncology

## 2019-01-02 DIAGNOSIS — Z510 Encounter for antineoplastic radiation therapy: Secondary | ICD-10-CM | POA: Diagnosis not present

## 2019-01-05 ENCOUNTER — Ambulatory Visit
Admission: RE | Admit: 2019-01-05 | Discharge: 2019-01-05 | Disposition: A | Discharge status: Home / Self Care | Payer: Medicare Other | Source: Ambulatory Visit | Attending: Radiation Oncology | Admitting: Radiation Oncology

## 2019-01-05 DIAGNOSIS — Z510 Encounter for antineoplastic radiation therapy: Secondary | ICD-10-CM | POA: Diagnosis not present

## 2019-01-06 ENCOUNTER — Inpatient Hospital Stay (HOSPITAL_BASED_OUTPATIENT_CLINIC_OR_DEPARTMENT_OTHER): Payer: Medicare Other | Admitting: Oncology

## 2019-01-06 ENCOUNTER — Inpatient Hospital Stay: Payer: Medicare Other

## 2019-01-06 ENCOUNTER — Other Ambulatory Visit: Payer: Self-pay

## 2019-01-06 ENCOUNTER — Encounter: Payer: Self-pay | Admitting: Oncology

## 2019-01-06 ENCOUNTER — Ambulatory Visit
Admission: RE | Admit: 2019-01-06 | Discharge: 2019-01-06 | Disposition: A | Discharge status: Home / Self Care | Payer: Medicare Other | Source: Ambulatory Visit | Attending: Radiation Oncology | Admitting: Radiation Oncology

## 2019-01-06 VITALS — BP 114/62 | HR 55 | Temp 96.5°F | Ht 72.0 in | Wt 127.5 lb

## 2019-01-06 DIAGNOSIS — Z87891 Personal history of nicotine dependence: Secondary | ICD-10-CM

## 2019-01-06 DIAGNOSIS — R64 Cachexia: Secondary | ICD-10-CM

## 2019-01-06 DIAGNOSIS — Z510 Encounter for antineoplastic radiation therapy: Secondary | ICD-10-CM | POA: Diagnosis not present

## 2019-01-06 DIAGNOSIS — M25511 Pain in right shoulder: Secondary | ICD-10-CM

## 2019-01-06 DIAGNOSIS — E860 Dehydration: Secondary | ICD-10-CM | POA: Diagnosis not present

## 2019-01-06 DIAGNOSIS — I5022 Chronic systolic (congestive) heart failure: Secondary | ICD-10-CM

## 2019-01-06 DIAGNOSIS — C3492 Malignant neoplasm of unspecified part of left bronchus or lung: Secondary | ICD-10-CM

## 2019-01-06 DIAGNOSIS — I110 Hypertensive heart disease with heart failure: Secondary | ICD-10-CM

## 2019-01-06 DIAGNOSIS — R634 Abnormal weight loss: Secondary | ICD-10-CM

## 2019-01-06 DIAGNOSIS — R5383 Other fatigue: Secondary | ICD-10-CM

## 2019-01-06 DIAGNOSIS — Z7901 Long term (current) use of anticoagulants: Secondary | ICD-10-CM

## 2019-01-06 DIAGNOSIS — I4891 Unspecified atrial fibrillation: Secondary | ICD-10-CM

## 2019-01-06 DIAGNOSIS — Z79899 Other long term (current) drug therapy: Secondary | ICD-10-CM

## 2019-01-06 DIAGNOSIS — Z923 Personal history of irradiation: Secondary | ICD-10-CM

## 2019-01-06 LAB — COMPREHENSIVE METABOLIC PANEL
ALBUMIN: 3.2 g/dL — AB (ref 3.5–5.0)
ALT: 10 U/L (ref 0–44)
AST: 16 U/L (ref 15–41)
Alkaline Phosphatase: 66 U/L (ref 38–126)
Anion gap: 7 (ref 5–15)
BILIRUBIN TOTAL: 0.4 mg/dL (ref 0.3–1.2)
BUN: 33 mg/dL — ABNORMAL HIGH (ref 8–23)
CHLORIDE: 110 mmol/L (ref 98–111)
CO2: 24 mmol/L (ref 22–32)
Calcium: 8.9 mg/dL (ref 8.9–10.3)
Creatinine, Ser: 1.39 mg/dL — ABNORMAL HIGH (ref 0.61–1.24)
GFR calc Af Amer: 54 mL/min — ABNORMAL LOW (ref >60)
GFR calc non Af Amer: 47 mL/min — ABNORMAL LOW (ref >60)
GLUCOSE: 96 mg/dL (ref 70–99)
POTASSIUM: 4.4 mmol/L (ref 3.5–5.1)
Sodium: 141 mmol/L (ref 135–145)
Total Protein: 7.9 g/dL (ref 6.5–8.1)

## 2019-01-06 LAB — CBC WITH DIFFERENTIAL/PLATELET
ABS IMMATURE GRANULOCYTES: 0.01 10*3/uL (ref 0.00–0.07)
Basophils Absolute: 0 10*3/uL (ref 0.0–0.1)
Basophils Relative: 0 %
Eosinophils Absolute: 1 10*3/uL — ABNORMAL HIGH (ref 0.0–0.5)
Eosinophils Relative: 16 %
HCT: 29.5 % — ABNORMAL LOW (ref 39.0–52.0)
HEMOGLOBIN: 9 g/dL — AB (ref 13.0–17.0)
Immature Granulocytes: 0 %
LYMPHS PCT: 8 %
Lymphs Abs: 0.4 10*3/uL — ABNORMAL LOW (ref 0.7–4.0)
MCH: 26.7 pg (ref 26.0–34.0)
MCHC: 30.5 g/dL (ref 30.0–36.0)
MCV: 87.5 fL (ref 80.0–100.0)
MONOS PCT: 12 %
Monocytes Absolute: 0.7 10*3/uL (ref 0.1–1.0)
NEUTROS ABS: 3.7 10*3/uL (ref 1.7–7.7)
Neutrophils Relative %: 64 %
PLATELETS: 231 10*3/uL (ref 150–400)
RBC: 3.37 MIL/uL — ABNORMAL LOW (ref 4.22–5.81)
RDW: 15.9 % — AB (ref 11.5–15.5)
WBC: 5.8 10*3/uL (ref 4.0–10.5)
nRBC: 0 % (ref 0.0–0.2)

## 2019-01-06 NOTE — Progress Notes (Signed)
Hematology/Oncology follow up note [facility] Telephone:([phone] [phone]   Patient Care Team: Alwyn Pea, NP as PCP - General (Family Medicine) Telford Nab, RN as Registered Nurse  REFERRING PROVIDER: Dr.Malinda  CHIEF COMPLAINTS/REASON FOR VISIT:  Evaluation of lung mass  HISTORY OF PRESENTING ILLNESS:  Patrick Harding is a  85 y.o.  male with PMH listed below who was referred to me for evaluation of lung mass.   Patient recently presented emergency room for evaluation of fever of 101, nonproductive cough and profound weakness. CT done in the emergency room 10/08/2018 showed central left lung mass in the suprahilar region extending into AP window measuring approximately 3 x 4 x 6 cm, larger compared to previous exams.   Patient has been seen by Dr. Felicie Morn previously for lung mass. Patient's previous image work-up includes PET scan done 06/18/2017 which showed hypermetabolic right hilar mass or indeterminate etiology.  Favor bronchogenic carcinoma or lymphoma.  Mild metabolic activity of mediastinal lymph nodes is indeterminate.  Resolution of right middle lobe nodularity suggest infectious or inflammatory process.  Hypermetabolic asymmetric endplate lytic lesion at S1 with metabolic activity appear.  Favor benign process but cannot exclude solitary metastasis. Patient was seen and evaluated by Dr. Felicie Morn and had 01/28/2018 EBUS guided needle biopsy taking and several lymph node stations.  Patient also had a left upper lobe transbronchial cytology brushing, washing/BAL, removal of mucous plug from both lungs. Biopsy pathology was negative.  Per Dr. Ashby Dawes, patient's case was discussed on tumor board. Consensus reached to repeat a PET scan in 3 to 4 months for reevaluation and possible rebiopsy.  Patient had called office consult his follow-up appointments for PET scan and had not seen Dr. Felicie Morn since then.  #Today patient was  accompanied by wife and daughter to clinic to discuss CT scan and management plan. Reports feeling weak and fatigued.  Appetite has decreased.  His weight appears stable since February this year. Lives at home with wife. Mild shortness of breath with exertion.  Per family pack, patient does not walk much.  History of stroke in 2017, residual right sided weakness.  History of chronic systolic CHF, Neihart class III, global LVEF 25% [12/08/2015 2D echo] On Eliquis 2.5 twice daily for anticoagulation for risk reduction of stroke with atrial fibrillation/flutter.  # S/p BUS biopsy.  Left upper lobe biopsy showed respiratory mucosa with areas of squamous metaplasia and rare atypical cells.  Left hilar mass: EBUS positive for malignancy. 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  Paratracheal mass: suspicious for malignancy.  Lung left upper lobe, BAL: non diagnostic.  Left upper lobe brushing negative for malignancy.    INTERVAL HISTORY Patrick Harding is a 85 y.o. male who has above history reviewed by me today presents for follow up visit for management of lung cancer. Patient is currently on radiation treatment for stage III lung cancer. He received IV fluid 2 weeks ago for dehydration treatments. Today he feels better, " recovered from cold symptoms". Chronic fatigue at baseline. Family member reports that patient has not been taking Megace 40 mg twice daily for appetite stimulants. Weight remains stable compared to 2 weeks ago.  Right shoulder pain well controlled with current regimen of oxycodone 5 mg every 6 hours as needed. Chronic shortness of breath at baseline.  Review of Systems  Constitutional: Positive for malaise/fatigue. Negative for chills, fever and weight loss.  HENT: Negative for nosebleeds and sore throat.   Eyes: Negative  for double vision and redness.  Respiratory: Positive for cough and shortness of breath. Negative for wheezing.     Cardiovascular: Negative for chest pain, palpitations, orthopnea and leg swelling.  Gastrointestinal: Negative for abdominal pain, blood in stool, nausea and vomiting.  Genitourinary: Negative for dysuria and frequency.  Musculoskeletal: Negative for myalgias.       Right shoulder pain.   Skin: Negative for itching and rash.  Neurological: Negative for dizziness, tingling, tremors and weakness.  Endo/Heme/Allergies: Negative for environmental allergies. Does not bruise/bleed easily.  Psychiatric/Behavioral: Negative for hallucinations.    MEDICAL HISTORY:  Past Medical History:  Diagnosis Date  . Anemia   . Aortic aneurysm without rupture ([place]) 12/26/2015  . Arrhythmia   . Atrial fibrillation ([place])   . Benign neoplasm of cecum   . Benign neoplasm of transverse colon   . Blood in stool   . Cachexia ([place]) 05/11/2017  . CAP (community acquired pneumonia) 03/20/2016  . Chronic systolic CHF (congestive heart failure), NYHA class 3 ([place]) 12/26/2015   Overview:  Global ef 25%  . COPD (chronic obstructive pulmonary disease) ([place])   . Dysarthria due to recent stroke 03/12/2016  . Dyspnea on exertion 01/03/2016  . Dysrhythmia    atrial fib.  . Erectile dysfunction   . Essential hypertension 04/14/2015  . Gastritis   . Generalized weakness 05/11/2017  . Hyperlipidemia   . Hypertension   . Left elbow pain 05/11/2017  . Lower extremity weakness (Right) 01/24/2016  . 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 Lumbar facet syndrome 08/10/2015  . Lumbar foraminal stenosis (Left Moderate-severe L4-5) (Bilateral Moderate L5-S1) 03/12/2016  . Nonrheumatic aortic valve insufficiency 01/03/2016  . Panlobular emphysema ([place]) 01/03/2016  . Persistent atrial fibrillation 04/14/2015   Overview:  Last Assessment & Plan:  His ventricular rate continues to be elevated. Thus, I elected to increase the dose of metoprolol to 50 mg twice daily. Continue anticoagulation. He is complaining of increased dyspnea but his oxygen  saturation is 95% on room air and his lungs are relatively clear but he does have diminished breath sounds. EKG does not show any new changes. Once his heart rate i  . Pressure ulcer 03/21/2016  . Rectal polyp   . Reflux esophagitis   . Renal insufficiency   . Sacroiliac joint dysfunction 08/10/2015  . Stroke determined by clinical assessment ([place]) 03/12/2016  . Thoracic ascending aortic aneurysm ([place]) 01/11/2016   Overview:  4.8cm 12/2015  . Vitamin D deficiency   . Weakness of both legs 12/26/2015    SURGICAL HISTORY: Past Surgical History:  Procedure Laterality Date  . BACK SURGERY     lumbar  . COLONOSCOPY WITH PROPOFOL N/A 07/17/2016   Procedure: COLONOSCOPY WITH PROPOFOL;  Surgeon: [name], MD;  Location: [facility] ENDOSCOPY;  Service: Endoscopy;  Laterality: N/A;  . ENDOBRONCHIAL ULTRASOUND N/A 01/27/2018   Procedure: ENDOBRONCHIAL ULTRASOUND;  Surgeon: [name], MD;  Location: [facility] ORS;  Service: Pulmonary;  Laterality: N/A;  . ENDOBRONCHIAL ULTRASOUND Left 11/04/2018   Procedure: ENDOBRONCHIAL ULTRASOUND;  Surgeon: [name], MD;  Location: [facility] ORS;  Service: Pulmonary;  Laterality: Left;  . ESOPHAGOGASTRODUODENOSCOPY (EGD) WITH PROPOFOL N/A 07/17/2016   Procedure: ESOPHAGOGASTRODUODENOSCOPY (EGD) WITH PROPOFOL;  Surgeon: [name], MD;  Location: [facility] ENDOSCOPY;  Service: Endoscopy;  Laterality: N/A;  . EYE SURGERY     cataract right  . FACIAL FRACTURE SURGERY    . Head surgery Left   . TONSILLECTOMY      SOCIAL HISTORY: Social History   Socioeconomic  History  . Marital status: Married    Spouse name: Not on file  . Number of children: Not on file  . Years of education: Not on file  . Highest education level: Not on file  Occupational History  . Not on file  Social Needs  . Financial resource strain: Not on file  . Food insecurity:    Worry: Not on file    Inability: Not on file  . Transportation needs:    Medical: Not on file    Non-medical:  Not on file  Tobacco Use  . Smoking status: Former Smoker    Packs/day: 0.50    Years: 20.00    Pack years: 10.00    Types: Cigarettes    Last attempt to quit: 10/24/2015    Years since quitting: 3.2  . Smokeless tobacco: Never Used  Substance and Sexual Activity  . Alcohol use: No    Alcohol/week: 0.0 standard drinks    Comment: quit smoking about 4 years ago  . Drug use: No  . Sexual activity: Not on file  Lifestyle  . Physical activity:    Days per week: Not on file    Minutes per session: Not on file  . Stress: Not on file  Relationships  . Social connections:    Talks on phone: Not on file    Gets together: Not on file    Attends religious service: Not on file    Active member of club or organization: Not on file    Attends meetings of clubs or organizations: Not on file    Relationship status: Not on file  . Intimate partner violence:    Fear of current or ex partner: Not on file    Emotionally abused: Not on file    Physically abused: Not on file    Forced sexual activity: Not on file  Other Topics Concern  . Not on file  Social History Narrative  . Not on file    FAMILY HISTORY: Family History  Problem Relation Age of Onset  . Heart failure Mother   . Arthritis Father   . Bladder Cancer Neg Hx   . Kidney cancer Neg Hx   . Prolactinoma Neg Hx   . Prostate cancer Neg Hx     ALLERGIES:  is allergic to aspirin and penicillins.  MEDICATIONS:  Current Outpatient Medications  Medication Sig Dispense Refill  . acetaminophen (TYLENOL) 500 MG tablet Take 1,000 mg by mouth every 6 (six) hours as needed for mild pain or headache.    . AMBULATORY NON FORMULARY MEDICATION Medication Name: nebulizer and supplies DX:J43.1 1 each 0  . apixaban (ELIQUIS) 2.5 MG TABS tablet Take 1.25 mg by mouth 2 (two) times daily.     Marland Kitchen diltiazem (CARDIZEM CD) 180 MG 24 hr capsule Take 180 mg by mouth daily.    Marland Kitchen ipratropium-albuterol (DUONEB) 0.5-2.5 (3) MG/3ML SOLN Take 3 mLs by  nebulization every 8 (eight) hours. And prn (Patient taking differently: Take 3 mLs by nebulization every 8 (eight) hours as needed (for shortness of breath or wheezing). ) 360 mL 11  . megestrol (MEGACE) 40 MG tablet Take 1 tablet (40 mg total) by mouth 2 (two) times daily. 60 tablet 0  . metoprolol (LOPRESSOR) 50 MG tablet Take 1 tablet (50 mg total) by mouth 2 (two) times daily. 60 tablet 2  . Multiple Vitamin (MULTI-VITAMINS) TABS Take 2 tablets by mouth daily.     Marland Kitchen oxyCODONE (OXY IR/ROXICODONE) 5 MG immediate  release tablet Take 1 tablet (5 mg total) by mouth every 6 (six) hours as needed for severe pain. 30 tablet 0  . predniSONE (STERAPRED UNI-PAK 21 TAB) 10 MG (21) TBPK tablet On day 1- take 6 tablets (44m),  Day 2- take 5 tablets (50 mg),  Day 3- take 4 tablets (468m,  Day 4- take 3 tablets (3064m  Day 5- take 2 tablets (20 mg), Day 6- take 1 tablet (10 mg) 21 tablet 0   No current facility-administered medications for this visit.      PHYSICAL EXAMINATION: ECOG PERFORMANCE STATUS: 3 - Symptomatic, >50% confined to bed Vitals:   01/06/19 1350  BP: 114/62  Pulse: (!) 55  Temp: (!) 96.5 F (35.8 C)   Filed Weights   01/06/19 1350  Weight: 127 lb 8 oz (57.8 kg)    Physical Exam Constitutional:      General: He is not in acute distress.    Comments: Thin elderly male, chronic ill appearance, sitting in wheelchair. Cachectic.   HENT:     Head: Normocephalic and atraumatic.  Eyes:     General: No scleral icterus.    Pupils: Pupils are equal, round, and reactive to light.  Neck:     Musculoskeletal: Normal range of motion and neck supple.  Cardiovascular:     Rate and Rhythm: Normal rate and regular rhythm.     Heart sounds: Normal heart sounds.  Pulmonary:     Effort: Pulmonary effort is normal. No respiratory distress.     Breath sounds: No wheezing or rales.     Comments: Severely decreased breath sound bilaterally. Abdominal:     General: Bowel sounds are  normal. There is no distension.     Palpations: Abdomen is soft. There is no mass.     Tenderness: There is no abdominal tenderness.  Musculoskeletal: Normal range of motion.        General: No deformity.  Skin:    General: Skin is warm and dry.     Findings: No erythema or rash.  Neurological:     Mental Status: He is alert and oriented to person, place, and time.     Cranial Nerves: No cranial nerve deficit.     Coordination: Coordination normal.  Psychiatric:        Behavior: Behavior normal.        Thought Content: Thought content normal.      LABORATORY DATA:  I have reviewed the data as listed Lab Results  Component Value Date   WBC 5.8 01/06/2019   HGB 9.0 (L) 01/06/2019   HCT 29.5 (L) 01/06/2019   MCV 87.5 01/06/2019   PLT 231 01/06/2019   Recent Labs    10/08/18 1553  12/08/18 1417 12/15/18 1210 01/06/19 1235  NA 136   < > 140 140 141  K 4.6   < > 4.7 4.4 4.4  CL 103   < > 104 109 110  CO2 26   < > _0 GLUCOSE 102*   < > 87 86 96  BUN 25*   < > 29* 27* 33*  CREATININE 1.74*   < > 1.78* 1.57* 1.39*  CALCIUM 8.5*   < > 9.2 9.0 8.9  GFRNONAA 35*   < > 35* 40* 47*  GFRAA 40*   < > 40* 47* 54*  PROT 7.6   < > 8.6* 8.2* 7.9  ALBUMIN 3.1*   < > 3.5 3.3* 3.2*  AST 23   < >  _0 ALT 18   < > _1 ALKPHOS 96   < > 89 78 66  BILITOT 0.9   < > 0.6 0.4 0.4  BILIDIR 0.3*  --   --   --   --   IBILI 0.6  --   --   --   --    < > = values in this interval not displayed.   Iron/TIBC/Ferritin/ %Sat No results found for: IRON, TIBC, FERRITIN, IRONPCTSAT    RADIOGRAPHIC STUDIES: I have personally reviewed the radiological images as listed and agreed with the findings in the report. # 10/08/2018 CT chest w contrast Central left lung mass in the suprahilar region extending into the AP window measuring approximately 3.4 x 6 cm likely larger compared to the previous exams. Found to be hypermetabolic on previous PET-CT and likely represents bronchogenic  carcinoma. Adjacent mediastinal adenopathy as described. Several small pulmonary nodules bilaterally which may be due to metastatic disease although could be seen with atypical infectious or inflammatory processes. Recommend attention on follow-up.  Aneurysmal dilatation of the ascending thoracic aorta measuring 4.8 cm in AP diameter without significant change. Aneurysmal dilatation of the descending thoracic aorta measuring 4.8 cm in AP diameter. Ascending thoracic aortic aneurysm. Recommend semi-annual imaging followup by CTA or MRA and referral to cardiothoracic surgery if not already obtained. This recommendation follows 2010 ACCF/AHA/AATS/ACR/ASA/SCA/SCAI/SIR/STS/SVM Guidelines for the Diagnosis and Management of Patients With Thoracic Aortic Disease. Circulation. 2010; 121: O756-E332.   ASSESSMENT & PLAN:  1. Non-small cell cancer of left lung (HCC)    #Non-small cell lung cancer, T4N3, possible M1 disease if bilateral axillary and porta cava node hypermetabolic activity are counted.At least stage IIIc, or stage IV disease Currently on radiation.  Not candidate for concurrent chemotherapy due to poor performance status and multiple comorbidities. Patient appears to compared to 2 weeks ago. Labs are reviewed and discussed with patient.  Creatinine improved to 1.39 which is his baseline. Hold additional IV hydration. Advised patient to continue oral hydration. Continue follow-up with radiation oncology for radiation.  Chronic fatigue at baseline.  Anticipate fatigue level to increase as hemoglobin trended down due to radiation. Cough and bronchitis have resolved. Weight loss/cachexia, he follows up with dietitian.  Weight has been stable since 2 weeks ago Poor prognosis due to poor performance status.  We discussed about repeating image 4 weeks after he finishes radiation.  May consider starting him on maintenance immunotherapy.  Repeat CBC CMP in 2 weeks and return to the clinic for  further evaluation.  Return of visit:  Earlie Server, MD, PhD Hematology Oncology [facility] Pager- [phone] 01/06/2019

## 2019-01-07 ENCOUNTER — Ambulatory Visit
Admission: RE | Admit: 2019-01-07 | Discharge: 2019-01-07 | Disposition: A | Discharge status: Home / Self Care | Payer: Medicare Other | Source: Ambulatory Visit | Attending: Radiation Oncology | Admitting: Radiation Oncology

## 2019-01-07 DIAGNOSIS — Z510 Encounter for antineoplastic radiation therapy: Secondary | ICD-10-CM | POA: Diagnosis not present

## 2019-01-08 ENCOUNTER — Ambulatory Visit
Admission: RE | Admit: 2019-01-08 | Discharge: 2019-01-08 | Disposition: A | Discharge status: Home / Self Care | Payer: Medicare Other | Source: Ambulatory Visit | Attending: Radiation Oncology | Admitting: Radiation Oncology

## 2019-01-08 DIAGNOSIS — Z510 Encounter for antineoplastic radiation therapy: Secondary | ICD-10-CM | POA: Diagnosis not present

## 2019-01-09 ENCOUNTER — Ambulatory Visit
Admission: RE | Admit: 2019-01-09 | Discharge: 2019-01-09 | Disposition: A | Discharge status: Home / Self Care | Payer: Medicare Other | Source: Ambulatory Visit | Attending: Radiation Oncology | Admitting: Radiation Oncology

## 2019-01-09 DIAGNOSIS — Z510 Encounter for antineoplastic radiation therapy: Secondary | ICD-10-CM | POA: Diagnosis not present

## 2019-01-12 ENCOUNTER — Ambulatory Visit
Admission: RE | Admit: 2019-01-12 | Discharge: 2019-01-12 | Disposition: A | Discharge status: Home / Self Care | Payer: Medicare Other | Source: Ambulatory Visit | Attending: Radiation Oncology | Admitting: Radiation Oncology

## 2019-01-12 ENCOUNTER — Inpatient Hospital Stay: Payer: Medicare Other | Attending: Oncology

## 2019-01-12 DIAGNOSIS — Z79899 Other long term (current) drug therapy: Secondary | ICD-10-CM | POA: Insufficient documentation

## 2019-01-12 DIAGNOSIS — C3492 Malignant neoplasm of unspecified part of left bronchus or lung: Secondary | ICD-10-CM | POA: Insufficient documentation

## 2019-01-12 DIAGNOSIS — R634 Abnormal weight loss: Secondary | ICD-10-CM | POA: Insufficient documentation

## 2019-01-12 DIAGNOSIS — I110 Hypertensive heart disease with heart failure: Secondary | ICD-10-CM | POA: Diagnosis not present

## 2019-01-12 DIAGNOSIS — Z87891 Personal history of nicotine dependence: Secondary | ICD-10-CM | POA: Diagnosis not present

## 2019-01-12 DIAGNOSIS — I5022 Chronic systolic (congestive) heart failure: Secondary | ICD-10-CM | POA: Insufficient documentation

## 2019-01-12 DIAGNOSIS — E860 Dehydration: Secondary | ICD-10-CM | POA: Insufficient documentation

## 2019-01-12 DIAGNOSIS — J449 Chronic obstructive pulmonary disease, unspecified: Secondary | ICD-10-CM | POA: Diagnosis not present

## 2019-01-12 DIAGNOSIS — Z510 Encounter for antineoplastic radiation therapy: Secondary | ICD-10-CM | POA: Insufficient documentation

## 2019-01-12 DIAGNOSIS — R64 Cachexia: Secondary | ICD-10-CM | POA: Insufficient documentation

## 2019-01-12 DIAGNOSIS — R5383 Other fatigue: Secondary | ICD-10-CM | POA: Insufficient documentation

## 2019-01-12 DIAGNOSIS — C3412 Malignant neoplasm of upper lobe, left bronchus or lung: Secondary | ICD-10-CM | POA: Insufficient documentation

## 2019-01-12 NOTE — Progress Notes (Signed)
Nutrition Follow-up:  Patient with lung cancer.  Patient receiving radiation therapy and due to complete treatment on 2/13.  Followed by Dr. Tasia Catchings.  Met with patient and wife today.  Patient reports that he is not taking megace on regular basis. "I take it when I feel like it."  "I have too many pills already to take.  Reports some nausea this am but did not take anything to help except swallow of pepsi.  Reports has not had anything to eat yet today.  Reports he is going to eat when he gets home.   Denies trouble swallowing foods or any other nutrition impact symptoms.    Medications: reviewed  Labs: reviewed  Anthropometrics:   Weight stable at 127 lb 8 oz on 1/28 same as 127 lb on 1/9   NUTRITION DIAGNOSIS: Inadequate oral intake stable   INTERVENTION:  Reviewed ways to increase calories and protein. Made suggestions on ways to add calories to current eating pattern.  Patient resistant to suggestions 2nd case of ensure enlive given to patient today along with coupons. Will continue to drink 2-3 per day     MONITORING, EVALUATION, GOAL: weight trends, intake   NEXT VISIT: Feb 13 following radiation treatment  Latisia Hilaire B. Zenia Resides, [facility], [place] Registered Dietitian [phone] (pager)

## 2019-01-13 ENCOUNTER — Ambulatory Visit
Admission: RE | Admit: 2019-01-13 | Discharge: 2019-01-13 | Disposition: A | Discharge status: Home / Self Care | Payer: Medicare Other | Source: Ambulatory Visit | Attending: Radiation Oncology | Admitting: Radiation Oncology

## 2019-01-13 ENCOUNTER — Inpatient Hospital Stay: Payer: Medicare Other

## 2019-01-13 DIAGNOSIS — Z510 Encounter for antineoplastic radiation therapy: Secondary | ICD-10-CM | POA: Diagnosis not present

## 2019-01-13 DIAGNOSIS — E860 Dehydration: Secondary | ICD-10-CM | POA: Diagnosis not present

## 2019-01-13 DIAGNOSIS — R634 Abnormal weight loss: Secondary | ICD-10-CM | POA: Diagnosis not present

## 2019-01-13 DIAGNOSIS — R5383 Other fatigue: Secondary | ICD-10-CM | POA: Diagnosis not present

## 2019-01-13 DIAGNOSIS — C3412 Malignant neoplasm of upper lobe, left bronchus or lung: Secondary | ICD-10-CM | POA: Diagnosis not present

## 2019-01-13 DIAGNOSIS — Z79899 Other long term (current) drug therapy: Secondary | ICD-10-CM | POA: Diagnosis not present

## 2019-01-13 DIAGNOSIS — R64 Cachexia: Secondary | ICD-10-CM | POA: Diagnosis not present

## 2019-01-13 DIAGNOSIS — C3492 Malignant neoplasm of unspecified part of left bronchus or lung: Secondary | ICD-10-CM

## 2019-01-13 LAB — CBC
HCT: 30.6 % — ABNORMAL LOW (ref 39.0–52.0)
HEMOGLOBIN: 9.5 g/dL — AB (ref 13.0–17.0)
MCH: 26.9 pg (ref 26.0–34.0)
MCHC: 31 g/dL (ref 30.0–36.0)
MCV: 86.7 fL (ref 80.0–100.0)
NRBC: 0 % (ref 0.0–0.2)
Platelets: 218 10*3/uL (ref 150–400)
RBC: 3.53 MIL/uL — ABNORMAL LOW (ref 4.22–5.81)
RDW: 16 % — AB (ref 11.5–15.5)
WBC: 6.2 10*3/uL (ref 4.0–10.5)

## 2019-01-14 ENCOUNTER — Ambulatory Visit
Admission: RE | Admit: 2019-01-14 | Discharge: 2019-01-14 | Disposition: A | Discharge status: Home / Self Care | Payer: Medicare Other | Source: Ambulatory Visit | Attending: Radiation Oncology | Admitting: Radiation Oncology

## 2019-01-14 DIAGNOSIS — Z510 Encounter for antineoplastic radiation therapy: Secondary | ICD-10-CM | POA: Diagnosis not present

## 2019-01-15 ENCOUNTER — Ambulatory Visit
Admission: RE | Admit: 2019-01-15 | Discharge: 2019-01-15 | Disposition: A | Discharge status: Home / Self Care | Payer: Medicare Other | Source: Ambulatory Visit | Attending: Radiation Oncology | Admitting: Radiation Oncology

## 2019-01-15 DIAGNOSIS — Z510 Encounter for antineoplastic radiation therapy: Secondary | ICD-10-CM | POA: Diagnosis not present

## 2019-01-16 ENCOUNTER — Ambulatory Visit
Admission: RE | Admit: 2019-01-16 | Discharge: 2019-01-16 | Disposition: A | Discharge status: Home / Self Care | Payer: Medicare Other | Source: Ambulatory Visit | Attending: Radiation Oncology | Admitting: Radiation Oncology

## 2019-01-16 DIAGNOSIS — Z510 Encounter for antineoplastic radiation therapy: Secondary | ICD-10-CM | POA: Diagnosis not present

## 2019-01-19 ENCOUNTER — Other Ambulatory Visit: Payer: Self-pay

## 2019-01-19 ENCOUNTER — Inpatient Hospital Stay: Payer: Medicare Other

## 2019-01-19 ENCOUNTER — Ambulatory Visit
Admission: RE | Admit: 2019-01-19 | Discharge: 2019-01-19 | Disposition: A | Discharge status: Home / Self Care | Payer: Medicare Other | Source: Ambulatory Visit | Attending: Radiation Oncology | Admitting: Radiation Oncology

## 2019-01-19 ENCOUNTER — Encounter: Payer: Self-pay | Admitting: Oncology

## 2019-01-19 ENCOUNTER — Inpatient Hospital Stay (HOSPITAL_BASED_OUTPATIENT_CLINIC_OR_DEPARTMENT_OTHER): Payer: Medicare Other | Admitting: Oncology

## 2019-01-19 VITALS — BP 96/52 | HR 83 | Temp 95.1°F | Resp 18

## 2019-01-19 DIAGNOSIS — C3492 Malignant neoplasm of unspecified part of left bronchus or lung: Secondary | ICD-10-CM

## 2019-01-19 DIAGNOSIS — C3412 Malignant neoplasm of upper lobe, left bronchus or lung: Secondary | ICD-10-CM | POA: Diagnosis not present

## 2019-01-19 DIAGNOSIS — R5383 Other fatigue: Secondary | ICD-10-CM

## 2019-01-19 DIAGNOSIS — R64 Cachexia: Secondary | ICD-10-CM | POA: Diagnosis not present

## 2019-01-19 DIAGNOSIS — E860 Dehydration: Secondary | ICD-10-CM

## 2019-01-19 DIAGNOSIS — R634 Abnormal weight loss: Secondary | ICD-10-CM | POA: Diagnosis not present

## 2019-01-19 DIAGNOSIS — Z510 Encounter for antineoplastic radiation therapy: Secondary | ICD-10-CM | POA: Diagnosis not present

## 2019-01-19 NOTE — Progress Notes (Signed)
Patient here for follow up. States "im sick."  Complains of pain "everywhere" and requests to lay down for pain relief. Appetite is poor.

## 2019-01-20 ENCOUNTER — Ambulatory Visit
Admission: RE | Admit: 2019-01-20 | Discharge: 2019-01-20 | Disposition: A | Discharge status: Home / Self Care | Payer: Medicare Other | Source: Ambulatory Visit | Attending: Radiation Oncology | Admitting: Radiation Oncology

## 2019-01-20 ENCOUNTER — Inpatient Hospital Stay: Payer: Medicare Other

## 2019-01-20 DIAGNOSIS — C3412 Malignant neoplasm of upper lobe, left bronchus or lung: Secondary | ICD-10-CM | POA: Diagnosis not present

## 2019-01-20 DIAGNOSIS — C3492 Malignant neoplasm of unspecified part of left bronchus or lung: Secondary | ICD-10-CM

## 2019-01-20 DIAGNOSIS — Z510 Encounter for antineoplastic radiation therapy: Secondary | ICD-10-CM | POA: Diagnosis not present

## 2019-01-20 LAB — CBC WITH DIFFERENTIAL/PLATELET
Abs Immature Granulocytes: 0.01 10*3/uL (ref 0.00–0.07)
Basophils Absolute: 0 10*3/uL (ref 0.0–0.1)
Basophils Relative: 0 %
Eosinophils Absolute: 0.7 10*3/uL — ABNORMAL HIGH (ref 0.0–0.5)
Eosinophils Relative: 12 %
HCT: 31.7 % — ABNORMAL LOW (ref 39.0–52.0)
Hemoglobin: 10 g/dL — ABNORMAL LOW (ref 13.0–17.0)
Immature Granulocytes: 0 %
Lymphocytes Relative: 8 %
Lymphs Abs: 0.4 10*3/uL — ABNORMAL LOW (ref 0.7–4.0)
MCH: 26.7 pg (ref 26.0–34.0)
MCHC: 31.5 g/dL (ref 30.0–36.0)
MCV: 84.8 fL (ref 80.0–100.0)
Monocytes Absolute: 0.8 10*3/uL (ref 0.1–1.0)
Monocytes Relative: 13 %
NRBC: 0 % (ref 0.0–0.2)
Neutro Abs: 3.9 10*3/uL (ref 1.7–7.7)
Neutrophils Relative %: 67 %
Platelets: 227 10*3/uL (ref 150–400)
RBC: 3.74 MIL/uL — ABNORMAL LOW (ref 4.22–5.81)
RDW: 16.1 % — ABNORMAL HIGH (ref 11.5–15.5)
WBC: 5.8 10*3/uL (ref 4.0–10.5)

## 2019-01-20 LAB — COMPREHENSIVE METABOLIC PANEL
ALBUMIN: 3.3 g/dL — AB (ref 3.5–5.0)
ALT: 13 U/L (ref 0–44)
AST: 20 U/L (ref 15–41)
Alkaline Phosphatase: 79 U/L (ref 38–126)
Anion gap: 7 (ref 5–15)
BILIRUBIN TOTAL: 0.2 mg/dL — AB (ref 0.3–1.2)
BUN: 36 mg/dL — ABNORMAL HIGH (ref 8–23)
CHLORIDE: 107 mmol/L (ref 98–111)
CO2: 23 mmol/L (ref 22–32)
Calcium: 8.8 mg/dL — ABNORMAL LOW (ref 8.9–10.3)
Creatinine, Ser: 1.55 mg/dL — ABNORMAL HIGH (ref 0.61–1.24)
GFR calc Af Amer: 48 mL/min — ABNORMAL LOW (ref >60)
GFR calc non Af Amer: 41 mL/min — ABNORMAL LOW (ref >60)
Glucose, Bld: 113 mg/dL — ABNORMAL HIGH (ref 70–99)
Potassium: 4.2 mmol/L (ref 3.5–5.1)
Sodium: 137 mmol/L (ref 135–145)
Total Protein: 7.9 g/dL (ref 6.5–8.1)

## 2019-01-21 ENCOUNTER — Ambulatory Visit
Admission: RE | Admit: 2019-01-21 | Discharge: 2019-01-21 | Disposition: A | Discharge status: Home / Self Care | Payer: Medicare Other | Source: Ambulatory Visit | Attending: Radiation Oncology | Admitting: Radiation Oncology

## 2019-01-21 DIAGNOSIS — Z510 Encounter for antineoplastic radiation therapy: Secondary | ICD-10-CM | POA: Diagnosis not present

## 2019-01-22 ENCOUNTER — Ambulatory Visit: Payer: Medicare Other | Admitting: Oncology

## 2019-01-22 ENCOUNTER — Inpatient Hospital Stay: Payer: Medicare Other

## 2019-01-22 ENCOUNTER — Ambulatory Visit
Admission: RE | Admit: 2019-01-22 | Discharge: 2019-01-22 | Disposition: A | Discharge status: Home / Self Care | Payer: Medicare Other | Source: Ambulatory Visit | Attending: Radiation Oncology | Admitting: Radiation Oncology

## 2019-01-22 DIAGNOSIS — Z510 Encounter for antineoplastic radiation therapy: Secondary | ICD-10-CM | POA: Diagnosis not present

## 2019-01-22 NOTE — Progress Notes (Signed)
Nutrition Follow-up:  Patient with lung cancer.  Patient receiving radiation therapy with last treatment today.  Patient followed by Dr. Tasia Catchings.  Met with patient and wife after treatment today.  Wife reports that patient is taking megace a little more on regular basis.  Patient reports appetite might be a "little" bit better. Wife reports patient needs to eat a little bit more breakfast, may just eat cereal of drink ensure.  Patient reports I eat when I want to and what I want.  Reports that he wants to gain weight.   Medications: reviewed  Labs: BUN 36, creatinine 1.55  Anthropometrics:   Weight not checked yesterday as patient refused. Last weight 127 lb 8 oz on 1/28   NUTRITION DIAGNOSIS: Inadequate oral intake continues   INTERVENTION:  Asked patient how he can reach his goal of gaining weight. "Eat more" Asked him to be more specific and he couldn't come up with a goal.  Provided education on ways to increase calories to help him increase weight.  Patient still unable to provide a goal at this time. "I am going to weigh 200 lbs when I see you again." Unable to tell how is going to get there. 3rd case of ensure enlive giving to patient today with coupons.    MONITORING, EVALUATION, GOAL: weight trends, intake   NEXT VISIT: March 12 following radiation MD follow-up visit  Patrick Harding B. Zenia Resides, [place], [place] Registered Dietitian [phone] (pager)

## 2019-01-23 ENCOUNTER — Inpatient Hospital Stay: Payer: Medicare Other | Admitting: Oncology

## 2019-02-09 NOTE — Progress Notes (Signed)
Hematology/Oncology follow up note [facility] Telephone:([phone] [phone]   Patient Care Team: Alwyn Pea, NP as PCP - General (Family Medicine) Telford Nab, RN as Registered Nurse  REFERRING PROVIDER: Dr.Malinda  CHIEF COMPLAINTS/REASON FOR VISIT:  Evaluation of lung mass  HISTORY OF PRESENTING ILLNESS:  Patrick Harding is a  85 y.o.  male with PMH listed below who was referred to me for evaluation of lung mass.   Patient recently presented emergency room for evaluation of fever of 101, nonproductive cough and profound weakness. CT done in the emergency room 10/08/2018 showed central left lung mass in the suprahilar region extending into AP window measuring approximately 3 x 4 x 6 cm, larger compared to previous exams.   Patient has been seen by Dr. Felicie Morn previously for lung mass. Patient's previous image work-up includes PET scan done 06/18/2017 which showed hypermetabolic right hilar mass or indeterminate etiology.  Favor bronchogenic carcinoma or lymphoma.  Mild metabolic activity of mediastinal lymph nodes is indeterminate.  Resolution of right middle lobe nodularity suggest infectious or inflammatory process.  Hypermetabolic asymmetric endplate lytic lesion at S1 with metabolic activity appear.  Favor benign process but cannot exclude solitary metastasis. Patient was seen and evaluated by Dr. Felicie Morn and had 01/28/2018 EBUS guided needle biopsy taking and several lymph node stations.  Patient also had a left upper lobe transbronchial cytology brushing, washing/BAL, removal of mucous plug from both lungs. Biopsy pathology was negative.  Per Dr. Ashby Dawes, patient's case was discussed on tumor board. Consensus reached to repeat a PET scan in 3 to 4 months for reevaluation and possible rebiopsy.  Patient had called office consult his follow-up appointments for PET scan and had not seen Dr. Felicie Morn since then.  #Today patient was  accompanied by wife and daughter to clinic to discuss CT scan and management plan. Reports feeling weak and fatigued.  Appetite has decreased.  His weight appears stable since February this year. Lives at home with wife. Mild shortness of breath with exertion.  Per family pack, patient does not walk much.  History of stroke in 2017, residual right sided weakness.  History of chronic systolic CHF, Neihart class III, global LVEF 25% [12/08/2015 2D echo] On Eliquis 2.5 twice daily for anticoagulation for risk reduction of stroke with atrial fibrillation/flutter.  # S/p BUS biopsy.  Left upper lobe biopsy showed respiratory mucosa with areas of squamous metaplasia and rare atypical cells.  Left hilar mass: EBUS positive for malignancy. 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  Paratracheal mass: suspicious for malignancy.  Lung left upper lobe, BAL: non diagnostic.  Left upper lobe brushing negative for malignancy.    INTERVAL HISTORY Patrick Harding is a 85 y.o. male who has above history reviewed by me today presents for follow up visit for management of lung cancer. Reports not feeling well.  Appetite is very poor. Not eating.  Also having pain.  Review of Systems  Constitutional: Positive for malaise/fatigue. Negative for chills, fever and weight loss.  HENT: Negative for nosebleeds and sore throat.   Eyes: Negative for double vision and redness.  Respiratory: Positive for cough and shortness of breath. Negative for wheezing.   Cardiovascular: Negative for chest pain, palpitations, orthopnea and leg swelling.  Gastrointestinal: Negative for abdominal pain, blood in stool, nausea and vomiting.  Genitourinary: Negative for dysuria and frequency.  Musculoskeletal: Negative for myalgias.       Right shoulder pain.   Skin: Negative for itching  and rash.  Neurological: Negative for dizziness, tingling, tremors and weakness.  Endo/Heme/Allergies: Negative  for environmental allergies. Does not bruise/bleed easily.  Psychiatric/Behavioral: Negative for hallucinations.    MEDICAL HISTORY:  Past Medical History:  Diagnosis Date  . Anemia   . Aortic aneurysm without rupture ([place]) 12/26/2015  . Arrhythmia   . Atrial fibrillation ([place])   . Benign neoplasm of cecum   . Benign neoplasm of transverse colon   . Blood in stool   . Cachexia ([place]) 05/11/2017  . CAP (community acquired pneumonia) 03/20/2016  . Chronic systolic CHF (congestive heart failure), NYHA class 3 ([place]) 12/26/2015   Overview:  Global ef 25%  . COPD (chronic obstructive pulmonary disease) ([place])   . Dysarthria due to recent stroke 03/12/2016  . Dyspnea on exertion 01/03/2016  . Dysrhythmia    atrial fib.  . Erectile dysfunction   . Essential hypertension 04/14/2015  . Gastritis   . Generalized weakness 05/11/2017  . Hyperlipidemia   . Hypertension   . Left elbow pain 05/11/2017  . Lower extremity weakness (Right) 01/24/2016  . 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 Lumbar facet syndrome 08/10/2015  . Lumbar foraminal stenosis (Left Moderate-severe L4-5) (Bilateral Moderate L5-S1) 03/12/2016  . Nonrheumatic aortic valve insufficiency 01/03/2016  . Panlobular emphysema ([place]) 01/03/2016  . Persistent atrial fibrillation 04/14/2015   Overview:  Last Assessment & Plan:  His ventricular rate continues to be elevated. Thus, I elected to increase the dose of metoprolol to 50 mg twice daily. Continue anticoagulation. He is complaining of increased dyspnea but his oxygen saturation is 95% on room air and his lungs are relatively clear but he does have diminished breath sounds. EKG does not show any new changes. Once his heart rate i  . Pressure ulcer 03/21/2016  . Rectal polyp   . Reflux esophagitis   . Renal insufficiency   . Sacroiliac joint dysfunction 08/10/2015  . Stroke determined by clinical assessment ([place]) 03/12/2016  . Thoracic ascending aortic aneurysm ([place]) 01/11/2016   Overview:  4.8cm 12/2015    . Vitamin D deficiency   . Weakness of both legs 12/26/2015    SURGICAL HISTORY: Past Surgical History:  Procedure Laterality Date  . BACK SURGERY     lumbar  . COLONOSCOPY WITH PROPOFOL N/A 07/17/2016   Procedure: COLONOSCOPY WITH PROPOFOL;  Surgeon: [name], MD;  Location: [facility] ENDOSCOPY;  Service: Endoscopy;  Laterality: N/A;  . ENDOBRONCHIAL ULTRASOUND N/A 01/27/2018   Procedure: ENDOBRONCHIAL ULTRASOUND;  Surgeon: [name], MD;  Location: [facility] ORS;  Service: Pulmonary;  Laterality: N/A;  . ENDOBRONCHIAL ULTRASOUND Left 11/04/2018   Procedure: ENDOBRONCHIAL ULTRASOUND;  Surgeon: [name], MD;  Location: [facility] ORS;  Service: Pulmonary;  Laterality: Left;  . ESOPHAGOGASTRODUODENOSCOPY (EGD) WITH PROPOFOL N/A 07/17/2016   Procedure: ESOPHAGOGASTRODUODENOSCOPY (EGD) WITH PROPOFOL;  Surgeon: [name], MD;  Location: [facility] ENDOSCOPY;  Service: Endoscopy;  Laterality: N/A;  . EYE SURGERY     cataract right  . FACIAL FRACTURE SURGERY    . Head surgery Left   . TONSILLECTOMY      SOCIAL HISTORY: Social History   Socioeconomic History  . Marital status: Married    Spouse name: Not on file  . Number of children: Not on file  . Years of education: Not on file  . Highest education level: Not on file  Occupational History  . Not on file  Social Needs  . Financial resource strain: Not on file  . Food insecurity:    Worry: Not on file  Inability: Not on file  . Transportation needs:    Medical: Not on file    Non-medical: Not on file  Tobacco Use  . Smoking status: Former Smoker    Packs/day: 0.50    Years: 20.00    Pack years: 10.00    Types: Cigarettes    Last attempt to quit: 10/24/2015    Years since quitting: 3.2  . Smokeless tobacco: Never Used  Substance and Sexual Activity  . Alcohol use: No    Alcohol/week: 0.0 standard drinks    Comment: quit smoking about 4 years ago  . Drug use: No  . Sexual activity: Not on file  Lifestyle  .  Physical activity:    Days per week: Not on file    Minutes per session: Not on file  . Stress: Not on file  Relationships  . Social connections:    Talks on phone: Not on file    Gets together: Not on file    Attends religious service: Not on file    Active member of club or organization: Not on file    Attends meetings of clubs or organizations: Not on file    Relationship status: Not on file  . Intimate partner violence:    Fear of current or ex partner: Not on file    Emotionally abused: Not on file    Physically abused: Not on file    Forced sexual activity: Not on file  Other Topics Concern  . Not on file  Social History Narrative  . Not on file    FAMILY HISTORY: Family History  Problem Relation Age of Onset  . Heart failure Mother   . Arthritis Father   . Bladder Cancer Neg Hx   . Kidney cancer Neg Hx   . Prolactinoma Neg Hx   . Prostate cancer Neg Hx     ALLERGIES:  is allergic to aspirin and penicillins.  MEDICATIONS:  Current Outpatient Medications  Medication Sig Dispense Refill  . acetaminophen (TYLENOL) 500 MG tablet Take 1,000 mg by mouth every 6 (six) hours as needed for mild pain or headache.    . AMBULATORY NON FORMULARY MEDICATION Medication Name: nebulizer and supplies DX:J43.1 1 each 0  . apixaban (ELIQUIS) 2.5 MG TABS tablet Take 1.25 mg by mouth 2 (two) times daily.     Marland Kitchen ipratropium-albuterol (DUONEB) 0.5-2.5 (3) MG/3ML SOLN Take 3 mLs by nebulization every 8 (eight) hours. And prn (Patient taking differently: Take 3 mLs by nebulization every 8 (eight) hours as needed (for shortness of breath or wheezing). ) 360 mL 11  . megestrol (MEGACE) 40 MG tablet Take 1 tablet (40 mg total) by mouth 2 (two) times daily. 60 tablet 0  . metoprolol (LOPRESSOR) 50 MG tablet Take 1 tablet (50 mg total) by mouth 2 (two) times daily. 60 tablet 2  . oxyCODONE (OXY IR/ROXICODONE) 5 MG immediate release tablet Take 1 tablet (5 mg total) by mouth every 6 (six) hours as  needed for severe pain. 30 tablet 0  . diltiazem (CARDIZEM CD) 180 MG 24 hr capsule Take 180 mg by mouth daily.    . Multiple Vitamin (MULTI-VITAMINS) TABS Take 2 tablets by mouth daily.     . predniSONE (STERAPRED UNI-PAK 21 TAB) 10 MG (21) TBPK tablet On day 1- take 6 tablets (55m),  Day 2- take 5 tablets (50 mg),  Day 3- take 4 tablets (458m,  Day 4- take 3 tablets (3021m  Day 5- take 2 tablets (20 mg),  Day 6- take 1 tablet (10 mg) (Patient not taking: Reported on 01/19/2019) 21 tablet 0   No current facility-administered medications for this visit.      PHYSICAL EXAMINATION: ECOG PERFORMANCE STATUS: 3 - Symptomatic, >50% confined to bed Vitals:   01/19/19 1451  BP: (!) 96/52  Pulse: 83  Resp: 18  Temp: (!) 95.1 F (35.1 C)   Filed Weights    Physical Exam Constitutional:      General: He is not in acute distress.    Comments: Thin elderly male, chronic ill appearance, sitting in wheelchair. Cachectic.   HENT:     Head: Normocephalic and atraumatic.  Eyes:     General: No scleral icterus.    Pupils: Pupils are equal, round, and reactive to light.  Neck:     Musculoskeletal: Normal range of motion and neck supple.  Cardiovascular:     Rate and Rhythm: Normal rate and regular rhythm.     Heart sounds: Normal heart sounds.  Pulmonary:     Effort: Pulmonary effort is normal. No respiratory distress.     Breath sounds: No wheezing or rales.     Comments: Severely decreased breath sound bilaterally. Abdominal:     General: Bowel sounds are normal. There is no distension.     Palpations: Abdomen is soft. There is no mass.     Tenderness: There is no abdominal tenderness.  Musculoskeletal: Normal range of motion.        General: No deformity.  Skin:    General: Skin is warm and dry.     Findings: No erythema or rash.  Neurological:     Mental Status: He is alert and oriented to person, place, and time.     Cranial Nerves: No cranial nerve deficit.     Coordination:  Coordination normal.  Psychiatric:        Behavior: Behavior normal.        Thought Content: Thought content normal.      LABORATORY DATA:  I have reviewed the data as listed Lab Results  Component Value Date   WBC 5.8 01/20/2019   HGB 10.0 (L) 01/20/2019   HCT 31.7 (L) 01/20/2019   MCV 84.8 01/20/2019   PLT 227 01/20/2019   Recent Labs    10/08/18 1553  12/15/18 1210 01/06/19 1235 01/20/19 1331  NA 136   < > 140 141 137  K 4.6   < > 4.4 4.4 4.2  CL 103   < > 109 110 107  CO2 26   < > _0 GLUCOSE 102*   < > 86 96 113*  BUN 25*   < > 27* 33* 36*  CREATININE 1.74*   < > 1.57* 1.39* 1.55*  CALCIUM 8.5*   < > 9.0 8.9 8.8*  GFRNONAA 35*   < > 40* 47* 41*  GFRAA 40*   < > 47* 54* 48*  PROT 7.6   < > 8.2* 7.9 7.9  ALBUMIN 3.1*   < > 3.3* 3.2* 3.3*  AST 23   < > _1 ALT 18   < > _2 ALKPHOS 96   < > 78 66 79  BILITOT 0.9   < > 0.4 0.4 0.2*  BILIDIR 0.3*  --   --   --   --   IBILI 0.6  --   --   --   --    < > = values in this interval not  displayed.   Iron/TIBC/Ferritin/ %Sat No results found for: IRON, TIBC, FERRITIN, IRONPCTSAT    RADIOGRAPHIC STUDIES: I have personally reviewed the radiological images as listed and agreed with the findings in the report. # 10/08/2018 CT chest w contrast Central left lung mass in the suprahilar region extending into the AP window measuring approximately 3.4 x 6 cm likely larger compared to the previous exams. Found to be hypermetabolic on previous PET-CT and likely represents bronchogenic carcinoma. Adjacent mediastinal adenopathy as described. Several small pulmonary nodules bilaterally which may be due to metastatic disease although could be seen with atypical infectious or inflammatory processes. Recommend attention on follow-up.  Aneurysmal dilatation of the ascending thoracic aorta measuring 4.8 cm in AP diameter without significant change. Aneurysmal dilatation of the descending thoracic aorta measuring 4.8 cm  in AP diameter. Ascending thoracic aortic aneurysm. Recommend semi-annual imaging followup by CTA or MRA and referral to cardiothoracic surgery if not already obtained. This recommendation follows 2010 ACCF/AHA/AATS/ACR/ASA/SCA/SCAI/SIR/STS/SVM Guidelines for the Diagnosis and Management of Patients With Thoracic Aortic Disease. Circulation. 2010; 121: T245-Y099.   ASSESSMENT & PLAN:  1. Non-small cell cancer of left lung ([place])   2. Cachexia (HCC)   3. Other fatigue   4. Weight loss, abnormal   5. Dehydration    #Non-small cell lung cancer, T4N3, possible M1 disease if bilateral axillary and porta cava node hypermetabolic activity are counted.At least stage IIIc, or stage IV disease Continues to be weak.  Offer the patient for IV hydration.  He declines.  Encourage patient to improve oral hydration.  Labs are reviewed and discussed with patient. Very poor appetite and weight loss. He follows up with nutrition. Not complaint with Megace.  Recommend patient to establish care with palliative care.  Repeat CBC CMP in 2 weeks and return to the clinic for further evaluation.  Return of visit:  Earlie Server, MD, PhD Hematology Oncology [facility] Pager- [phone] 02/09/2019

## 2019-02-12 ENCOUNTER — Other Ambulatory Visit: Payer: Self-pay

## 2019-02-12 DIAGNOSIS — C3492 Malignant neoplasm of unspecified part of left bronchus or lung: Secondary | ICD-10-CM

## 2019-02-13 ENCOUNTER — Inpatient Hospital Stay (HOSPITAL_BASED_OUTPATIENT_CLINIC_OR_DEPARTMENT_OTHER): Payer: Medicare HMO | Admitting: Oncology

## 2019-02-13 ENCOUNTER — Other Ambulatory Visit: Payer: Self-pay

## 2019-02-13 ENCOUNTER — Encounter: Payer: Self-pay | Admitting: Oncology

## 2019-02-13 ENCOUNTER — Inpatient Hospital Stay: Payer: Medicare HMO | Attending: Oncology

## 2019-02-13 VITALS — BP 129/53 | HR 68 | Temp 97.4°F | Wt 131.0 lb

## 2019-02-13 DIAGNOSIS — C3412 Malignant neoplasm of upper lobe, left bronchus or lung: Secondary | ICD-10-CM | POA: Diagnosis present

## 2019-02-13 DIAGNOSIS — E876 Hypokalemia: Secondary | ICD-10-CM

## 2019-02-13 DIAGNOSIS — Z87891 Personal history of nicotine dependence: Secondary | ICD-10-CM | POA: Diagnosis not present

## 2019-02-13 DIAGNOSIS — Z79899 Other long term (current) drug therapy: Secondary | ICD-10-CM | POA: Diagnosis not present

## 2019-02-13 DIAGNOSIS — R64 Cachexia: Secondary | ICD-10-CM

## 2019-02-13 DIAGNOSIS — Z923 Personal history of irradiation: Secondary | ICD-10-CM | POA: Diagnosis not present

## 2019-02-13 DIAGNOSIS — C3492 Malignant neoplasm of unspecified part of left bronchus or lung: Secondary | ICD-10-CM

## 2019-02-13 DIAGNOSIS — I110 Hypertensive heart disease with heart failure: Secondary | ICD-10-CM | POA: Insufficient documentation

## 2019-02-13 DIAGNOSIS — I5022 Chronic systolic (congestive) heart failure: Secondary | ICD-10-CM | POA: Diagnosis not present

## 2019-02-13 DIAGNOSIS — J449 Chronic obstructive pulmonary disease, unspecified: Secondary | ICD-10-CM | POA: Diagnosis not present

## 2019-02-13 DIAGNOSIS — R5383 Other fatigue: Secondary | ICD-10-CM

## 2019-02-13 DIAGNOSIS — Z7901 Long term (current) use of anticoagulants: Secondary | ICD-10-CM | POA: Insufficient documentation

## 2019-02-13 DIAGNOSIS — R530 Neoplastic (malignant) related fatigue: Secondary | ICD-10-CM

## 2019-02-13 LAB — CBC WITH DIFFERENTIAL/PLATELET
Abs Immature Granulocytes: 0.06 10*3/uL (ref 0.00–0.07)
Basophils Absolute: 0 10*3/uL (ref 0.0–0.1)
Basophils Relative: 0 %
Eosinophils Absolute: 0.3 10*3/uL (ref 0.0–0.5)
Eosinophils Relative: 4 %
HCT: 30.3 % — ABNORMAL LOW (ref 39.0–52.0)
Hemoglobin: 9.8 g/dL — ABNORMAL LOW (ref 13.0–17.0)
Immature Granulocytes: 1 %
Lymphocytes Relative: 13 %
Lymphs Abs: 1.2 10*3/uL (ref 0.7–4.0)
MCH: 27.8 pg (ref 26.0–34.0)
MCHC: 32.3 g/dL (ref 30.0–36.0)
MCV: 86.1 fL (ref 80.0–100.0)
Monocytes Absolute: 1.4 10*3/uL — ABNORMAL HIGH (ref 0.1–1.0)
Monocytes Relative: 16 %
Neutro Abs: 6.2 10*3/uL (ref 1.7–7.7)
Neutrophils Relative %: 66 %
Platelets: 221 10*3/uL (ref 150–400)
RBC: 3.52 MIL/uL — ABNORMAL LOW (ref 4.22–5.81)
RDW: 17.2 % — ABNORMAL HIGH (ref 11.5–15.5)
WBC: 9.2 10*3/uL (ref 4.0–10.5)
nRBC: 0 % (ref 0.0–0.2)

## 2019-02-13 LAB — COMPREHENSIVE METABOLIC PANEL
ALT: 19 U/L (ref 0–44)
AST: 19 U/L (ref 15–41)
Albumin: 3.2 g/dL — ABNORMAL LOW (ref 3.5–5.0)
Alkaline Phosphatase: 63 U/L (ref 38–126)
Anion gap: 6 (ref 5–15)
BUN: 37 mg/dL — AB (ref 8–23)
CO2: 21 mmol/L — ABNORMAL LOW (ref 22–32)
Calcium: 8.4 mg/dL — ABNORMAL LOW (ref 8.9–10.3)
Chloride: 106 mmol/L (ref 98–111)
Creatinine, Ser: 1.31 mg/dL — ABNORMAL HIGH (ref 0.61–1.24)
GFR calc Af Amer: 58 mL/min — ABNORMAL LOW (ref >60)
GFR calc non Af Amer: 50 mL/min — ABNORMAL LOW (ref >60)
Glucose, Bld: 94 mg/dL (ref 70–99)
Potassium: 4.5 mmol/L (ref 3.5–5.1)
Sodium: 133 mmol/L — ABNORMAL LOW (ref 135–145)
Total Bilirubin: 0.5 mg/dL (ref 0.3–1.2)
Total Protein: 7.8 g/dL (ref 6.5–8.1)

## 2019-02-13 MED ORDER — DM-GUAIFENESIN ER 30-600 MG PO TB12: 1.0000 | ORAL_TABLET | Freq: Two times a day (BID) | ORAL | 0 refills | Status: DC

## 2019-02-13 MED ORDER — GUAIFENESIN 100 MG/5ML PO SYRP: 200.0000 mg | ORAL_SOLUTION | Freq: Three times a day (TID) | ORAL | 0 refills | Status: DC | PRN

## 2019-02-13 MED ORDER — MEGESTROL ACETATE 40 MG PO TABS: 40.0000 mg | ORAL_TABLET | Freq: Two times a day (BID) | ORAL | 3 refills | Status: AC

## 2019-02-13 NOTE — Progress Notes (Signed)
Patient here for follow up. Pt continues to have cough. Stays tired and weak. Pt taking megace and appetite is getting better.

## 2019-02-13 NOTE — Progress Notes (Signed)
Hematology/Oncology follow up note [facility] Telephone:([phone]) [phone] Fax:([phone]   Patient Care Team: Alwyn Pea, NP as PCP - General (Family Medicine) Telford Nab, RN as Registered Nurse  REFERRING PROVIDER: Dr.Malinda  CHIEF COMPLAINTS/REASON FOR VISIT:  Evaluation of lung mass  HISTORY OF PRESENTING ILLNESS:  Patrick Harding is a  85 y.o.  male with PMH listed below who was referred to me for evaluation of lung mass.   Patient recently presented emergency room for evaluation of fever of 101, nonproductive cough and profound weakness. CT done in the emergency room 10/08/2018 showed central left lung mass in the suprahilar region extending into AP window measuring approximately 3 x 4 x 6 cm, larger compared to previous exams.   Patient has been seen by Dr. Felicie Morn previously for lung mass. Patient's previous image work-up includes PET scan done 06/18/2017 which showed hypermetabolic right hilar mass or indeterminate etiology.  Favor bronchogenic carcinoma or lymphoma.  Mild metabolic activity of mediastinal lymph nodes is indeterminate.  Resolution of right middle lobe nodularity suggest infectious or inflammatory process.  Hypermetabolic asymmetric endplate lytic lesion at S1 with metabolic activity appear.  Favor benign process but cannot exclude solitary metastasis. Patient was seen and evaluated by Dr. Felicie Morn and had 01/28/2018 EBUS guided needle biopsy taking and several lymph node stations.  Patient also had a left upper lobe transbronchial cytology brushing, washing/BAL, removal of mucous plug from both lungs. Biopsy pathology was negative.  Per Dr. Ashby Dawes, patient's case was discussed on tumor board. Consensus reached to repeat a PET scan in 3 to 4 months for reevaluation and possible rebiopsy.  Patient had called office consult his follow-up appointments for PET scan and had not seen Dr. Felicie Morn since then.  #Today patient was  accompanied by wife and daughter to clinic to discuss CT scan and management plan. Reports feeling weak and fatigued.  Appetite has decreased.  His weight appears stable since February this year. Lives at home with wife. Mild shortness of breath with exertion.  Per family pack, patient does not walk much.  History of stroke in 2017, residual right sided weakness.  History of chronic systolic CHF, Neihart class III, global LVEF 25% [12/08/2015 2D echo] On Eliquis 2.5 twice daily for anticoagulation for risk reduction of stroke with atrial fibrillation/flutter.  # S/p EBUS biopsy.  Left upper lobe biopsy showed respiratory mucosa with areas of squamous metaplasia and rare atypical cells.  Left hilar mass: EBUS positive for malignancy. 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  Paratracheal mass: suspicious for malignancy.  Lung left upper lobe, BAL: non diagnostic.  Left upper lobe brushing negative for malignancy.    INTERVAL HISTORY Patrick Harding is a 85 y.o. male who has above history reviewed by me today presents for follow up visit for management of lung cancer. He finished definitive radiation on 01/22/2019.  Reports feeling slightly better, still not at baseline. Sits in his recliner all day long.  Appetite is slightly better.  Patient takes Megace 40 mg twice daily.  Patient has gained 4 pounds since end of January this year. Patient was accompanied by wife.  Patient also has cough, nonproductive.  No fever or chills.  He recently finished a course of azithromycin treatment.  Denies any shortness of breath more than his baseline.  No exacerbating or alleviating factors.   Review of Systems  Constitutional: Positive for malaise/fatigue. Negative for chills, fever and weight loss.  HENT: Negative for nosebleeds and  sore throat.   Eyes: Negative for double vision and redness.  Respiratory: Positive for cough and shortness of breath. Negative for  wheezing.   Cardiovascular: Negative for chest pain, palpitations, orthopnea and leg swelling.  Gastrointestinal: Negative for abdominal pain, blood in stool, nausea and vomiting.  Genitourinary: Negative for dysuria and frequency.  Musculoskeletal: Negative for myalgias.       Right shoulder pain.   Skin: Negative for itching and rash.  Neurological: Negative for dizziness, tingling, tremors and weakness.  Endo/Heme/Allergies: Negative for environmental allergies. Does not bruise/bleed easily.  Psychiatric/Behavioral: Negative for hallucinations.    MEDICAL HISTORY:  Past Medical History:  Diagnosis Date  . Anemia   . Aortic aneurysm without rupture ([place]) 12/26/2015  . Arrhythmia   . Atrial fibrillation ([place])   . Benign neoplasm of cecum   . Benign neoplasm of transverse colon   . Blood in stool   . Cachexia ([place]) 05/11/2017  . CAP (community acquired pneumonia) 03/20/2016  . Chronic systolic CHF (congestive heart failure), NYHA class 3 ([place]) 12/26/2015   Overview:  Global ef 25%  . COPD (chronic obstructive pulmonary disease) ([place])   . Dysarthria due to recent stroke 03/12/2016  . Dyspnea on exertion 01/03/2016  . Dysrhythmia    atrial fib.  . Erectile dysfunction   . Essential hypertension 04/14/2015  . Gastritis   . Generalized weakness 05/11/2017  . Hyperlipidemia   . Hypertension   . Left elbow pain 05/11/2017  . Lower extremity weakness (Right) 01/24/2016  . 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 Lumbar facet syndrome 08/10/2015  . Lumbar foraminal stenosis (Left Moderate-severe L4-5) (Bilateral Moderate L5-S1) 03/12/2016  . Nonrheumatic aortic valve insufficiency 01/03/2016  . Panlobular emphysema ([place]) 01/03/2016  . Persistent atrial fibrillation 04/14/2015   Overview:  Last Assessment & Plan:  His ventricular rate continues to be elevated. Thus, I elected to increase the dose of metoprolol to 50 mg twice daily. Continue anticoagulation. He is complaining of increased dyspnea but his  oxygen saturation is 95% on room air and his lungs are relatively clear but he does have diminished breath sounds. EKG does not show any new changes. Once his heart rate i  . Pressure ulcer 03/21/2016  . Rectal polyp   . Reflux esophagitis   . Renal insufficiency   . Sacroiliac joint dysfunction 08/10/2015  . Stroke determined by clinical assessment ([place]) 03/12/2016  . Thoracic ascending aortic aneurysm ([place]) 01/11/2016   Overview:  4.8cm 12/2015  . Vitamin D deficiency   . Weakness of both legs 12/26/2015    SURGICAL HISTORY: Past Surgical History:  Procedure Laterality Date  . BACK SURGERY     lumbar  . COLONOSCOPY WITH PROPOFOL N/A 07/17/2016   Procedure: COLONOSCOPY WITH PROPOFOL;  Surgeon: [name], MD;  Location: [facility] ENDOSCOPY;  Service: Endoscopy;  Laterality: N/A;  . ENDOBRONCHIAL ULTRASOUND N/A 01/27/2018   Procedure: ENDOBRONCHIAL ULTRASOUND;  Surgeon: [name], MD;  Location: [facility] ORS;  Service: Pulmonary;  Laterality: N/A;  . ENDOBRONCHIAL ULTRASOUND Left 11/04/2018   Procedure: ENDOBRONCHIAL ULTRASOUND;  Surgeon: [name], MD;  Location: [facility] ORS;  Service: Pulmonary;  Laterality: Left;  . ESOPHAGOGASTRODUODENOSCOPY (EGD) WITH PROPOFOL N/A 07/17/2016   Procedure: ESOPHAGOGASTRODUODENOSCOPY (EGD) WITH PROPOFOL;  Surgeon: [name], MD;  Location: [facility] ENDOSCOPY;  Service: Endoscopy;  Laterality: N/A;  . EYE SURGERY     cataract right  . FACIAL FRACTURE SURGERY    . Head surgery Left   . TONSILLECTOMY      SOCIAL HISTORY:  Social History   Socioeconomic History  . Marital status: Married    Spouse name: Not on file  . Number of children: Not on file  . Years of education: Not on file  . Highest education level: Not on file  Occupational History  . Not on file  Social Needs  . Financial resource strain: Not on file  . Food insecurity:    Worry: Not on file    Inability: Not on file  . Transportation needs:    Medical: Not on file     Non-medical: Not on file  Tobacco Use  . Smoking status: Former Smoker    Packs/day: 0.50    Years: 20.00    Pack years: 10.00    Types: Cigarettes    Last attempt to quit: 10/24/2015    Years since quitting: 3.3  . Smokeless tobacco: Never Used  Substance and Sexual Activity  . Alcohol use: No    Alcohol/week: 0.0 standard drinks    Comment: quit smoking about 4 years ago  . Drug use: No  . Sexual activity: Not on file  Lifestyle  . Physical activity:    Days per week: Not on file    Minutes per session: Not on file  . Stress: Not on file  Relationships  . Social connections:    Talks on phone: Not on file    Gets together: Not on file    Attends religious service: Not on file    Active member of club or organization: Not on file    Attends meetings of clubs or organizations: Not on file    Relationship status: Not on file  . Intimate partner violence:    Fear of current or ex partner: Not on file    Emotionally abused: Not on file    Physically abused: Not on file    Forced sexual activity: Not on file  Other Topics Concern  . Not on file  Social History Narrative  . Not on file    FAMILY HISTORY: Family History  Problem Relation Age of Onset  . Heart failure Mother   . Arthritis Father   . Bladder Cancer Neg Hx   . Kidney cancer Neg Hx   . Prolactinoma Neg Hx   . Prostate cancer Neg Hx     ALLERGIES:  is allergic to aspirin and penicillins.  MEDICATIONS:  Current Outpatient Medications  Medication Sig Dispense Refill  . acetaminophen (TYLENOL) 500 MG tablet Take 1,000 mg by mouth every 6 (six) hours as needed for mild pain or headache.    . AMBULATORY NON FORMULARY MEDICATION Medication Name: nebulizer and supplies DX:J43.1 1 each 0  . apixaban (ELIQUIS) 2.5 MG TABS tablet Take 1.25 mg by mouth daily.     Marland Kitchen diltiazem (CARDIZEM CD) 180 MG 24 hr capsule Take 180 mg by mouth daily.    Marland Kitchen ipratropium-albuterol (DUONEB) 0.5-2.5 (3) MG/3ML SOLN Take 3 mLs by  nebulization every 8 (eight) hours. And prn (Patient taking differently: Take 3 mLs by nebulization every 8 (eight) hours as needed (for shortness of breath or wheezing). ) 360 mL 11  . megestrol (MEGACE) 40 MG tablet Take 1 tablet (40 mg total) by mouth 2 (two) times daily. 60 tablet 0  . metoprolol (LOPRESSOR) 50 MG tablet Take 1 tablet (50 mg total) by mouth 2 (two) times daily. 60 tablet 2  . Multiple Vitamin (MULTI-VITAMINS) TABS Take 2 tablets by mouth daily.     Marland Kitchen oxyCODONE (OXY IR/ROXICODONE) 5  MG immediate release tablet Take 1 tablet (5 mg total) by mouth every 6 (six) hours as needed for severe pain. 30 tablet 0   No current facility-administered medications for this visit.      PHYSICAL EXAMINATION: ECOG PERFORMANCE STATUS: 2 - Symptomatic, <50% confined to bed Vitals:   02/13/19 1006  BP: (!) 129/53  Pulse: 68  Temp: (!) 97.4 F (36.3 C)  SpO2: 98%   Filed Weights   02/13/19 1006  Weight: 131 lb (59.4 kg)    Physical Exam Constitutional:      General: He is not in acute distress.    Appearance: He is ill-appearing.     Comments: Thin elderly male, chronic ill appearance, sitting in wheelchair. Cachectic.   HENT:     Head: Normocephalic and atraumatic.  Eyes:     General: No scleral icterus.    Pupils: Pupils are equal, round, and reactive to light.  Neck:     Musculoskeletal: Normal range of motion and neck supple.  Cardiovascular:     Rate and Rhythm: Normal rate and regular rhythm.     Heart sounds: Normal heart sounds.  Pulmonary:     Effort: Pulmonary effort is normal. No respiratory distress.     Breath sounds: No wheezing or rales.     Comments: Severely decreased breath sound bilaterally. Abdominal:     General: Bowel sounds are normal. There is no distension.     Palpations: Abdomen is soft. There is no mass.     Tenderness: There is no abdominal tenderness.  Musculoskeletal: Normal range of motion.        General: No deformity.  Skin:     General: Skin is warm and dry.     Findings: No erythema or rash.  Neurological:     Mental Status: He is alert and oriented to person, place, and time.     Cranial Nerves: No cranial nerve deficit.     Coordination: Coordination normal.  Psychiatric:        Behavior: Behavior normal.        Thought Content: Thought content normal.     Comments: Flat affect      LABORATORY DATA:  I have reviewed the data as listed Lab Results  Component Value Date   WBC 9.2 02/13/2019   HGB 9.8 (L) 02/13/2019   HCT 30.3 (L) 02/13/2019   MCV 86.1 02/13/2019   PLT 221 02/13/2019   Recent Labs    10/08/18 1553  01/06/19 1235 01/20/19 1331 02/13/19 0932  NA 136   < > 141 137 133*  K 4.6   < > 4.4 4.2 4.5  CL 103   < > 110 107 106  CO2 26   < > 24 23 21*  GLUCOSE 102*   < > 96 113* 94  BUN 25*   < > 33* 36* 37*  CREATININE 1.74*   < > 1.39* 1.55* 1.31*  CALCIUM 8.5*   < > 8.9 8.8* 8.4*  GFRNONAA 35*   < > 47* 41* 50*  GFRAA 40*   < > 54* 48* 58*  PROT 7.6   < > 7.9 7.9 7.8  ALBUMIN 3.1*   < > 3.2* 3.3* 3.2*  AST 23   < > _0 ALT 18   < > _1 ALKPHOS 96   < > 66 79 63  BILITOT 0.9   < > 0.4 0.2* 0.5  BILIDIR 0.3*  --   --   --   --  IBILI 0.6  --   --   --   --    < > = values in this interval not displayed.   Iron/TIBC/Ferritin/ %Sat No results found for: IRON, TIBC, FERRITIN, IRONPCTSAT    RADIOGRAPHIC STUDIES: I have personally reviewed the radiological images as listed and agreed with the findings in the report. # 10/08/2018 CT chest w contrast Central left lung mass in the suprahilar region extending into the AP window measuring approximately 3.4 x 6 cm likely larger compared to the previous exams. Found to be hypermetabolic on previous PET-CT and likely represents bronchogenic carcinoma. Adjacent mediastinal adenopathy as described. Several small pulmonary nodules bilaterally which may be due to metastatic disease although could be seen with atypical infectious or  inflammatory processes. Recommend attention on follow-up.  Aneurysmal dilatation of the ascending thoracic aorta measuring 4.8 cm in AP diameter without significant change. Aneurysmal dilatation of the descending thoracic aorta measuring 4.8 cm in AP diameter. Ascending thoracic aortic aneurysm. Recommend semi-annual imaging followup by CTA or MRA and referral to cardiothoracic surgery if not already obtained. This recommendation follows 2010 ACCF/AHA/AATS/ACR/ASA/SCA/SCAI/SIR/STS/SVM Guidelines for the Diagnosis and Management of Patients With Thoracic Aortic Disease. Circulation. 2010; 121: K800-L491.   ASSESSMENT & PLAN:  1. Non-small cell cancer of left lung ([place])   2. Cachexia (HCC)   3. Other fatigue   4. Chronic systolic congestive heart failure, NYHA class 3 ([place])   5. Neoplastic malignant related fatigue    #Non-small cell lung cancer, T4N3, Stage IIIC,  possible M1 disease if bilateral axillary and porta cava node hypermetabolic activity are counted.At least stage IIIc, or stage IV disease Patient finishes definitive radiation treatments. Still feeling weak although slightly better than last visit.  Gaining weight during the interval. Labs reviewed and discussed with patient Slight hyponatremia likely secondary to poor oral intake.  Offered patient for IV hydration.  He declines.  Encourage patient to improve oral hydration.  Weight loss have improved.  Appetite has improved after taking Megace 40 mg twice daily.  Continue.  Discussed with patient and wife about start immunotherapy in 2 weeks.  Prior to that I will obtain a baseline CT chest. I discussed the mechanism of action and rationale of using immunotherapy. discussed the potential side effects of immunotherapy including but not limited to diarrhea; skin rash; respiratory failure, neurotoxicity, elevated LFTs/endocrine abnormalities etc. patient and wife prefers to consider about it and follow-up in 2 weeks with me after CT  scan for further discussion.  Orders Placed This Encounter  Procedures  . CT Chest Wo Contrast    Standing Status:   Future    Standing Expiration Date:   02/13/2020    Order Specific Question:   ** REASON FOR EXAM (FREE TEXT)    Answer:   Lung cancer; post radiation follow up    Order Specific Question:   Preferred imaging location?    Answer:   [facility]    Order Specific Question:   Radiology Contrast Protocol - do NOT remove file path    Answer:   [email or web address]   We spent sufficient time to discuss many aspect of care, questions were answered to patient's satisfaction. Total face to face encounter time for this patient visit was 25 min. >50% of the time was  spent in counseling and coordination of care.   Return of visit:  Earlie Server, MD, PhD Hematology Oncology [facility] at [facility] Pager- [phone] 02/13/2019

## 2019-02-19 ENCOUNTER — Other Ambulatory Visit: Payer: Self-pay

## 2019-02-19 ENCOUNTER — Inpatient Hospital Stay: Payer: Medicare HMO

## 2019-02-19 ENCOUNTER — Ambulatory Visit
Admission: RE | Admit: 2019-02-19 | Discharge: 2019-02-19 | Disposition: A | Discharge status: Home / Self Care | Payer: Medicare HMO | Source: Ambulatory Visit | Attending: Radiation Oncology | Admitting: Radiation Oncology

## 2019-02-19 ENCOUNTER — Encounter: Payer: Self-pay | Admitting: Radiation Oncology

## 2019-02-19 VITALS — BP 92/22 | HR 96 | Temp 98.0°F | Wt 127.1 lb

## 2019-02-19 DIAGNOSIS — Z923 Personal history of irradiation: Secondary | ICD-10-CM | POA: Diagnosis not present

## 2019-02-19 DIAGNOSIS — R630 Anorexia: Secondary | ICD-10-CM | POA: Insufficient documentation

## 2019-02-19 DIAGNOSIS — C3492 Malignant neoplasm of unspecified part of left bronchus or lung: Secondary | ICD-10-CM | POA: Diagnosis not present

## 2019-02-19 DIAGNOSIS — Z87891 Personal history of nicotine dependence: Secondary | ICD-10-CM | POA: Insufficient documentation

## 2019-02-19 DIAGNOSIS — Z9221 Personal history of antineoplastic chemotherapy: Secondary | ICD-10-CM | POA: Insufficient documentation

## 2019-02-19 NOTE — Progress Notes (Signed)
Nutrition Follow-up:  Patient with lung cancer and followed by Dr. Tasia Catchings.  Planning to start immunotherapy soon.    Met with patient following follow-up visit with Dr. Baruch Gouty.  Wife reports that appetite had picked up a little bit while on steriods but is currently not taking them.  Patient says appetite is slightly better. Wife reports she offers him grilled cheese sandwiches, eggs, ice cream and reports he will eat it sometimes and other times not.   Patient denies nausea but reports issues with constipation.  Wife reports she gave him a dose of miralax yesterday.  Medications: reviewed  Labs: reviewed  Anthropometrics:   Weight noted 131 lb on 3/6 and 127 lb 1.5 oz today.   127 lb on 1/28   NUTRITION DIAGNOSIS:  Inadequate oral intake continues   INTERVENTION:  Discussed importance of staying on top of constipation to allow him to be able to eat. Patient verbalized understanding. Provided another case of ensure enlive today.  Patient continues to drink 3 per day. Once reports that he wants to gain weight.  Unable to identify goal that he can work on to help reach his goal of gaining weight.  Encouraged him to address constipation issues.  Reviewed foods with high calories and protein     MONITORING, EVALUATION, GOAL: weight trends, intake   NEXT VISIT: as needed  Patrick Harding B. Zenia Resides, [place], [place] Registered Dietitian [phone] (pager)

## 2019-02-19 NOTE — Progress Notes (Signed)
Radiation Oncology Follow up Note  Name: Patrick Harding   Date:   02/19/2019 MRN:  [id number] DOB: August 15, 1934    This 85 y.o. 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  REFERRING PROVIDER: Alwyn Pea, NP  HPI: patient is an 85 year old male now out 1 month having completed concurrent chemoradiation therapy for stage IIIB squamous cell carcinoma the right lung. He is seen today in routine follow-up and is doing fair. He is recently upon Megace for appetite improvement. He has a moderate cough no hemoptysis. He is having no dysphagia at this time..  COMPLICATIONS OF TREATMENT: none  FOLLOW UP COMPLIANCE: keeps appointments   PHYSICAL EXAM:  BP (!) 92/22   Pulse 96   Temp 98 F (36.7 C)   Wt 127 lb 1.5 oz (57.6 kg)   BMI 17.24 kg/m  Elderly male wheelchair-bound in NAD.Well-developed well-nourished patient in NAD. HEENT reveals PERLA, EOMI, discs not visualized.  Oral cavity is clear. No oral mucosal lesions are identified. Neck is clear without evidence of cervical or supraclavicular adenopathy. Lungs are clear to A&P. Cardiac examination is essentially unremarkable with regular rate and rhythm without murmur rub or thrill. Abdomen is benign with no organomegaly or masses noted. Motor sensory and DTR levels are equal and symmetric in the upper and lower extremities. Cranial nerves II through XII are grossly intact. Proprioception is intact. No peripheral adenopathy or edema is identified. No motor or sensory levels are noted. Crude visual fields are within normal range.  RADIOLOGY RESULTS: no current films for review CT scan has been ordered for next month.  PLAN: present time patient is doing fairly well. Continues on Megace for appetite enhancement. I will review his CT scan when it becomes available next month. I've asked to see him back in 3 months for follow-up. Patient  and family know to call with any concerns. I'm encouraged him to continue to increase his nutritional intake.  I would like to take this opportunity to thank you for allowing me to participate in the care of your patient.Noreene Filbert, MD

## 2019-02-27 ENCOUNTER — Ambulatory Visit
Admission: RE | Admit: 2019-02-27 | Discharge: 2019-02-27 | Disposition: A | Discharge status: Home / Self Care | Payer: Medicare HMO | Source: Ambulatory Visit | Attending: Oncology | Admitting: Oncology

## 2019-02-27 ENCOUNTER — Other Ambulatory Visit: Payer: Self-pay

## 2019-02-27 DIAGNOSIS — C3492 Malignant neoplasm of unspecified part of left bronchus or lung: Secondary | ICD-10-CM

## 2019-03-01 ENCOUNTER — Other Ambulatory Visit: Payer: Self-pay

## 2019-03-01 ENCOUNTER — Telehealth: Payer: Self-pay

## 2019-03-01 NOTE — Telephone Encounter (Signed)
Patient wanted me to let [facility] know that he is needing more Ensures. I told him that I would let you know. Thank you. Patient comes in on 03/02/2019 for an appointment with Dr. Tasia Catchings.

## 2019-03-02 ENCOUNTER — Other Ambulatory Visit: Payer: Self-pay

## 2019-03-02 ENCOUNTER — Encounter: Payer: Self-pay | Admitting: Oncology

## 2019-03-02 ENCOUNTER — Inpatient Hospital Stay (HOSPITAL_BASED_OUTPATIENT_CLINIC_OR_DEPARTMENT_OTHER): Payer: Medicare HMO | Admitting: Oncology

## 2019-03-02 ENCOUNTER — Inpatient Hospital Stay: Payer: Medicare HMO

## 2019-03-02 VITALS — BP 103/60 | HR 73 | Temp 96.8°F | Resp 18 | Wt 129.0 lb

## 2019-03-02 DIAGNOSIS — Z5112 Encounter for antineoplastic immunotherapy: Secondary | ICD-10-CM

## 2019-03-02 DIAGNOSIS — J449 Chronic obstructive pulmonary disease, unspecified: Secondary | ICD-10-CM

## 2019-03-02 DIAGNOSIS — R64 Cachexia: Secondary | ICD-10-CM | POA: Diagnosis not present

## 2019-03-02 DIAGNOSIS — C3492 Malignant neoplasm of unspecified part of left bronchus or lung: Secondary | ICD-10-CM

## 2019-03-02 DIAGNOSIS — R634 Abnormal weight loss: Secondary | ICD-10-CM | POA: Diagnosis not present

## 2019-03-02 DIAGNOSIS — Z923 Personal history of irradiation: Secondary | ICD-10-CM

## 2019-03-02 DIAGNOSIS — Z79899 Other long term (current) drug therapy: Secondary | ICD-10-CM

## 2019-03-02 DIAGNOSIS — Z87891 Personal history of nicotine dependence: Secondary | ICD-10-CM

## 2019-03-02 DIAGNOSIS — C3412 Malignant neoplasm of upper lobe, left bronchus or lung: Secondary | ICD-10-CM

## 2019-03-02 HISTORY — DX: Malignant neoplasm of unspecified part of left bronchus or lung: C34.92

## 2019-03-02 LAB — CBC WITH DIFFERENTIAL/PLATELET
Abs Immature Granulocytes: 0.05 10*3/uL (ref 0.00–0.07)
BASOS PCT: 0 %
Basophils Absolute: 0 10*3/uL (ref 0.0–0.1)
EOS ABS: 0.4 10*3/uL (ref 0.0–0.5)
Eosinophils Relative: 6 %
HCT: 28.5 % — ABNORMAL LOW (ref 39.0–52.0)
Hemoglobin: 9.3 g/dL — ABNORMAL LOW (ref 13.0–17.0)
Immature Granulocytes: 1 %
Lymphocytes Relative: 17 %
Lymphs Abs: 1.3 10*3/uL (ref 0.7–4.0)
MCH: 28.1 pg (ref 26.0–34.0)
MCHC: 32.6 g/dL (ref 30.0–36.0)
MCV: 86.1 fL (ref 80.0–100.0)
Monocytes Absolute: 1.1 10*3/uL — ABNORMAL HIGH (ref 0.1–1.0)
Monocytes Relative: 15 %
Neutro Abs: 4.6 10*3/uL (ref 1.7–7.7)
Neutrophils Relative %: 61 %
Platelets: 281 10*3/uL (ref 150–400)
RBC: 3.31 MIL/uL — ABNORMAL LOW (ref 4.22–5.81)
RDW: 16.7 % — ABNORMAL HIGH (ref 11.5–15.5)
WBC: 7.4 10*3/uL (ref 4.0–10.5)
nRBC: 0 % (ref 0.0–0.2)

## 2019-03-02 LAB — COMPREHENSIVE METABOLIC PANEL
ALT: 16 U/L (ref 0–44)
AST: 18 U/L (ref 15–41)
Albumin: 3.3 g/dL — ABNORMAL LOW (ref 3.5–5.0)
Alkaline Phosphatase: 79 U/L (ref 38–126)
Anion gap: 8 (ref 5–15)
BUN: 41 mg/dL — ABNORMAL HIGH (ref 8–23)
CO2: 22 mmol/L (ref 22–32)
Calcium: 9 mg/dL (ref 8.9–10.3)
Chloride: 106 mmol/L (ref 98–111)
Creatinine, Ser: 1.55 mg/dL — ABNORMAL HIGH (ref 0.61–1.24)
GFR calc Af Amer: 48 mL/min — ABNORMAL LOW (ref >60)
GFR calc non Af Amer: 41 mL/min — ABNORMAL LOW (ref >60)
Glucose, Bld: 82 mg/dL (ref 70–99)
Potassium: 4.3 mmol/L (ref 3.5–5.1)
Sodium: 136 mmol/L (ref 135–145)
Total Bilirubin: 0.4 mg/dL (ref 0.3–1.2)
Total Protein: 8 g/dL (ref 6.5–8.1)

## 2019-03-02 LAB — TSH: TSH: 4.153 u[IU]/mL (ref 0.350–4.500)

## 2019-03-02 NOTE — Progress Notes (Signed)
DISCONTINUE ON PATHWAY REGIMEN - Non-Small Cell Lung  No Medical Intervention - Off Treatment.  REASON: Disease Progression PRIOR TREATMENT: Lung285: Referral to Radiation Oncology  START OFF PATHWAY REGIMEN - Non-Small Cell Lung   [id number]:Pembrolizumab 200 mg q21 Days:   A cycle is 21 days:     Pembrolizumab   **Always confirm dose/schedule in your pharmacy ordering system**  Patient Characteristics: Local Recurrence AJCC T Category: T4 Current Disease Status: Local Recurrence AJCC N Category: N3 AJCC M Category: M0 AJCC 8 Stage Grouping: IIIC Intent of Therapy: Non-Curative / Palliative Intent, Discussed with Patient

## 2019-03-02 NOTE — Progress Notes (Signed)
Patient here for CT scan results.

## 2019-03-02 NOTE — Progress Notes (Signed)
Hematology/Oncology follow up note [facility] [phone] [phone]   Patient Care Team: Casilda Carls, MD as PCP - General (Internal Medicine) Telford Nab, RN as Registered Nurse  REFERRING PROVIDER: Dr.Malinda  CHIEF COMPLAINTS/REASON FOR VISIT:  Evaluation of lung mass  HISTORY OF PRESENTING ILLNESS:  Patrick Harding is a  85 y.o.  male with PMH listed below who was referred to me for evaluation of lung mass.   Patient recently presented emergency room for evaluation of fever of 101, nonproductive cough and profound weakness. CT done in the emergency room 10/08/2018 showed central left lung mass in the suprahilar region extending into AP window measuring approximately 3 x 4 x 6 cm, larger compared to previous exams.   Patient has been seen by Dr. Felicie Morn previously for lung mass. Patient's previous image work-up includes PET scan done 06/18/2017 which showed hypermetabolic right hilar mass or indeterminate etiology.  Favor bronchogenic carcinoma or lymphoma.  Mild metabolic activity of mediastinal lymph nodes is indeterminate.  Resolution of right middle lobe nodularity suggest infectious or inflammatory process.  Hypermetabolic asymmetric endplate lytic lesion at S1 with metabolic activity appear.  Favor benign process but cannot exclude solitary metastasis. Patient was seen and evaluated by Dr. Felicie Morn and had 01/28/2018 EBUS guided needle biopsy taking and several lymph node stations.  Patient also had a left upper lobe transbronchial cytology brushing, washing/BAL, removal of mucous plug from both lungs. Biopsy pathology was negative.  Per Dr. Ashby Dawes, patient's case was discussed on tumor board. Consensus reached to repeat a PET scan in 3 to 4 months for reevaluation and possible rebiopsy.  Patient had called office consult his follow-up appointments for PET scan and had not seen Dr. Felicie Morn since then.  #Today patient was  accompanied by wife and daughter to clinic to discuss CT scan and management plan. Reports feeling weak and fatigued.  Appetite has decreased.  His weight appears stable since February this year. Lives at home with wife. Mild shortness of breath with exertion.  Per family pack, patient does not walk much.  History of stroke in 2017, residual right sided weakness.  History of chronic systolic CHF, Neihart class III, global LVEF 25% [12/08/2015 2D echo] On Eliquis 2.5 twice daily for anticoagulation for risk reduction of stroke with atrial fibrillation/flutter.  # S/p EBUS biopsy.  Left upper lobe biopsy showed respiratory mucosa with areas of squamous metaplasia and rare atypical cells.  Left hilar mass: EBUS positive for malignancy. 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  Paratracheal mass: suspicious for malignancy.  Lung left upper lobe, BAL: non diagnostic.  Left upper lobe brushing negative for malignancy.   # He finished definitive radiation on 01/22/2019.    INTERVAL HISTORY Patrick Harding is a 85 y.o. male who has above history reviewed by me today presents for follow up visit for management of lung cancer. Patient has CT scan done and presents to discuss results. Accompanied by wife.  He denies SOB more than his baseline. Has had chronic cough, productive with whitish sputum.  He continues to feel tired and fatigued.  Lost 2 pounds during the interval.  He takes Megace 53m BID     Review of Systems  Constitutional: Positive for malaise/fatigue. Negative for chills, fever and weight loss.  HENT: Negative for nosebleeds and sore throat.   Eyes: Negative for double vision and redness.  Respiratory: Positive for cough. Negative for shortness of breath and wheezing.   Cardiovascular: Negative  for chest pain, palpitations, orthopnea and leg swelling.  Gastrointestinal: Negative for abdominal pain, blood in stool, nausea and vomiting.    Genitourinary: Negative for dysuria and frequency.  Musculoskeletal: Negative for myalgias.       Right shoulder pain.   Skin: Negative for itching and rash.  Neurological: Negative for dizziness, tingling, tremors and weakness.  Endo/Heme/Allergies: Negative for environmental allergies. Does not bruise/bleed easily.  Psychiatric/Behavioral: Negative for hallucinations.    MEDICAL HISTORY:  Past Medical History:  Diagnosis Date   Anemia    Aortic aneurysm without rupture ([place]) 12/26/2015   Arrhythmia    Atrial fibrillation (HCC)    Benign neoplasm of cecum    Benign neoplasm of transverse colon    Blood in stool    Cachexia ([place]) 05/11/2017   CAP (community acquired pneumonia) 7/41/6384   Chronic systolic CHF (congestive heart failure), NYHA class 3 ([place]) 12/26/2015   Overview:  Global ef 25%   COPD (chronic obstructive pulmonary disease) ([place])    Dysarthria due to recent stroke 03/12/2016   Dyspnea on exertion 01/03/2016   Dysrhythmia    atrial fib.   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 Thus, I elected to increase the dose of metoprolol to 50 mg twice daily. Continue anticoagulation. He is complaining of increased dyspnea but his oxygen saturation is 95% on room air and his lungs are relatively clear but he does have diminished breath sounds. EKG does not show any new changes. Once his heart rate i   Pressure  ulcer 03/21/2016   Rectal polyp    Reflux esophagitis    Renal insufficiency    Sacroiliac joint dysfunction 08/10/2015   Stroke determined by clinical assessment ([place]) 03/12/2016   Thoracic ascending aortic aneurysm ([place]) 01/11/2016   Overview:  4.8cm 12/2015   Vitamin D deficiency    Weakness of both legs 12/26/2015    SURGICAL HISTORY: Past Surgical History:  Procedure Laterality Date   BACK SURGERY     lumbar   COLONOSCOPY WITH PROPOFOL N/A 07/17/2016   Procedure: COLONOSCOPY WITH PROPOFOL;  Surgeon: [name], MD;  Location: [facility] ENDOSCOPY;  Service: Endoscopy;  Laterality: N/A;   ENDOBRONCHIAL ULTRASOUND N/A 01/27/2018   Procedure: ENDOBRONCHIAL ULTRASOUND;  Surgeon: [name], MD;  Location: [facility] ORS;  Service: Pulmonary;  Laterality: N/A;   ENDOBRONCHIAL ULTRASOUND Left 11/04/2018   Procedure: ENDOBRONCHIAL ULTRASOUND;  Surgeon: [name], MD;  Location: [facility] ORS;  Service: Pulmonary;  Laterality: Left;   ESOPHAGOGASTRODUODENOSCOPY (EGD) WITH PROPOFOL N/A 07/17/2016   Procedure: ESOPHAGOGASTRODUODENOSCOPY (EGD) WITH PROPOFOL;  Surgeon: [name], MD;  Location: [facility] ENDOSCOPY;  Service: Endoscopy;  Laterality: N/A;   EYE SURGERY     cataract right   FACIAL FRACTURE SURGERY     Head surgery Left    TONSILLECTOMY      SOCIAL HISTORY: Social History   Socioeconomic History   Marital status: Married    Spouse name: Not on file   Number of children: Not on  file   Years of education: Not on file   Highest education level: Not on file  Occupational History   Not on file  Social Needs   Financial resource strain: Not on file   Food insecurity:    Worry: Not on file    Inability: Not on file   Transportation needs:    Medical: Not on file    Non-medical: Not on file  Tobacco Use   Smoking status: Former Smoker    Packs/day: 0.50    Years: 20.00    Pack years: 10.00    Types: Cigarettes    Last attempt to quit: 10/24/2015     Years since quitting: 3.3   Smokeless tobacco: Never Used  Substance and Sexual Activity   Alcohol use: No    Alcohol/week: 0.0 standard drinks    Comment: quit smoking about 4 years ago   Drug use: No   Sexual activity: Not on file  Lifestyle   Physical activity:    Days per week: Not on file    Minutes per session: Not on file   Stress: Not on file  Relationships   Social connections:    Talks on phone: Not on file    Gets together: Not on file    Attends religious service: Not on file    Active member of club or organization: Not on file    Attends meetings of clubs or organizations: Not on file    Relationship status: Not on file   Intimate partner violence:    Fear of current or ex partner: Not on file    Emotionally abused: Not on file    Physically abused: Not on file    Forced sexual activity: Not on file  Other Topics Concern   Not on file  Social History Narrative   Not on file    FAMILY HISTORY: Family History  Problem Relation Age of Onset   Heart failure Mother    Arthritis Father    Bladder Cancer Neg Hx    Kidney cancer Neg Hx    Prolactinoma Neg Hx    Prostate cancer Neg Hx     ALLERGIES:  is allergic to aspirin and penicillins.  MEDICATIONS:  Current Outpatient Medications  Medication Sig Dispense Refill   acetaminophen (TYLENOL) 500 MG tablet Take 1,000 mg by mouth every 6 (six) hours as needed for mild pain or headache.     AMBULATORY NON FORMULARY MEDICATION Medication Name: nebulizer and supplies DX:J43.1 1 each 0   apixaban (ELIQUIS) 2.5 MG TABS tablet Take 1.25 mg by mouth daily.      dextromethorphan-guaiFENesin (MUCINEX DM) 30-600 MG 12hr tablet Take 1 tablet by mouth 2 (two) times daily. 30 tablet 0   diltiazem (CARDIZEM CD) 180 MG 24 hr capsule Take 180 mg by mouth daily.     guaifenesin (ROBITUSSIN) 100 MG/5ML syrup Take 10 mLs (200 mg total) by mouth 3 (three) times daily as needed for cough. 236 mL 0    ipratropium-albuterol (DUONEB) 0.5-2.5 (3) MG/3ML SOLN Take 3 mLs by nebulization every 8 (eight) hours. And prn (Patient taking differently: Take 3 mLs by nebulization every 8 (eight) hours as needed (for shortness of breath or wheezing). ) 360 mL 11   megestrol (MEGACE) 40 MG tablet Take 1 tablet (40 mg total) by mouth 2 (two) times daily. 60 tablet 3   metoprolol (LOPRESSOR) 50 MG tablet Take 1 tablet (50 mg total) by mouth 2 (two) times daily. 60 tablet 2  Multiple Vitamin (MULTI-VITAMINS) TABS Take 2 tablets by mouth daily.      oxyCODONE (OXY IR/ROXICODONE) 5 MG immediate release tablet Take 1 tablet (5 mg total) by mouth every 6 (six) hours as needed for severe pain. 30 tablet 0   No current facility-administered medications for this visit.      PHYSICAL EXAMINATION: ECOG PERFORMANCE STATUS: 2 - Symptomatic, <50% confined to bed Vitals:   03/02/19 1007  BP: 103/60  Pulse: 73  Resp: 18  Temp: (!) 96.8 F (36 C)  SpO2: 99%   Filed Weights   03/02/19 1007  Weight: 129 lb (58.5 kg)    Physical Exam Constitutional:      General: He is not in acute distress.    Appearance: He is ill-appearing.     Comments: Thin elderly male, chronic ill appearance, sitting in wheelchair. Cachectic.   HENT:     Head: Normocephalic and atraumatic.  Eyes:     General: No scleral icterus.    Pupils: Pupils are equal, round, and reactive to light.  Neck:     Musculoskeletal: Normal range of motion and neck supple.  Cardiovascular:     Rate and Rhythm: Normal rate and regular rhythm.     Heart sounds: Normal heart sounds.  Pulmonary:     Effort: Pulmonary effort is normal. No respiratory distress.     Breath sounds: No wheezing or rales.     Comments: Severely decreased breath sound bilaterally. Abdominal:     General: Bowel sounds are normal. There is no distension.     Palpations: Abdomen is soft. There is no mass.     Tenderness: There is no abdominal tenderness.    Musculoskeletal: Normal range of motion.        General: No deformity.  Skin:    General: Skin is warm and dry.     Findings: No erythema or rash.  Neurological:     Mental Status: He is alert and oriented to person, place, and time.     Cranial Nerves: No cranial nerve deficit.     Coordination: Coordination normal.  Psychiatric:        Behavior: Behavior normal.        Thought Content: Thought content normal.     Comments: Flat affect      LABORATORY DATA:  I have reviewed the data as listed Lab Results  Component Value Date   WBC 7.4 03/02/2019   HGB 9.3 (L) 03/02/2019   HCT 28.5 (L) 03/02/2019   MCV 86.1 03/02/2019   PLT 281 03/02/2019   Recent Labs    10/08/18 1553  01/20/19 1331 02/13/19 0932 03/02/19 0946  NA 136   < > 137 133* 136  K 4.6   < > 4.2 4.5 4.3  CL 103   < > 107 106 106  CO2 26   < > 23 21* 22  GLUCOSE 102*   < > 113* 94 82  BUN 25*   < > 36* 37* 41*  CREATININE 1.74*   < > 1.55* 1.31* 1.55*  CALCIUM 8.5*   < > 8.8* 8.4* 9.0  GFRNONAA 35*   < > 41* 50* 41*  GFRAA 40*   < > 48* 58* 48*  PROT 7.6   < > 7.9 7.8 8.0  ALBUMIN 3.1*   < > 3.3* 3.2* 3.3*  AST 23   < > _0 ALT 18   < > _1 ALKPHOS 96   < >  79 63 79  BILITOT 0.9   < > 0.2* 0.5 0.4  BILIDIR 0.3*  --   --   --   --   IBILI 0.6  --   --   --   --    < > = values in this interval not displayed.   Iron/TIBC/Ferritin/ %Sat No results found for: IRON, TIBC, FERRITIN, IRONPCTSAT    RADIOGRAPHIC STUDIES: I have personally reviewed the radiological images as listed and agreed with the findings in the report. # 10/08/2018 CT chest w contrast Central left lung mass in the suprahilar region extending into the AP window measuring approximately 3.4 x 6 cm likely larger compared to the previous exams. Found to be hypermetabolic on previous PET-CT and likely represents bronchogenic carcinoma. Adjacent mediastinal adenopathy as described. Several small pulmonary nodules bilaterally  which may be due to metastatic disease although could be seen with atypical infectious or inflammatory processes. Recommend attention on follow-up.  Aneurysmal dilatation of the ascending thoracic aorta measuring 4.8 cm in AP diameter without significant change. Aneurysmal dilatation of the descending thoracic aorta measuring 4.8 cm in AP diameter. Ascending thoracic aortic aneurysm. Recommend semi-annual imaging followup by CTA or MRA and referral to cardiothoracic surgery if not already obtained. This recommendation follows 2010 ACCF/AHA/AATS/ACR/ASA/SCA/SCAI/SIR/STS/SVM Guidelines for the Diagnosis and Management of Patients With Thoracic Aortic Disease. Circulation. 2010; 121: Q761-P509.   ASSESSMENT & PLAN:  1. Non-small cell cancer of left lung ([place])   2. Cachexia ([place])   3. Weight loss, abnormal   4. Encounter for antineoplastic immunotherapy    #Non-small cell lung cancer, T4N3, Stage IIIC,  possible M1 disease if bilateral axillary and porta cava node hypermetabolic activity are counted.At least stage IIIc, or stage IV disease  02/27/2019 CT chest without contrast was independently reviewed by me and discussed with patient. Mixed response. The previously noted left upper lobe perihilar mass has significantly decreased in size, previously noted lymphadenopathy has regressed, there are multiple new and enlarging left side pulmonary nodule, most evident in the left upper lobe, concerning for metastatic lesion. Mild diffuse bronchial wall thickening may suggest chronic bronchitis. Aortic atherosclerosis.  Multiple aneurysm dilation of the thoracic abdominal aorta, most severe in the ascending aorta which measures up to 5.3 cm in diameter.  I had a lengthy discussion with patient and his wife.  CT scan shows mixed response.  New lung nodules may reflect metastatic disease or secondary to recent bronchitis. I will touch base with radiation oncology to see if patient will be candidate for  additional radiation.  If not, I recommend starting systemic immunotherapy with Keytruda.  Patient expresses 80% PDL 1  I discussed the mechanism of action and rationale of using immunotherapy.  The goal of therapy is palliative; and length of treatments are likely ongoing/based upon the results of the scans. Discussed the potential side effects of immunotherapy including but not limited to diarrhea; skin rash; respiratory failure, neurotoxicity, elevated LFTs/endocrine abnormalities etc. Patient will be scheduled for chemotherapy class. We will obtain baseline TSH today. If no additional radiation is indicated, anticipate to start immunotherapy treatment later this week or early next week.  Weight loss continue Megace 40 mg twice daily.   Follows up with dietitian.  #Addendum, discussed with radiation oncology Dr. Baruch Gouty.  No plans for additional radiation at this point.  Proceed with immunotherapy with Keytruda this week.  Orders Placed This Encounter  Procedures   CBC with Differential/Platelet    Standing Status:   Future  Number of Occurrences:   1    Standing Expiration Date:   03/01/2020   Comprehensive metabolic panel    Standing Status:   Future    Number of Occurrences:   1    Standing Expiration Date:   03/01/2020   TSH    Standing Status:   Future    Number of Occurrences:   1    Standing Expiration Date:   04/02/2019   Follow up:  03/17/2019 We spent sufficient time to discuss many aspect of care, questions were answered to patient's satisfaction. Total face to face encounter time for this patient visit was 25 min. >50% of the time was  spent in counseling and coordination of care.   Return of visit:  Earlie Server, MD, PhD Hematology Oncology [facility] at [facility] Pager- [phone] 03/02/2019

## 2019-03-02 NOTE — Progress Notes (Signed)
Non-Small Cell Lung - No Medical Intervention - Off Treatment.  Patient Characteristics: Stage III - Unresectable, PS ? 2 AJCC T Category: T4 Current Disease Status: No Distant Mets or Local Recurrence AJCC N Category: N3 AJCC M Category: M0 AJCC 8 Stage Grouping: IIIC ECOG Performance Status: 2

## 2019-03-02 NOTE — Telephone Encounter (Signed)
Ensure was given to pt during appt this morning

## 2019-03-05 ENCOUNTER — Other Ambulatory Visit: Payer: Medicare HMO

## 2019-03-05 ENCOUNTER — Other Ambulatory Visit: Payer: Self-pay

## 2019-03-05 NOTE — Patient Instructions (Signed)
Pembrolizumab injection  What is this medicine?  PEMBROLIZUMAB (pem broe liz ue mab) is a monoclonal antibody. It is used to treat cervical cancer, esophageal cancer, head and neck cancer, hepatocellular cancer, Hodgkin lymphoma, kidney cancer, lymphoma, melanoma, Merkel cell carcinoma, lung cancer, stomach cancer, urothelial cancer, and cancers that have a certain genetic condition.  This medicine may be used for other purposes; ask your health care provider or pharmacist if you have questions.  COMMON BRAND NAME(S): Keytruda  What should I tell my health care provider before I take this medicine?  They need to know if you have any of these conditions:  -diabetes  -immune system problems  -inflammatory bowel disease  -liver disease  -lung or breathing disease  -lupus  -received or scheduled to receive an organ transplant or a stem-cell transplant that uses donor stem cells  -an unusual or allergic reaction to pembrolizumab, other medicines, foods, dyes, or preservatives  -pregnant or trying to get pregnant  -breast-feeding  How should I use this medicine?  This medicine is for infusion into a vein. It is given by a health care professional in a hospital or clinic setting.  A special MedGuide will be given to you before each treatment. Be sure to read this information carefully each time.  Talk to your pediatrician regarding the use of this medicine in children. While this drug may be prescribed for selected conditions, precautions do apply.  Overdosage: If you think you have taken too much of this medicine contact a poison control center or emergency room at once.  NOTE: This medicine is only for you. Do not share this medicine with others.  What if I miss a dose?  It is important not to miss your dose. Call your doctor or health care professional if you are unable to keep an appointment.  What may interact with this medicine?  Interactions have not been studied.  Give your health care provider a list of all the  medicines, herbs, non-prescription drugs, or dietary supplements you use. Also tell them if you smoke, drink alcohol, or use illegal drugs. Some items may interact with your medicine.  This list may not describe all possible interactions. Give your health care provider a list of all the medicines, herbs, non-prescription drugs, or dietary supplements you use. Also tell them if you smoke, drink alcohol, or use illegal drugs. Some items may interact with your medicine.  What should I watch for while using this medicine?  Your condition will be monitored carefully while you are receiving this medicine.  You may need blood work done while you are taking this medicine.  Do not become pregnant while taking this medicine or for 4 months after stopping it. Women should inform their doctor if they wish to become pregnant or think they might be pregnant. There is a potential for serious side effects to an unborn child. Talk to your health care professional or pharmacist for more information. Do not breast-feed an infant while taking this medicine or for 4 months after the last dose.  What side effects may I notice from receiving this medicine?  Side effects that you should report to your doctor or health care professional as soon as possible:  -allergic reactions like skin rash, itching or hives, swelling of the face, lips, or tongue  -bloody or black, tarry  -breathing problems  -changes in vision  -chest pain  -chills  -confusion  -constipation  -cough  -diarrhea  -dizziness or feeling faint or lightheaded  -  fast or irregular heartbeat  -fever  -flushing  -hair loss  -joint pain  -low blood counts - this medicine may decrease the number of white blood cells, red blood cells and platelets. You may be at increased risk for infections and bleeding.  -muscle pain  -muscle weakness  -persistent headache  -redness, blistering, peeling or loosening of the skin, including inside the mouth  -signs and symptoms of high blood sugar  such as dizziness; dry mouth; dry skin; fruity breath; nausea; stomach pain; increased hunger or thirst; increased urination  -signs and symptoms of kidney injury like trouble passing urine or change in the amount of urine  -signs and symptoms of liver injury like dark urine, light-colored stools, loss of appetite, nausea, right upper belly pain, yellowing of the eyes or skin  -sweating  -swollen lymph nodes  -weight loss  Side effects that usually do not require medical attention (report to your doctor or health care professional if they continue or are bothersome):  -decreased appetite  -muscle pain  -tiredness  This list may not describe all possible side effects. Call your doctor for medical advice about side effects. You may report side effects to FDA at 1-800-FDA-1088.  Where should I keep my medicine?  This drug is given in a hospital or clinic and will not be stored at home.  NOTE: This sheet is a summary. It may not cover all possible information. If you have questions about this medicine, talk to your doctor, pharmacist, or health care provider.   2019 Elsevier/Gold Standard (2018-07-10 15:06:10)

## 2019-03-06 ENCOUNTER — Other Ambulatory Visit: Payer: Self-pay

## 2019-03-06 ENCOUNTER — Inpatient Hospital Stay: Payer: Medicare HMO

## 2019-03-09 ENCOUNTER — Other Ambulatory Visit: Payer: Self-pay

## 2019-03-10 ENCOUNTER — Ambulatory Visit: Payer: Medicare HMO

## 2019-03-10 ENCOUNTER — Other Ambulatory Visit: Payer: Medicare HMO

## 2019-03-10 ENCOUNTER — Other Ambulatory Visit: Payer: Self-pay | Admitting: *Deleted

## 2019-03-10 ENCOUNTER — Other Ambulatory Visit: Payer: Self-pay

## 2019-03-10 ENCOUNTER — Inpatient Hospital Stay (HOSPITAL_BASED_OUTPATIENT_CLINIC_OR_DEPARTMENT_OTHER): Payer: Medicare HMO | Admitting: Hospice and Palliative Medicine

## 2019-03-10 ENCOUNTER — Telehealth (INDEPENDENT_AMBULATORY_CARE_PROVIDER_SITE_OTHER): Payer: Self-pay

## 2019-03-10 ENCOUNTER — Ambulatory Visit: Payer: Medicare HMO | Admitting: Oncology

## 2019-03-10 ENCOUNTER — Inpatient Hospital Stay: Payer: Medicare HMO

## 2019-03-10 DIAGNOSIS — C3412 Malignant neoplasm of upper lobe, left bronchus or lung: Secondary | ICD-10-CM

## 2019-03-10 DIAGNOSIS — J449 Chronic obstructive pulmonary disease, unspecified: Secondary | ICD-10-CM

## 2019-03-10 DIAGNOSIS — C3492 Malignant neoplasm of unspecified part of left bronchus or lung: Secondary | ICD-10-CM

## 2019-03-10 DIAGNOSIS — I10 Essential (primary) hypertension: Secondary | ICD-10-CM

## 2019-03-10 DIAGNOSIS — I5022 Chronic systolic (congestive) heart failure: Secondary | ICD-10-CM

## 2019-03-10 DIAGNOSIS — Z87891 Personal history of nicotine dependence: Secondary | ICD-10-CM

## 2019-03-10 DIAGNOSIS — Z79899 Other long term (current) drug therapy: Secondary | ICD-10-CM

## 2019-03-10 LAB — COMPREHENSIVE METABOLIC PANEL
ALT: 14 U/L (ref 0–44)
AST: 19 U/L (ref 15–41)
Albumin: 3.4 g/dL — ABNORMAL LOW (ref 3.5–5.0)
Alkaline Phosphatase: 81 U/L (ref 38–126)
Anion gap: 9 (ref 5–15)
BUN: 49 mg/dL — ABNORMAL HIGH (ref 8–23)
CO2: 23 mmol/L (ref 22–32)
Calcium: 9.3 mg/dL (ref 8.9–10.3)
Chloride: 105 mmol/L (ref 98–111)
Creatinine, Ser: 1.68 mg/dL — ABNORMAL HIGH (ref 0.61–1.24)
GFR calc non Af Amer: 37 mL/min — ABNORMAL LOW (ref >60)
GFR, EST AFRICAN AMERICAN: 43 mL/min — AB (ref >60)
Glucose, Bld: 78 mg/dL (ref 70–99)
POTASSIUM: 4.5 mmol/L (ref 3.5–5.1)
Sodium: 137 mmol/L (ref 135–145)
Total Bilirubin: 0.3 mg/dL (ref 0.3–1.2)
Total Protein: 8.3 g/dL — ABNORMAL HIGH (ref 6.5–8.1)

## 2019-03-10 LAB — CBC WITH DIFFERENTIAL/PLATELET
ABS IMMATURE GRANULOCYTES: 0.05 10*3/uL (ref 0.00–0.07)
Basophils Absolute: 0 10*3/uL (ref 0.0–0.1)
Basophils Relative: 0 %
Eosinophils Absolute: 0.4 10*3/uL (ref 0.0–0.5)
Eosinophils Relative: 6 %
HCT: 32.2 % — ABNORMAL LOW (ref 39.0–52.0)
Hemoglobin: 10.5 g/dL — ABNORMAL LOW (ref 13.0–17.0)
Immature Granulocytes: 1 %
LYMPHS ABS: 1.1 10*3/uL (ref 0.7–4.0)
Lymphocytes Relative: 16 %
MCH: 28.2 pg (ref 26.0–34.0)
MCHC: 32.6 g/dL (ref 30.0–36.0)
MCV: 86.6 fL (ref 80.0–100.0)
Monocytes Absolute: 1.1 10*3/uL — ABNORMAL HIGH (ref 0.1–1.0)
Monocytes Relative: 15 %
NEUTROS ABS: 4.6 10*3/uL (ref 1.7–7.7)
Neutrophils Relative %: 62 %
Platelets: 225 10*3/uL (ref 150–400)
RBC: 3.72 MIL/uL — ABNORMAL LOW (ref 4.22–5.81)
RDW: 16.5 % — ABNORMAL HIGH (ref 11.5–15.5)
WBC: 7.2 10*3/uL (ref 4.0–10.5)
nRBC: 0 % (ref 0.0–0.2)

## 2019-03-10 NOTE — Telephone Encounter (Signed)
Spoke with the patient's wife and gave her all pre-procedure instructions along with the Eliquis and to not take it tomorrow and the zero visitor policy as well. Patient is scheduled for 03/11/2019 with [facility] and a 11:30 am arrival time.

## 2019-03-10 NOTE — Progress Notes (Signed)
Creatinine: 1.68. MD, Dr. Tasia Catchings, notified and aware. Per MD order: proceed with scheduled Keytruda treatment today.  1200- Unable to obtain peripheral IV access. Patient states, "Patrick Harding are not sticking me anymore today." MD, Dr. Tasia Catchings, notified and came to chair side to talk to patient. Per MD order: hold Keytruda treatment today and patient may be discharged to home. MD is going to send referral for patient to have port-a-cath placement. Per MD order: Patient will be rescheduled for [facility] treatment after port-a-cath placement. Scheduling will be in touch with patient regarding future appointments. Patient and patient's wife informed and verbalized understanding.

## 2019-03-10 NOTE — Progress Notes (Signed)
[place]  [phone] [phone]  Patient Care Team: Casilda Carls, MD as PCP - General (Internal Medicine) Telford Nab, RN as Registered Nurse   Name of the patient: Patrick Harding  [id number]  Nov 18, 1934   Date of Visit: 03/10/19  Diagnosis: Stage III NSCLC  Current Treatment: Pembrolizumab   Reason for Visit: This patient is a 85 y.o. male who presents to chemo care clinic today for initial meeting in preparation for starting chemotherapy. I introduced the chemo care clinic and we discussed that the role of the clinic is to assist those who are at an increased risk of emergency room visits and/or complications during the course of chemotherapy treatment. We discussed that the increased risk takes into account factors such as age, performance status, and co-morbidities. We also discussed that for some, this might include barriers to care such as not having a primary care provider, lack of insurance/transportation, or not being able to afford medications. We discussed that the goal of the program is to help prevent unplanned ER visits and help reduce complications during chemotherapy. We do this by discussing specific risk factors to each individual and identifying ways that we can help improve these risk factors and reduce barriers to care.   Hematology/Oncology History:    Non-small cell cancer of left lung ([place])   03/02/2019 Initial Diagnosis    Non-small cell cancer of left lung ([place])    03/16/2019 -  Chemotherapy    The patient had pembrolizumab (KEYTRUDA) 200 mg in sodium chloride 0.9 % 50 mL chemo infusion, 200 mg, Intravenous, Once, 0 of 6 cycles  for chemotherapy treatment.       Allergies  Allergen Reactions   Aspirin Itching   Penicillins Hives, Itching and Other (See Comments)    Has patient had a PCN reaction causing immediate rash, facial/tongue/throat swelling, SOB or lightheadedness with  hypotension: no Has patient had a PCN reaction causing severe rash involving mucus membranes or skin necrosis: no Has patient had a PCN reaction that required hospitalization no Has patient had a PCN reaction occurring within the last 10 years: no If all of the above answers are "NO", then may proceed with Cephalosporin use.      Past Medical History:  Diagnosis Date   Anemia    Aortic aneurysm without rupture ([place]) 12/26/2015   Arrhythmia    Atrial fibrillation ([facility])    Benign neoplasm of cecum    Benign neoplasm of transverse colon    Blood in stool    Cachexia ([place]) 05/11/2017   CAP (community acquired pneumonia) 1/82/9937   Chronic systolic CHF (congestive heart failure), NYHA class 3 ([place]) 12/26/2015   Overview:  Global ef 25%   COPD (chronic obstructive pulmonary disease) ([place])    Dysarthria due to recent stroke 03/12/2016   Dyspnea on exertion 01/03/2016   Dysrhythmia    atrial fib.   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 Thus, I elected to increase the dose of metoprolol to 50 mg twice daily. Continue anticoagulation. He is complaining of increased dyspnea but his oxygen saturation is 95% on room air and his lungs are relatively clear but he does have diminished breath sounds. EKG does not show any new changes. Once his heart rate i   Pressure ulcer  03/21/2016   Rectal polyp    Reflux esophagitis    Renal insufficiency    Sacroiliac joint dysfunction 08/10/2015   Stroke determined by clinical assessment ([place]) 03/12/2016   Thoracic ascending aortic aneurysm ([place]) 01/11/2016   Overview:  4.8cm 12/2015   Vitamin D deficiency    Weakness of both legs 12/26/2015     Past Surgical History:  Procedure Laterality Date   BACK SURGERY     lumbar   COLONOSCOPY WITH PROPOFOL N/A 07/17/2016   Procedure: COLONOSCOPY WITH PROPOFOL;  Surgeon: [name], MD;  Location: [facility] ENDOSCOPY;  Service: Endoscopy;  Laterality: N/A;   ENDOBRONCHIAL ULTRASOUND N/A 01/27/2018   Procedure: ENDOBRONCHIAL ULTRASOUND;  Surgeon: [name], MD;  Location: [facility] ORS;  Service: Pulmonary;  Laterality: N/A;   ENDOBRONCHIAL ULTRASOUND Left 11/04/2018   Procedure: ENDOBRONCHIAL ULTRASOUND;  Surgeon: [name], MD;  Location: [facility] ORS;  Service: Pulmonary;  Laterality: Left;   ESOPHAGOGASTRODUODENOSCOPY (EGD) WITH PROPOFOL N/A 07/17/2016   Procedure: ESOPHAGOGASTRODUODENOSCOPY (EGD) WITH PROPOFOL;  Surgeon: [name], MD;  Location: [facility] ENDOSCOPY;  Service: Endoscopy;  Laterality: N/A;   EYE SURGERY     cataract right   FACIAL FRACTURE SURGERY     Head surgery Left    TONSILLECTOMY      Social History   Socioeconomic History   Marital status: Married    Spouse name: Not on file   Number of children: Not on file   Years of education: Not on file   Highest education level: Not on file  Occupational History   Not on file  Social Needs   Financial resource strain: Not on file   Food insecurity:    Worry: Not on file    Inability: Not on file   Transportation needs:    Medical: Not on file    Non-medical: Not on file  Tobacco Use   Smoking status: Former Smoker    Packs/day: 0.50    Years: 20.00    Pack years: 10.00    Types: Cigarettes    Last attempt to quit: 10/24/2015    Years since quitting: 3.3    Smokeless tobacco: Never Used  Substance and Sexual Activity   Alcohol use: No    Alcohol/week: 0.0 standard drinks    Comment: quit smoking about 4 years ago   Drug use: No   Sexual activity: Not on file  Lifestyle   Physical activity:    Days per week: Not on file    Minutes per session: Not on file   Stress: Not on file  Relationships   Social connections:    Talks on phone: Not on file    Gets together: Not on file    Attends religious service: Not on file    Active member of club or organization: Not on file    Attends meetings of clubs or organizations: Not on file    Relationship status: Not on file   Intimate partner violence:    Fear of current or ex partner: Not on file    Emotionally abused: Not on file  Physically abused: Not on file    Forced sexual activity: Not on file  Other Topics Concern   Not on file  Social History Narrative   Not on file    Family History  Problem Relation Age of Onset   Heart failure Mother    Arthritis Father    Bladder Cancer Neg Hx    Kidney cancer Neg Hx    Prolactinoma Neg Hx    Prostate cancer Neg Hx     Current Outpatient Medications  Medication Sig Dispense Refill   acetaminophen (TYLENOL) 500 MG tablet Take 1,000 mg by mouth every 6 (six) hours as needed for mild pain or headache.     AMBULATORY NON FORMULARY MEDICATION Medication Name: nebulizer and supplies DX:J43.1 1 each 0   apixaban (ELIQUIS) 2.5 MG TABS tablet Take 1.25 mg by mouth daily.      dextromethorphan-guaiFENesin (MUCINEX DM) 30-600 MG 12hr tablet Take 1 tablet by mouth 2 (two) times daily. 30 tablet 0   diltiazem (CARDIZEM CD) 180 MG 24 hr capsule Take 180 mg by mouth daily.     guaifenesin (ROBITUSSIN) 100 MG/5ML syrup Take 10 mLs (200 mg total) by mouth 3 (three) times daily as needed for cough. 236 mL 0   ipratropium-albuterol (DUONEB) 0.5-2.5 (3) MG/3ML SOLN Take 3 mLs by nebulization every 8 (eight) hours. And prn (Patient  taking differently: Take 3 mLs by nebulization every 8 (eight) hours as needed (for shortness of breath or wheezing). ) 360 mL 11   megestrol (MEGACE) 40 MG tablet Take 1 tablet (40 mg total) by mouth 2 (two) times daily. 60 tablet 3   metoprolol (LOPRESSOR) 50 MG tablet Take 1 tablet (50 mg total) by mouth 2 (two) times daily. 60 tablet 2   Multiple Vitamin (MULTI-VITAMINS) TABS Take 2 tablets by mouth daily.      oxyCODONE (OXY IR/ROXICODONE) 5 MG immediate release tablet Take 1 tablet (5 mg total) by mouth every 6 (six) hours as needed for severe pain. 30 tablet 0   No current facility-administered medications for this visit.      PERFORMANCE STATUS (ECOG) : 2 - Symptomatic, <50% confined to bed  Review of Systems As noted above. Otherwise, a complete review of systems is negative.  Physical Exam General: NAD, frail appearing, thin Pulmonary: Unlabored Extremities: no edema, no joint deformities Skin: no rashes Neurological: Weakness, chronically slurred speech, alert and oriented   Assessment and Plan:    1. Cancer: Stage III NSCLC on treatment with Pembrolizumab. Followed by Dr. Tasia Catchings.  2. High Risk for ER/Hospitalization during Chemotherapy: We discussed the role of the chemo care clinic and identified patient specific risk factors. I discussed that patient was identified as high risk primarily based on: co morbidities. We also discussed the role of the Symptom Management and Palliative Care Clinics at [facility] and methods of contacting clinic/provider. He denies needing specific assistance at this time.  Current PCP: Casilda Carls, MD  Hospital Admissions: 0  ED Visits: 3  Has Medicaid: No  Has Medicare: Yes  In relationship: Yes  Has Anemia: Yes  Has asthma: No  Has atrial fibrillation: Yes  Has CVD: Yes  Has chronic kidney disease: No  Has Chronic Obstructive Pulmonary Disease: Yes  Has Congestive Heart Failure: Yes  Has Connective Tissue Disorder: No  Has Depression:  No  Has Diabetes: No  Has liver disease: No  Has Peripheral Vascular Disease: No    3. Social Determinants of Health:   Housing -patient  lives at home with his wife and she is his primary caregiver  Food -denies Pharmacist, community -he uses a bus for [employer] -denies Warehouse manager -denies concerns  Sales promotion account executive -denies concerns  Employment -was previously a Network engineer -has 7 children and reports having good support.  Patient is trying to socially isolate given COVID-19.  Physical Activity -he uses a walker to ambulate and requires assistance from his wife with bathing and dressing   4. Co-morbidities Complicating Care: h/o CHF. Last ECHO was in 2016. Also has afib currently anticoagulated with Eliquis.  Is unclear if he is actively followed by cardiology.  He is followed closely by his PCP and patient says he has a follow-up visit scheduled in a few weeks.  He denies any concerns with his medications at home and reports taking them as directed.  Patient is being followed by our dietitian.  We discussed various resources available in the cancer center including the symptom management clinic.  Patient expressed understanding and was in agreement with this plan. He also understands that He can call clinic at any time with any questions, concerns, or complaints.   A total of (15) minutes of face-to-face time was spent with this patient with greater than 50% of that time in counseling and care-coordination.   Signed by: Altha Harm, PhD, DNP, NP-C, [facility] [phone] (Work Cell)

## 2019-03-11 ENCOUNTER — Encounter
Admission: RE | Disposition: A | Discharge status: Home / Self Care | Payer: Self-pay | Source: Home / Self Care | Attending: Vascular Surgery

## 2019-03-11 ENCOUNTER — Other Ambulatory Visit (INDEPENDENT_AMBULATORY_CARE_PROVIDER_SITE_OTHER): Payer: Self-pay | Admitting: Nurse Practitioner

## 2019-03-11 ENCOUNTER — Ambulatory Visit
Admission: RE | Admit: 2019-03-11 | Discharge: 2019-03-11 | Disposition: A | Discharge status: Home / Self Care | Payer: Medicare HMO | Attending: Vascular Surgery | Admitting: Vascular Surgery

## 2019-03-11 ENCOUNTER — Other Ambulatory Visit: Payer: Self-pay

## 2019-03-11 DIAGNOSIS — C3492 Malignant neoplasm of unspecified part of left bronchus or lung: Secondary | ICD-10-CM

## 2019-03-11 DIAGNOSIS — C3490 Malignant neoplasm of unspecified part of unspecified bronchus or lung: Secondary | ICD-10-CM

## 2019-03-11 HISTORY — PX: PORTA CATH INSERTION: CATH118285

## 2019-03-11 SURGERY — PORTA CATH INSERTION
Anesthesia: Moderate Sedation

## 2019-03-11 MED ORDER — METHYLPREDNISOLONE SODIUM SUCC 125 MG IJ SOLR: 125.0000 mg | Freq: Once | INTRAMUSCULAR | Status: DC | PRN

## 2019-03-11 MED ORDER — CLINDAMYCIN PHOSPHATE 300 MG/50ML IV SOLN: 300.0000 mg | Freq: Once | INTRAVENOUS | Status: DC

## 2019-03-11 MED ORDER — FENTANYL CITRATE (PF) 100 MCG/2ML IJ SOLN
INTRAMUSCULAR | Status: AC
  Filled 2019-03-11: qty 2

## 2019-03-11 MED ORDER — DIPHENHYDRAMINE HCL 50 MG/ML IJ SOLN: 50.0000 mg | Freq: Once | INTRAMUSCULAR | Status: DC | PRN

## 2019-03-11 MED ORDER — MIDAZOLAM HCL 2 MG/ML PO SYRP: 8.0000 mg | ORAL_SOLUTION | Freq: Once | ORAL | Status: DC | PRN

## 2019-03-11 MED ORDER — FENTANYL CITRATE (PF) 100 MCG/2ML IJ SOLN
INTRAMUSCULAR | Status: DC | PRN
  Administered 2019-03-11: 50 ug via INTRAVENOUS
  Administered 2019-03-11: 25 ug via INTRAVENOUS

## 2019-03-11 MED ORDER — FAMOTIDINE 20 MG PO TABS: 40.0000 mg | ORAL_TABLET | Freq: Once | ORAL | Status: DC | PRN

## 2019-03-11 MED ORDER — CLINDAMYCIN PHOSPHATE 300 MG/50ML IV SOLN
300.0000 mg | Freq: Once | INTRAVENOUS | Status: AC
  Administered 2019-03-11: 15:00:00 300 mg via INTRAVENOUS

## 2019-03-11 MED ORDER — HEPARIN (PORCINE) IN NACL 1000-0.9 UT/500ML-% IV SOLN
INTRAVENOUS | Status: AC
  Filled 2019-03-11: qty 500

## 2019-03-11 MED ORDER — SODIUM CHLORIDE 0.9 % IV SOLN
INTRAVENOUS | Status: DC
  Administered 2019-03-11: 1000 mL via INTRAVENOUS

## 2019-03-11 MED ORDER — ONDANSETRON HCL 4 MG/2ML IJ SOLN: 4.0000 mg | Freq: Four times a day (QID) | INTRAMUSCULAR | Status: DC | PRN

## 2019-03-11 MED ORDER — MIDAZOLAM HCL 2 MG/2ML IJ SOLN
INTRAMUSCULAR | Status: DC | PRN
  Administered 2019-03-11: 2 mg via INTRAVENOUS
  Administered 2019-03-11: 0.5 mg via INTRAVENOUS

## 2019-03-11 MED ORDER — MIDAZOLAM HCL 5 MG/5ML IJ SOLN
INTRAMUSCULAR | Status: AC
  Filled 2019-03-11: qty 5

## 2019-03-11 MED ORDER — HYDROMORPHONE HCL 1 MG/ML IJ SOLN: 1.0000 mg | Freq: Once | INTRAMUSCULAR | Status: DC | PRN

## 2019-03-11 MED ORDER — CLINDAMYCIN PHOSPHATE 300 MG/50ML IV SOLN
INTRAVENOUS | Status: AC
  Filled 2019-03-11: qty 50

## 2019-03-11 MED ORDER — HEPARIN SODIUM (PORCINE) 1000 UNIT/ML IJ SOLN
INTRAMUSCULAR | Status: AC
  Filled 2019-03-11: qty 1

## 2019-03-11 MED ORDER — SODIUM CHLORIDE 0.9 % IV SOLN
Freq: Once | INTRAVENOUS | Status: DC
  Filled 2019-03-11: qty 2

## 2019-03-11 SURGICAL SUPPLY — 12 items
CANNULA 5F STIFF (CANNULA) ×3 IMPLANT
COVER PROBE U/S 5X48 (MISCELLANEOUS) ×3 IMPLANT
DERMABOND ADVANCED (GAUZE/BANDAGES/DRESSINGS) ×2
DERMABOND ADVANCED .7 DNX12 (GAUZE/BANDAGES/DRESSINGS) ×1 IMPLANT
DRAPE INCISE IOBAN 66X45 STRL (DRAPES) ×6 IMPLANT
KIT PORT POWER 8FR ISP CVUE (Port) ×3 IMPLANT
PACK ANGIOGRAPHY (CUSTOM PROCEDURE TRAY) ×3 IMPLANT
SET INTRO CAPELLA COAXIAL (SET/KITS/TRAYS/PACK) ×3 IMPLANT
SUT MNCRL AB 4-0 PS2 18 (SUTURE) ×6 IMPLANT
SUT PROLENE 0 CT 1 30 (SUTURE) ×3 IMPLANT
SUT VIC AB 3-0 SH 27 (SUTURE) ×4
SUT VIC AB 3-0 SH 27X BRD (SUTURE) ×2 IMPLANT

## 2019-03-11 NOTE — Op Note (Signed)
OPERATIVE NOTE   PROCEDURE: 1. Placement of a right IJ Infuse-a-Port  PRE-OPERATIVE DIAGNOSIS: Non-small cell lung carcinoma left lung  POST-OPERATIVE DIAGNOSIS: Same  SURGEON: Katha Cabal M.D.  ANESTHESIA: Conscious sedation was administered under my direct supervision by the interventional radiology RN. IV Versed plus fentanyl were utilized. Continuous ECG, pulse oximetry and blood pressure was monitored throughout the entire procedure. Conscious sedation was for a total of 35 minutes.  ESTIMATED BLOOD LOSS: Minimal   FINDING(S): 1.  Patent vein  SPECIMEN(S): None  INDICATIONS:   Patrick Harding is a 85 y.o. male who presents with non-small cell carcinoma of the left lung.  He will require chemotherapy and therefore appropriate parenteral access.  Risks and benefits for a Infuse-a-Port placement are reviewed all questions answered patient agrees to proceed.  DESCRIPTION: After obtaining full informed written consent, the patient was brought back to the special procedure suite and placed in the supine position. The patient's right neck and chest wall are prepped and draped in sterile fashion. Appropriate timeout was called.  Ultrasound is placed in a sterile sleeve, ultrasound is utilized to avoid vascular injury as well as secondary to lack of appropriate landmarks. The right internal jugular vein is identified. It is echolucent and homogeneous as well as easily compressible indicating patency. An image is recorded for the permanent record.  Access to the vein with a micropuncture needle is done under direct ultrasound visualization.  1% lidocaine is infiltrated into the soft tissue at the base of the neck as well as on the chest wall.  Under direct ultrasound visualization a micro-needle is inserted into the vein followed by the micro-wire. Micro-sheath was then advanced and a J wire is inserted without difficulty under fluoroscopic guidance. A small counterincision was created at the  wire insertion site. A transverse incision is created 2 fingerbreadths below the scapula and a pocket is fashioned using both blunt and sharp dissection. The pocket is tested for appropriate size with the hub of the Infuse-a-Port. The tunneling device is then used to pull the intravascular portion of the catheter from the pocket to the neck counterincision.  Dilator and peel-away sheath were then inserted over the wire and the wire is removed. Catheter is then advanced into the venous system without difficulty. Peel-away sheath was then removed.  Catheter is then positioned under fluoroscopic guidance at the atrial caval junction. It is then transected connected to the hub and the hope is slipped into the subcutaneous pocket on the chest wall. The hub was then accessed percutaneously and aspirates easily and flushes well and is flushed with 30 cc of heparinized saline. The pocket incision is then closed in layers using interrupted 3-0 Vicryl for the subcutaneous tissues and 4-0 Monocryl subcuticular for skin closure. Dermabond is applied. The neck counterincision was closed with 4-0 Monocryl subcuticular and Dermabond as well.  The patient tolerated the procedure well and there were no immediate complications.  COMPLICATIONS: None  CONDITION: Unchanged  Katha Cabal M.D. [facility] Office: [phone]   03/11/2019, 4:17 PM

## 2019-03-11 NOTE — H&P (Signed)
[facility] History & Physical Update  The patient was interviewed and re-examined.  The patient's previous History and Physical has been reviewed and is unchanged.   The patient has confirmed non-small cell carcinoma of the left lung.  He is undergoing chemotherapy and therefore needs appropriate intravenous access.  Infuse-a-Port has been discussed with the patient.  There is no change in the plan of care. We plan to proceed with the scheduled procedure of Infuse-a-Port placement for chemotherapy.  Hortencia Pilar, MD  03/11/2019, 2:22 PM

## 2019-03-12 ENCOUNTER — Encounter: Payer: Self-pay | Admitting: Vascular Surgery

## 2019-03-16 ENCOUNTER — Other Ambulatory Visit: Payer: Self-pay

## 2019-03-17 ENCOUNTER — Other Ambulatory Visit: Payer: Self-pay

## 2019-03-17 ENCOUNTER — Inpatient Hospital Stay (HOSPITAL_BASED_OUTPATIENT_CLINIC_OR_DEPARTMENT_OTHER): Payer: Medicare HMO | Admitting: Oncology

## 2019-03-17 ENCOUNTER — Ambulatory Visit: Payer: Medicare HMO | Admitting: Oncology

## 2019-03-17 ENCOUNTER — Inpatient Hospital Stay: Payer: Medicare HMO

## 2019-03-17 ENCOUNTER — Encounter: Payer: Self-pay | Admitting: Oncology

## 2019-03-17 ENCOUNTER — Other Ambulatory Visit: Payer: Medicare HMO

## 2019-03-17 ENCOUNTER — Inpatient Hospital Stay: Payer: Medicare HMO | Attending: Oncology | Admitting: *Deleted

## 2019-03-17 VITALS — BP 123/91 | HR 66 | Temp 96.8°F | Resp 18 | Wt 129.3 lb

## 2019-03-17 DIAGNOSIS — Z87891 Personal history of nicotine dependence: Secondary | ICD-10-CM | POA: Insufficient documentation

## 2019-03-17 DIAGNOSIS — C3412 Malignant neoplasm of upper lobe, left bronchus or lung: Secondary | ICD-10-CM | POA: Insufficient documentation

## 2019-03-17 DIAGNOSIS — R64 Cachexia: Secondary | ICD-10-CM | POA: Insufficient documentation

## 2019-03-17 DIAGNOSIS — R634 Abnormal weight loss: Secondary | ICD-10-CM

## 2019-03-17 DIAGNOSIS — R05 Cough: Secondary | ICD-10-CM | POA: Insufficient documentation

## 2019-03-17 DIAGNOSIS — Z7901 Long term (current) use of anticoagulants: Secondary | ICD-10-CM | POA: Insufficient documentation

## 2019-03-17 DIAGNOSIS — Z5112 Encounter for antineoplastic immunotherapy: Secondary | ICD-10-CM

## 2019-03-17 DIAGNOSIS — R530 Neoplastic (malignant) related fatigue: Secondary | ICD-10-CM

## 2019-03-17 DIAGNOSIS — C3492 Malignant neoplasm of unspecified part of left bronchus or lung: Secondary | ICD-10-CM

## 2019-03-17 DIAGNOSIS — Z95828 Presence of other vascular implants and grafts: Secondary | ICD-10-CM | POA: Diagnosis not present

## 2019-03-17 DIAGNOSIS — R509 Fever, unspecified: Secondary | ICD-10-CM | POA: Diagnosis not present

## 2019-03-17 DIAGNOSIS — Z79899 Other long term (current) drug therapy: Secondary | ICD-10-CM | POA: Insufficient documentation

## 2019-03-17 DIAGNOSIS — I110 Hypertensive heart disease with heart failure: Secondary | ICD-10-CM | POA: Diagnosis not present

## 2019-03-17 LAB — CBC WITH DIFFERENTIAL/PLATELET
Abs Immature Granulocytes: 0.05 10*3/uL (ref 0.00–0.07)
Basophils Absolute: 0 10*3/uL (ref 0.0–0.1)
Basophils Relative: 0 %
Eosinophils Absolute: 0.6 10*3/uL — ABNORMAL HIGH (ref 0.0–0.5)
Eosinophils Relative: 7 %
HCT: 30.6 % — ABNORMAL LOW (ref 39.0–52.0)
Hemoglobin: 10 g/dL — ABNORMAL LOW (ref 13.0–17.0)
Immature Granulocytes: 1 %
Lymphocytes Relative: 18 %
Lymphs Abs: 1.4 10*3/uL (ref 0.7–4.0)
MCH: 27.9 pg (ref 26.0–34.0)
MCHC: 32.7 g/dL (ref 30.0–36.0)
MCV: 85.5 fL (ref 80.0–100.0)
Monocytes Absolute: 1.1 10*3/uL — ABNORMAL HIGH (ref 0.1–1.0)
Monocytes Relative: 14 %
Neutro Abs: 4.7 10*3/uL (ref 1.7–7.7)
Neutrophils Relative %: 60 %
Platelets: 195 10*3/uL (ref 150–400)
RBC: 3.58 MIL/uL — ABNORMAL LOW (ref 4.22–5.81)
RDW: 15.9 % — ABNORMAL HIGH (ref 11.5–15.5)
WBC: 7.8 10*3/uL (ref 4.0–10.5)
nRBC: 0 % (ref 0.0–0.2)

## 2019-03-17 LAB — COMPREHENSIVE METABOLIC PANEL
ALT: 13 U/L (ref 0–44)
AST: 18 U/L (ref 15–41)
Albumin: 3.3 g/dL — ABNORMAL LOW (ref 3.5–5.0)
Alkaline Phosphatase: 78 U/L (ref 38–126)
Anion gap: 7 (ref 5–15)
BUN: 44 mg/dL — ABNORMAL HIGH (ref 8–23)
CO2: 20 mmol/L — ABNORMAL LOW (ref 22–32)
Calcium: 9.1 mg/dL (ref 8.9–10.3)
Chloride: 109 mmol/L (ref 98–111)
Creatinine, Ser: 1.59 mg/dL — ABNORMAL HIGH (ref 0.61–1.24)
GFR calc Af Amer: 46 mL/min — ABNORMAL LOW (ref >60)
GFR calc non Af Amer: 40 mL/min — ABNORMAL LOW (ref >60)
Glucose, Bld: 94 mg/dL (ref 70–99)
Potassium: 4.5 mmol/L (ref 3.5–5.1)
Sodium: 136 mmol/L (ref 135–145)
Total Bilirubin: 0.5 mg/dL (ref 0.3–1.2)
Total Protein: 8.1 g/dL (ref 6.5–8.1)

## 2019-03-17 MED ORDER — SODIUM CHLORIDE 0.9% FLUSH
10.0000 mL | Freq: Once | INTRAVENOUS | Status: AC
  Administered 2019-03-17: 08:00:00 10 mL via INTRAVENOUS
  Filled 2019-03-17: qty 10

## 2019-03-17 MED ORDER — HEPARIN SOD (PORK) LOCK FLUSH 100 UNIT/ML IV SOLN
500.0000 [IU] | Freq: Once | INTRAVENOUS | Status: AC | PRN
  Administered 2019-03-17: 500 [IU]
  Filled 2019-03-17: qty 5

## 2019-03-17 MED ORDER — SODIUM CHLORIDE 0.9 % IV SOLN
200.0000 mg | Freq: Once | INTRAVENOUS | Status: AC
  Administered 2019-03-17: 10:00:00 200 mg via INTRAVENOUS
  Filled 2019-03-17: qty 8

## 2019-03-17 MED ORDER — SODIUM CHLORIDE 0.9 % IV SOLN
Freq: Once | INTRAVENOUS | Status: AC
  Administered 2019-03-17: 10:00:00 via INTRAVENOUS
  Filled 2019-03-17: qty 250

## 2019-03-17 NOTE — Progress Notes (Signed)
Hematology/Oncology follow up note [facility] Telephone:(336) [phone] Fax:(336) [phone]   Patient Care Team: Casilda Carls, MD as PCP - General (Internal Medicine) Telford Nab, RN as Registered Nurse  REFERRING PROVIDER: Dr.Malinda  CHIEF COMPLAINTS/REASON FOR VISIT:  Evaluation of lung mass  HISTORY OF PRESENTING ILLNESS:  Patrick Harding is a  85 y.o.  male with PMH listed below who was referred to me for evaluation of lung mass.   Patient recently presented emergency room for evaluation of fever of 101, nonproductive cough and profound weakness. CT done in the emergency room 10/08/2018 showed central left lung mass in the suprahilar region extending into AP window measuring approximately 3 x 4 x 6 cm, larger compared to previous exams.   Patient has been seen by Dr. Felicie Morn previously for lung mass. Patient's previous image work-up includes PET scan done 06/18/2017 which showed hypermetabolic right hilar mass or indeterminate etiology.  Favor bronchogenic carcinoma or lymphoma.  Mild metabolic activity of mediastinal lymph nodes is indeterminate.  Resolution of right middle lobe nodularity suggest infectious or inflammatory process.  Hypermetabolic asymmetric endplate lytic lesion at S1 with metabolic activity appear.  Favor benign process but cannot exclude solitary metastasis. Patient was seen and evaluated by Dr. Felicie Morn and had 01/28/2018 EBUS guided needle biopsy taking and several lymph node stations.  Patient also had a left upper lobe transbronchial cytology brushing, washing/BAL, removal of mucous plug from both lungs. Biopsy pathology was negative.  Per Dr. Ashby Dawes, patient's case was discussed on tumor board. Consensus reached to repeat a PET scan in 3 to 4 months for reevaluation and possible rebiopsy.  Patient had called office consult his follow-up appointments for PET scan and had not seen Dr. Felicie Morn since then.  #Today patient was  accompanied by wife and daughter to clinic to discuss CT scan and management plan. Reports feeling weak and fatigued.  Appetite has decreased.  His weight appears stable since February this year. Lives at home with wife. Mild shortness of breath with exertion.  Per family pack, patient does not walk much.  History of stroke in 2017, residual right sided weakness.  History of chronic systolic CHF, Neihart class III, global LVEF 25% [12/08/2015 2D echo] On Eliquis 2.5 twice daily for anticoagulation for risk reduction of stroke with atrial fibrillation/flutter.  # S/p EBUS biopsy.  Left upper lobe biopsy showed respiratory mucosa with areas of squamous metaplasia and rare atypical cells.  Left hilar mass: EBUS positive for malignancy. 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  Paratracheal mass: suspicious for malignancy.  Lung left upper lobe, BAL: non diagnostic.  Left upper lobe brushing negative for malignancy.   # He finished definitive radiation on 01/22/2019.    # 02/27/2019 CT chest without contrast was independently reviewed by me and discussed with patient. Mixed response. The previously noted left upper lobe perihilar mass has significantly decreased in size, previously noted lymphadenopathy has regressed, there are multiple new and enlarging left side pulmonary nodule, most evident in the left upper lobe, concerning for metastatic lesion. Mild diffuse bronchial wall thickening may suggest chronic bronchitis. Aortic atherosclerosis.  Multiple aneurysm dilation of the thoracic abdominal aorta, most severe in the ascending aorta which measures up to 5.3 cm in diameter.   INTERVAL HISTORY Patrick Harding is a 85 y.o. male who has above history reviewed by me today presents for follow up visit for evaluation prior to starting maintenance immunotherapy. Patient is by himself today, due to the  policy of no visitors secondary to Covid virus outbreak. He  reports feeling well at baseline. Continues to have chronic cough, productive with whitish sputum.  Denies any hemoptysis, shortness of breath more than his baseline, Appetite is fair.  Weight states the same compared to 2 weeks ago. Takes Megace 40 mg twice daily. He drinks Ensure and asks for more samples  Chronic fatigue at baseline.   Review of Systems  Constitutional: Positive for malaise/fatigue. Negative for chills, fever and weight loss.  HENT: Negative for nosebleeds and sore throat.   Eyes: Negative for double vision and redness.  Respiratory: Positive for cough. Negative for shortness of breath and wheezing.   Cardiovascular: Negative for chest pain, palpitations, orthopnea and leg swelling.  Gastrointestinal: Negative for abdominal pain, blood in stool, nausea and vomiting.  Genitourinary: Negative for dysuria and frequency.  Musculoskeletal: Negative for myalgias.       Right shoulder pain.   Skin: Negative for itching and rash.  Neurological: Negative for dizziness, tingling, tremors and weakness.  Endo/Heme/Allergies: Negative for environmental allergies. Does not bruise/bleed easily.  Psychiatric/Behavioral: Negative for hallucinations.    MEDICAL HISTORY:  Past Medical History:  Diagnosis Date  . Anemia   . Aortic aneurysm without rupture ([place]) 12/26/2015  . Arrhythmia   . Atrial fibrillation ([place])   . Benign neoplasm of cecum   . Benign neoplasm of transverse colon   . Blood in stool   . Cachexia ([place]) 05/11/2017  . CAP (community acquired pneumonia) 03/20/2016  . Chronic systolic CHF (congestive heart failure), NYHA class 3 ([place]) 12/26/2015   Overview:  Global ef 25%  . COPD (chronic obstructive pulmonary disease) ([place])   . Dysarthria due to recent stroke 03/12/2016  . Dyspnea on exertion 01/03/2016  . Dysrhythmia    atrial fib.  . Erectile dysfunction   . Essential hypertension 04/14/2015  . Gastritis   . Generalized weakness 05/11/2017  . Hyperlipidemia   .  Hypertension   . Left elbow pain 05/11/2017  . Lower extremity weakness (Right) 01/24/2016  . 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 Lumbar facet syndrome 08/10/2015  . Lumbar foraminal stenosis (Left Moderate-severe L4-5) (Bilateral Moderate L5-S1) 03/12/2016  . Non-small cell cancer of left lung ([place]) 03/02/2019  . Nonrheumatic aortic valve insufficiency 01/03/2016  . Panlobular emphysema ([place]) 01/03/2016  . Persistent atrial fibrillation 04/14/2015   Overview:  Last Assessment & Plan:  His ventricular rate continues to be elevated. Thus, I elected to increase the dose of metoprolol to 50 mg twice daily. Continue anticoagulation. He is complaining of increased dyspnea but his oxygen saturation is 95% on room air and his lungs are relatively clear but he does have diminished breath sounds. EKG does not show any new changes. Once his heart rate i  . Pressure ulcer 03/21/2016  . Rectal polyp   . Reflux esophagitis   . Renal insufficiency   . Sacroiliac joint dysfunction 08/10/2015  . Stroke determined by clinical assessment ([place]) 03/12/2016  . Thoracic ascending aortic aneurysm ([place]) 01/11/2016   Overview:  4.8cm 12/2015  . Vitamin D deficiency   . Weakness of both legs 12/26/2015    SURGICAL HISTORY: Past Surgical History:  Procedure Laterality Date  . BACK SURGERY     lumbar  . COLONOSCOPY WITH PROPOFOL N/A 07/17/2016   Procedure: COLONOSCOPY WITH PROPOFOL;  Surgeon: [name], MD;  Location: [facility] ENDOSCOPY;  Service: Endoscopy;  Laterality: N/A;  . ENDOBRONCHIAL ULTRASOUND N/A 01/27/2018   Procedure: ENDOBRONCHIAL ULTRASOUND;  Surgeon: [name],  MD;  Location: [facility] ORS;  Service: Pulmonary;  Laterality: N/A;  . ENDOBRONCHIAL ULTRASOUND Left 11/04/2018   Procedure: ENDOBRONCHIAL ULTRASOUND;  Surgeon: [name], MD;  Location: [facility] ORS;  Service: Pulmonary;  Laterality: Left;  . ESOPHAGOGASTRODUODENOSCOPY (EGD) WITH PROPOFOL N/A 07/17/2016   Procedure: ESOPHAGOGASTRODUODENOSCOPY  (EGD) WITH PROPOFOL;  Surgeon: [name], MD;  Location: [facility] ENDOSCOPY;  Service: Endoscopy;  Laterality: N/A;  . EYE SURGERY     cataract right  . FACIAL FRACTURE SURGERY    . Head surgery Left   . PORTA CATH INSERTION N/A 03/11/2019   Procedure: PORTA CATH INSERTION;  Surgeon: Katha Cabal, MD;  Location: [place] [facility];  Service: Cardiovascular;  Laterality: N/A;  . TONSILLECTOMY      SOCIAL HISTORY: Social History   Socioeconomic History  . Marital status: Married    Spouse name: Not on file  . Number of children: Not on file  . Years of education: Not on file  . Highest education level: Not on file  Occupational History  . Not on file  Social Needs  . Financial resource strain: Not on file  . Food insecurity:    Worry: Not on file    Inability: Not on file  . Transportation needs:    Medical: Not on file    Non-medical: Not on file  Tobacco Use  . Smoking status: Former Smoker    Packs/day: 0.50    Years: 20.00    Pack years: 10.00    Types: Cigarettes    Last attempt to quit: 10/24/2015    Years since quitting: 3.3  . Smokeless tobacco: Never Used  Substance and Sexual Activity  . Alcohol use: No    Alcohol/week: 0.0 standard drinks    Comment: quit smoking about 4 years ago  . Drug use: No  . Sexual activity: Not on file  Lifestyle  . Physical activity:    Days per week: Not on file    Minutes per session: Not on file  . Stress: Not on file  Relationships  . Social connections:    Talks on phone: Not on file    Gets together: Not on file    Attends religious service: Not on file    Active member of club or organization: Not on file    Attends meetings of clubs or organizations: Not on file    Relationship status: Not on file  . Intimate partner violence:    Fear of current or ex partner: Not on file    Emotionally abused: Not on file    Physically abused: Not on file    Forced sexual activity: Not on file  Other Topics Concern  . Not  on file  Social History Narrative  . Not on file    FAMILY HISTORY: Family History  Problem Relation Age of Onset  . Heart failure Mother   . Arthritis Father   . Bladder Cancer Neg Hx   . Kidney cancer Neg Hx   . Prolactinoma Neg Hx   . Prostate cancer Neg Hx     ALLERGIES:  is allergic to aspirin and penicillins.  MEDICATIONS:  Current Outpatient Medications  Medication Sig Dispense Refill  . acetaminophen (TYLENOL) 500 MG tablet Take 1,000 mg by mouth every 6 (six) hours as needed for mild pain or headache.    . AMBULATORY NON FORMULARY MEDICATION Medication Name: nebulizer and supplies DX:J43.1 1 each 0  . apixaban (ELIQUIS) 2.5 MG TABS tablet Take 1.25 mg by  mouth daily.     Marland Kitchen diltiazem (CARDIZEM CD) 180 MG 24 hr capsule Take 180 mg by mouth daily.    Marland Kitchen ipratropium-albuterol (DUONEB) 0.5-2.5 (3) MG/3ML SOLN Take 3 mLs by nebulization every 8 (eight) hours. And prn (Patient taking differently: Take 3 mLs by nebulization every 8 (eight) hours as needed (for shortness of breath or wheezing). ) 360 mL 11  . megestrol (MEGACE) 40 MG tablet Take 1 tablet (40 mg total) by mouth 2 (two) times daily. 60 tablet 3  . metoprolol (LOPRESSOR) 50 MG tablet Take 1 tablet (50 mg total) by mouth 2 (two) times daily. (Patient taking differently: Take 50 mg by mouth daily. ) 60 tablet 2  . Multiple Vitamins-Minerals (ADULT ONE DAILY GUMMIES PO) Take 2 tablets by mouth daily.    Marland Kitchen dextromethorphan-guaiFENesin (MUCINEX DM) 30-600 MG 12hr tablet Take 1 tablet by mouth 2 (two) times daily. (Patient not taking: Reported on 03/10/2019) 30 tablet 0  . guaifenesin (ROBITUSSIN) 100 MG/5ML syrup Take 10 mLs (200 mg total) by mouth 3 (three) times daily as needed for cough. (Patient not taking: Reported on 03/10/2019) 236 mL 0  . oxyCODONE (OXY IR/ROXICODONE) 5 MG immediate release tablet Take 1 tablet (5 mg total) by mouth every 6 (six) hours as needed for severe pain. (Patient not taking: Reported on 03/17/2019)  30 tablet 0   No current facility-administered medications for this visit.    Facility-Administered Medications Ordered in Other Visits  Medication Dose Route Frequency Provider Last Rate Last Dose  . heparin lock flush 100 unit/mL  500 Units Intracatheter Once PRN Earlie Server, MD         PHYSICAL EXAMINATION: ECOG PERFORMANCE STATUS: 2 - Symptomatic, <50% confined to bed Vitals:   03/17/19 0859  BP: (!) 123/91  Pulse: 66  Resp: 18  Temp: (!) 96.8 F (36 C)  SpO2: 100%   Filed Weights   03/17/19 0859  Weight: 129 lb 4.8 oz (58.7 kg)    Physical Exam Constitutional:      General: He is not in acute distress.    Appearance: He is ill-appearing.     Comments: Thin elderly male, chronic ill appearance, sitting in wheelchair. Cachectic.   HENT:     Head: Normocephalic and atraumatic.  Eyes:     General: No scleral icterus.    Pupils: Pupils are equal, round, and reactive to light.  Neck:     Musculoskeletal: Normal range of motion and neck supple.  Cardiovascular:     Rate and Rhythm: Normal rate and regular rhythm.     Heart sounds: Normal heart sounds.  Pulmonary:     Effort: Pulmonary effort is normal. No respiratory distress.     Breath sounds: No wheezing or rales.     Comments: Severely decreased breath sound bilaterally. Abdominal:     General: Bowel sounds are normal. There is no distension.     Palpations: Abdomen is soft. There is no mass.     Tenderness: There is no abdominal tenderness.  Musculoskeletal: Normal range of motion.        General: No deformity.  Skin:    General: Skin is warm and dry.     Findings: No erythema or rash.  Neurological:     Mental Status: He is alert and oriented to person, place, and time.     Cranial Nerves: No cranial nerve deficit.     Coordination: Coordination normal.  Psychiatric:        Behavior: Behavior  normal.        Thought Content: Thought content normal.     Comments: Flat affect      LABORATORY DATA:  I  have reviewed the data as listed Lab Results  Component Value Date   WBC 7.8 03/17/2019   HGB 10.0 (L) 03/17/2019   HCT 30.6 (L) 03/17/2019   MCV 85.5 03/17/2019   PLT 195 03/17/2019   Recent Labs    10/08/18 1553  03/02/19 0946 03/10/19 0915 03/17/19 0813  NA 136   < > 136 137 136  K 4.6   < > 4.3 4.5 4.5  CL 103   < > 106 105 109  CO2 26   < > 22 23 20*  GLUCOSE 102*   < > 82 78 94  BUN 25*   < > 41* 49* 44*  CREATININE 1.74*   < > 1.55* 1.68* 1.59*  CALCIUM 8.5*   < > 9.0 9.3 9.1  GFRNONAA 35*   < > 41* 37* 40*  GFRAA 40*   < > 48* 43* 46*  PROT 7.6   < > 8.0 8.3* 8.1  ALBUMIN 3.1*   < > 3.3* 3.4* 3.3*  AST 23   < > _0 ALT 18   < > _1 ALKPHOS 96   < > 79 81 78  BILITOT 0.9   < > 0.4 0.3 0.5  BILIDIR 0.3*  --   --   --   --   IBILI 0.6  --   --   --   --    < > = values in this interval not displayed.   Iron/TIBC/Ferritin/ %Sat No results found for: IRON, TIBC, FERRITIN, IRONPCTSAT    RADIOGRAPHIC STUDIES: I have personally reviewed the radiological images as listed and agreed with the findings in the report. # 10/08/2018 CT chest w contrast Central left lung mass in the suprahilar region extending into the AP window measuring approximately 3.4 x 6 cm likely larger compared to the previous exams. Found to be hypermetabolic on previous PET-CT and likely represents bronchogenic carcinoma. Adjacent mediastinal adenopathy as described. Several small pulmonary nodules bilaterally which may be due to metastatic disease although could be seen with atypical infectious or inflammatory processes. Recommend attention on follow-up. Aneurysmal dilatation of the ascending thoracic aorta measuring 4.8 cm in AP diameter without significant change. Aneurysmal dilatation of the descending thoracic aorta measuring 4.8 cm in AP diameter. Ascending thoracic aortic aneurysm. Recommend semi-annual imaging followup by CTA or MRA and referral to cardiothoracic surgery if not  already obtained. This recommendation follows 2010 ACCF/AHA/AATS/ACR/ASA/SCA/SCAI/SIR/STS/SVM Guidelines for the Diagnosis and Management of Patients With Thoracic Aortic Disease. Circulation. 2010; 121: G644-I347.   ASSESSMENT & PLAN:  1. Non-small cell cancer of left lung ([employer])   2. Cachexia ([place])   3. Weight loss, abnormal   4. Port-A-Cath in place   5. Encounter for antineoplastic immunotherapy   6. Neoplastic malignant related fatigue    #Non-small cell lung cancer, T4N3, Stage IIIC,  possible M1 disease if bilateral axillary and porta cava node hypermetabolic activity are counted.At least stage IIIc, or stage IV disease Labs are reviewed and discussed with patient. We had a lengthy discussion with patient and wife at last visit.  Rationale and side effects of starting immunotherapy with Beryle Flock was discussed with patient in details. Patient and wife decides to proceed with immunotherapy treatment. Ok to proceed with cycle 1 Keytruda.   # Weight loss,  continue megace 27m BID. Continue Ensure supplement, follow up with Dietitian.  Patient will follow up with me on 04/03/2019 for lab and evaluation of next cycle of Keytruda treatment on 04/07/2019/  We spent sufficient time to discuss many aspect of care, questions were answered to patient's satisfaction. Total face to face encounter time for this patient visit was 25 min. >50% of the time was  spent in counseling and coordination of care.    ZEarlie Server MD, PhD Hematology Oncology CEdward W Sparrow Hospitalat AGundersen Tri County Mem HsptlPager- [phone]/06/2019

## 2019-03-25 ENCOUNTER — Telehealth: Payer: Self-pay | Admitting: *Deleted

## 2019-03-25 NOTE — Telephone Encounter (Signed)
Wife Bartolo Darter called reporting that patient "has not been himself since that last treatment". She reports that he won't eat, can't get up and is "off" She reports that he will get a hold of soemthing and won't let go, I asked if he is having seizures and she said no, but that he is just not right. He did not want to have EMS called. I asked if she would be able to get him to the office if he was given an appointment and she said no. I advised that she call EMS and have him taken to ER for evaluation. She agreed to this.

## 2019-03-25 NOTE — Telephone Encounter (Signed)
Agree with ER evaluation

## 2019-03-26 ENCOUNTER — Telehealth: Payer: Self-pay | Admitting: *Deleted

## 2019-03-26 NOTE — Telephone Encounter (Signed)
EMS was there yesterday and did not want to take him to ER stating his vitals were good and did not want to expose him to possible COVID 19. Not sure what to do as he cannot get out of bed and wife cannot handle his care alone. If you order home health, the nurse could draw labs on him and then maybe do a web ex visit? I cannot make the referral though.

## 2019-03-26 NOTE — Telephone Encounter (Signed)
Wife has called again today and reports EMS did not take patient to hospital yesterday as his vital signs were good.She states she cannot handle caring for him with bathing and dressing him and wants home health aide to coe out for him. She states he is not eating or able to get out of bed. Please advise and call patient wife [name] back

## 2019-03-26 NOTE — Telephone Encounter (Signed)
Referral for RN assess, vitals and labs (cbc,cmp,tsh) emailed to [facility] to [place].  Notified pt wife to be expecting their call.

## 2019-03-26 NOTE — Telephone Encounter (Signed)
Ok with home health We can do a Webex visit. But he needs labs. If he is too weak to get out of bed, he should go to ER or call EMS for evaluation.

## 2019-03-27 ENCOUNTER — Inpatient Hospital Stay (HOSPITAL_BASED_OUTPATIENT_CLINIC_OR_DEPARTMENT_OTHER): Payer: Medicare HMO | Admitting: Oncology

## 2019-03-27 ENCOUNTER — Other Ambulatory Visit: Payer: Self-pay

## 2019-03-27 ENCOUNTER — Encounter: Payer: Self-pay | Admitting: Oncology

## 2019-03-27 DIAGNOSIS — R5383 Other fatigue: Secondary | ICD-10-CM

## 2019-03-27 DIAGNOSIS — R64 Cachexia: Secondary | ICD-10-CM | POA: Diagnosis not present

## 2019-03-27 DIAGNOSIS — R634 Abnormal weight loss: Secondary | ICD-10-CM | POA: Diagnosis not present

## 2019-03-27 DIAGNOSIS — C3412 Malignant neoplasm of upper lobe, left bronchus or lung: Secondary | ICD-10-CM | POA: Diagnosis not present

## 2019-03-27 DIAGNOSIS — Z7189 Other specified counseling: Secondary | ICD-10-CM

## 2019-03-27 DIAGNOSIS — C3492 Malignant neoplasm of unspecified part of left bronchus or lung: Secondary | ICD-10-CM

## 2019-03-27 DIAGNOSIS — Z87891 Personal history of nicotine dependence: Secondary | ICD-10-CM

## 2019-03-27 DIAGNOSIS — R627 Adult failure to thrive: Secondary | ICD-10-CM

## 2019-03-27 NOTE — Progress Notes (Signed)
HEMATOLOGY-ONCOLOGY TeleHEALTH VISIT PROGRESS NOTE  I connected with Patrick Harding on 03/27/19 at  1:30 PM EDT by video enabled telemedicine visit and verified that I am speaking with the correct person using two identifiers. I discussed the limitations, risks, security and privacy concerns of performing an evaluation and management service by telemedicine and the availability of in-person appointments. I also discussed with the patient that there may be a patient responsible charge related to this service. The patient expressed understanding and agreed to proceed.   Other persons participating in the visit and their role in the encounter:  Geraldine Solar, CMA, check in patient     Patient's location: Home  Provider's location: Home  Chief Complaint: Management of lung cancer, assessment of immunotherapy side effects, fatigue, poor oral intake   INTERVAL HISTORY Patrick Harding is a 85 y.o. male who has above history reviewed by me today presents for follow up visit for management of fatigue, poor oral intake, history of lung cancer currently on immunotherapy. Problems and complaints are listed below:  Patient was seen by me on 03/17/2019 and the patient was started on immunotherapy.  With [place] Patient's wife called and reports that patient has been feeling weak, not eating recently, weight loss.  Wife reports that she cannot care for him with bath in the dressing and wants home aide to come out and help him.  I have advised patient to go to emergency room for evaluation of worsening of patient's condition, week and a poor oral intake EMS came and evaluated patient and did not take patient to ER. We set up home health nurse to visit patient today and RN helped patient to set up telehealth video enabled visit for evaluation. Patient reports decreased appetite recently and not eating much.  Denies any swallowing difficulty or pain He is a poor historian.  Wife reports patient has some chronic back pain,  which is worse lately.  Intermittent.  Patient has not taken any Tylenol or pain medication yet.   Review of Systems  Constitutional: Positive for appetite change, fatigue and unexpected weight change.  Respiratory: Negative for chest tightness, cough and shortness of breath.   Cardiovascular: Negative for chest pain.  Gastrointestinal: Negative for abdominal pain.  Musculoskeletal: Positive for back pain.  Psychiatric/Behavioral: The patient is not nervous/anxious.     Past Medical History:  Diagnosis Date  . Anemia   . Aortic aneurysm without rupture ([place]) 12/26/2015  . Arrhythmia   . Atrial fibrillation ([place])   . Benign neoplasm of cecum   . Benign neoplasm of transverse colon   . Blood in stool   . Cachexia ([place]) 05/11/2017  . CAP (community acquired pneumonia) 03/20/2016  . Chronic systolic CHF (congestive heart failure), NYHA class 3 ([place]) 12/26/2015   Overview:  Global ef 25%  . COPD (chronic obstructive pulmonary disease) ([place])   . Dysarthria due to recent stroke 03/12/2016  . Dyspnea on exertion 01/03/2016  . Dysrhythmia    atrial fib.  . Erectile dysfunction   . Essential hypertension 04/14/2015  . Gastritis   . Generalized weakness 05/11/2017  . Hyperlipidemia   . Hypertension   . Left elbow pain 05/11/2017  . Lower extremity weakness (Right) 01/24/2016  . 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 Lumbar facet syndrome 08/10/2015  . Lumbar foraminal stenosis (Left Moderate-severe L4-5) (Bilateral Moderate L5-S1) 03/12/2016  . Non-small cell cancer of left lung ([place]) 03/02/2019  . Nonrheumatic aortic valve insufficiency 01/03/2016  . Panlobular emphysema ([place]) 01/03/2016  . Persistent  atrial fibrillation 04/14/2015   Overview:  Last Assessment & Plan:  His ventricular rate continues to be elevated. Thus, I elected to increase the dose of metoprolol to 50 mg twice daily. Continue anticoagulation. He is complaining of increased dyspnea but his oxygen saturation is 95% on room air and his lungs  are relatively clear but he does have diminished breath sounds. EKG does not show any new changes. Once his heart rate i  . Pressure ulcer 03/21/2016  . Rectal polyp   . Reflux esophagitis   . Renal insufficiency   . Sacroiliac joint dysfunction 08/10/2015  . Stroke determined by clinical assessment ([place]) 03/12/2016  . Thoracic ascending aortic aneurysm ([place]) 01/11/2016   Overview:  4.8cm 12/2015  . Vitamin D deficiency   . Weakness of both legs 12/26/2015   Past Surgical History:  Procedure Laterality Date  . BACK SURGERY     lumbar  . COLONOSCOPY WITH PROPOFOL N/A 07/17/2016   Procedure: COLONOSCOPY WITH PROPOFOL;  Surgeon: [name], MD;  Location: [facility] ENDOSCOPY;  Service: Endoscopy;  Laterality: N/A;  . ENDOBRONCHIAL ULTRASOUND N/A 01/27/2018   Procedure: ENDOBRONCHIAL ULTRASOUND;  Surgeon: [name], MD;  Location: [facility] ORS;  Service: Pulmonary;  Laterality: N/A;  . ENDOBRONCHIAL ULTRASOUND Left 11/04/2018   Procedure: ENDOBRONCHIAL ULTRASOUND;  Surgeon: [name], MD;  Location: [facility] ORS;  Service: Pulmonary;  Laterality: Left;  . ESOPHAGOGASTRODUODENOSCOPY (EGD) WITH PROPOFOL N/A 07/17/2016   Procedure: ESOPHAGOGASTRODUODENOSCOPY (EGD) WITH PROPOFOL;  Surgeon: [name], MD;  Location: [facility] ENDOSCOPY;  Service: Endoscopy;  Laterality: N/A;  . EYE SURGERY     cataract right  . FACIAL FRACTURE SURGERY    . Head surgery Left   . PORTA CATH INSERTION N/A 03/11/2019   Procedure: PORTA CATH INSERTION;  Surgeon: Katha Cabal, MD;  Location: [facility];  Service: Cardiovascular;  Laterality: N/A;  . TONSILLECTOMY      Family History  Problem Relation Age of Onset  . Heart failure Mother   . Arthritis Father   . Bladder Cancer Neg Hx   . Kidney cancer Neg Hx   . Prolactinoma Neg Hx   . Prostate cancer Neg Hx     Social History   Socioeconomic History  . Marital status: Married    Spouse name: Not on file  . Number of children: Not on file  .  Years of education: Not on file  . Highest education level: Not on file  Occupational History  . Not on file  Social Needs  . Financial resource strain: Not on file  . Food insecurity:    Worry: Not on file    Inability: Not on file  . Transportation needs:    Medical: Not on file    Non-medical: Not on file  Tobacco Use  . Smoking status: Former Smoker    Packs/day: 0.50    Years: 20.00    Pack years: 10.00    Types: Cigarettes    Last attempt to quit: 10/24/2015    Years since quitting: 3.4  . Smokeless tobacco: Never Used  Substance and Sexual Activity  . Alcohol use: No    Alcohol/week: 0.0 standard drinks    Comment: quit smoking about 4 years ago  . Drug use: No  . Sexual activity: Not on file  Lifestyle  . Physical activity:    Days per week: Not on file    Minutes per session: Not on file  . Stress: Not on file  Relationships  .  Social connections:    Talks on phone: Not on file    Gets together: Not on file    Attends religious service: Not on file    Active member of club or organization: Not on file    Attends meetings of clubs or organizations: Not on file    Relationship status: Not on file  . Intimate partner violence:    Fear of current or ex partner: Not on file    Emotionally abused: Not on file    Physically abused: Not on file    Forced sexual activity: Not on file  Other Topics Concern  . Not on file  Social History Narrative  . Not on file    Current Outpatient Medications on File Prior to Visit  Medication Sig Dispense Refill  . acetaminophen (TYLENOL) 500 MG tablet Take 1,000 mg by mouth every 6 (six) hours as needed for mild pain or headache.    . AMBULATORY NON FORMULARY MEDICATION Medication Name: nebulizer and supplies DX:J43.1 1 each 0  . apixaban (ELIQUIS) 2.5 MG TABS tablet Take 1.25 mg by mouth daily.     Marland Kitchen dextromethorphan-guaiFENesin (MUCINEX DM) 30-600 MG 12hr tablet Take 1 tablet by mouth 2 (two) times daily. 30 tablet 0  .  diltiazem (CARDIZEM CD) 180 MG 24 hr capsule Take 180 mg by mouth daily.    Marland Kitchen ipratropium-albuterol (DUONEB) 0.5-2.5 (3) MG/3ML SOLN Take 3 mLs by nebulization every 8 (eight) hours. And prn (Patient taking differently: Take 3 mLs by nebulization every 8 (eight) hours as needed (for shortness of breath or wheezing). ) 360 mL 11  . megestrol (MEGACE) 40 MG tablet Take 1 tablet (40 mg total) by mouth 2 (two) times daily. 60 tablet 3  . metoprolol (LOPRESSOR) 50 MG tablet Take 1 tablet (50 mg total) by mouth 2 (two) times daily. (Patient taking differently: Take 50 mg by mouth daily. ) 60 tablet 2  . Multiple Vitamins-Minerals (ADULT ONE DAILY GUMMIES PO) Take 2 tablets by mouth daily.     No current facility-administered medications on file prior to visit.     Allergies  Allergen Reactions  . Aspirin Itching  . Penicillins Hives, Itching and Other (See Comments)    Has patient had a PCN reaction causing immediate rash, facial/tongue/throat swelling, SOB or lightheadedness with hypotension: no Has patient had a PCN reaction causing severe rash involving mucus membranes or skin necrosis: no Has patient had a PCN reaction that required hospitalization no Has patient had a PCN reaction occurring within the last 10 years: no If all of the above answers are "NO", then may proceed with Cephalosporin use.        Observations/Objective: Today's Vitals   03/27/19 1254  PainSc: 0-No pain   There is no height or weight on file to calculate BMI.  Physical Exam  Constitutional:  Chronic ill appearance, cachectic  HENT:  Head: Normocephalic and atraumatic.  No thrush  Pulmonary/Chest: Effort normal. No respiratory distress.  Neurological: He is alert.  Psychiatric: Affect normal.    CBC    Component Value Date/Time   WBC 7.8 03/17/2019 0813   RBC 3.58 (L) 03/17/2019 0813   HGB 10.0 (L) 03/17/2019 0813   HCT 30.6 (L) 03/17/2019 0813   PLT 195 03/17/2019 0813   MCV 85.5 03/17/2019 0813    MCH 27.9 03/17/2019 0813   MCHC 32.7 03/17/2019 0813   RDW 15.9 (H) 03/17/2019 0813   LYMPHSABS 1.4 03/17/2019 0813   MONOABS 1.1 (H) 03/17/2019  0813   EOSABS 0.6 (H) 03/17/2019 0813   BASOSABS 0.0 03/17/2019 0813    CMP     Component Value Date/Time   NA 136 03/17/2019 0813   K 4.5 03/17/2019 0813   CL 109 03/17/2019 0813   CO2 20 (L) 03/17/2019 0813   GLUCOSE 94 03/17/2019 0813   BUN 44 (H) 03/17/2019 0813   CREATININE 1.59 (H) 03/17/2019 0813   CALCIUM 9.1 03/17/2019 0813   PROT 8.1 03/17/2019 0813   ALBUMIN 3.3 (L) 03/17/2019 0813   AST 18 03/17/2019 0813   ALT 13 03/17/2019 0813   ALKPHOS 78 03/17/2019 0813   BILITOT 0.5 03/17/2019 0813   GFRNONAA 40 (L) 03/17/2019 0813   GFRAA 46 (L) 03/17/2019 0813     Assessment and Plan: 1. Non-small cell cancer of left lung ([place])   2. Cachexia ([place])   3. Weight loss, abnormal   4. Other fatigue   5. Failure to thrive in adult   6. Goals of care, counseling/discussion     Communicated with home health nurse about patient's vital sign which is stable. Chronic fatigue worsened recently.  Proceed with checking TSH. Weight loss/cachexia/poor oral intake check CMP recommend to increase Megace 40 mg to 4 times daily. Continue Ensure twice daily. Goal of care was discussed with patient.  Prognosis is poor given patient's poor performance status, multiple comorbidity/  discussed with patient and his wife that I will hold additional immunotherapy at this point due to patient's poor performance status.  He is not a candidate for any chemotherapy or immunotherapy at this point. Recommend patient and wife to consider transition to comfort care/hospice.  Wife will consider and update me.   Labs reviewed.  Stable hemoglobin, kidney function, liver function.  TSH normal.  Follow Up Instructions: 1 week  I discussed the assessment and treatment plan with the patient. The patient was provided an opportunity to ask questions and all were  answered. The patient agreed with the plan and demonstrated an understanding of the instructions.  The patient was advised to call back or seek an in-person evaluation if the symptoms worsen or if the condition fails to improve as anticipated.   Earlie Server, MD 03/27/2019 6:08 PM

## 2019-03-27 NOTE — Progress Notes (Signed)
Called patient for TeleHealth visit.  Patients wife assisted with assessment and chart review.  Patients wife states that he is not eating much, some nausea, not drink much, weak, some SOB but nebulizer treatment helped & trouble chewing food.

## 2019-03-30 ENCOUNTER — Other Ambulatory Visit: Payer: Self-pay

## 2019-03-30 ENCOUNTER — Telehealth: Payer: Self-pay | Admitting: *Deleted

## 2019-03-30 DIAGNOSIS — C3492 Malignant neoplasm of unspecified part of left bronchus or lung: Secondary | ICD-10-CM

## 2019-03-30 DIAGNOSIS — R5383 Other fatigue: Secondary | ICD-10-CM

## 2019-03-30 NOTE — Telephone Encounter (Signed)
Patients wife, Patrick Harding, left vm stating that he is still very weak, not eating and constipated.  Attempted to return call.  Unable to get through.    Per Dr Tasia Catchings, send referral for palliative care.

## 2019-03-30 NOTE — Telephone Encounter (Signed)
Called patient, spoke with wife, Patrick Harding. She states that pt is weak, not eating or drinking, and "mind isnt right" doing things that are not normal, like urinating on his dinner yesterday.  Some red color in urine, urinating on himself at times.  Patrick Harding is requesting a nursing aid to come in to help with ADL's.   We have not received lab results back from [place].  Followed up with her to send them.

## 2019-03-31 ENCOUNTER — Other Ambulatory Visit: Payer: Self-pay

## 2019-03-31 ENCOUNTER — Emergency Department
Admission: EM | Admit: 2019-03-31 | Discharge: 2019-03-31 | Disposition: A | Discharge status: Home / Self Care | Payer: Medicare HMO | Attending: Student in an Organized Health Care Education/Training Program | Admitting: Student in an Organized Health Care Education/Training Program

## 2019-03-31 ENCOUNTER — Other Ambulatory Visit: Payer: Self-pay | Admitting: Hospice and Palliative Medicine

## 2019-03-31 ENCOUNTER — Emergency Department: Payer: Medicare HMO

## 2019-03-31 ENCOUNTER — Encounter: Payer: Self-pay | Admitting: Physician Assistant

## 2019-03-31 DIAGNOSIS — I5022 Chronic systolic (congestive) heart failure: Secondary | ICD-10-CM | POA: Diagnosis not present

## 2019-03-31 DIAGNOSIS — C3492 Malignant neoplasm of unspecified part of left bronchus or lung: Secondary | ICD-10-CM

## 2019-03-31 DIAGNOSIS — I110 Hypertensive heart disease with heart failure: Secondary | ICD-10-CM | POA: Insufficient documentation

## 2019-03-31 DIAGNOSIS — E860 Dehydration: Secondary | ICD-10-CM

## 2019-03-31 DIAGNOSIS — R531 Weakness: Secondary | ICD-10-CM | POA: Diagnosis not present

## 2019-03-31 DIAGNOSIS — J449 Chronic obstructive pulmonary disease, unspecified: Secondary | ICD-10-CM | POA: Insufficient documentation

## 2019-03-31 DIAGNOSIS — Z85118 Personal history of other malignant neoplasm of bronchus and lung: Secondary | ICD-10-CM | POA: Insufficient documentation

## 2019-03-31 DIAGNOSIS — Z79899 Other long term (current) drug therapy: Secondary | ICD-10-CM | POA: Diagnosis not present

## 2019-03-31 DIAGNOSIS — F17210 Nicotine dependence, cigarettes, uncomplicated: Secondary | ICD-10-CM | POA: Diagnosis not present

## 2019-03-31 DIAGNOSIS — Z8673 Personal history of transient ischemic attack (TIA), and cerebral infarction without residual deficits: Secondary | ICD-10-CM | POA: Diagnosis not present

## 2019-03-31 DIAGNOSIS — Z7901 Long term (current) use of anticoagulants: Secondary | ICD-10-CM | POA: Diagnosis not present

## 2019-03-31 LAB — CBC WITH DIFFERENTIAL/PLATELET
Abs Immature Granulocytes: 0.06 10*3/uL (ref 0.00–0.07)
Basophils Absolute: 0 10*3/uL (ref 0.0–0.1)
Basophils Relative: 0 %
Eosinophils Absolute: 0.5 10*3/uL (ref 0.0–0.5)
Eosinophils Relative: 5 %
HCT: 32.2 % — ABNORMAL LOW (ref 39.0–52.0)
Hemoglobin: 10.6 g/dL — ABNORMAL LOW (ref 13.0–17.0)
Immature Granulocytes: 1 %
Lymphocytes Relative: 17 %
Lymphs Abs: 1.7 10*3/uL (ref 0.7–4.0)
MCH: 27.7 pg (ref 26.0–34.0)
MCHC: 32.9 g/dL (ref 30.0–36.0)
MCV: 84.1 fL (ref 80.0–100.0)
Monocytes Absolute: 1.2 10*3/uL — ABNORMAL HIGH (ref 0.1–1.0)
Monocytes Relative: 12 %
Neutro Abs: 6.6 10*3/uL (ref 1.7–7.7)
Neutrophils Relative %: 65 %
Platelets: 317 10*3/uL (ref 150–400)
RBC: 3.83 MIL/uL — ABNORMAL LOW (ref 4.22–5.81)
RDW: 15.3 % (ref 11.5–15.5)
WBC: 10 10*3/uL (ref 4.0–10.5)
nRBC: 0 % (ref 0.0–0.2)

## 2019-03-31 LAB — URINALYSIS, COMPLETE (UACMP) WITH MICROSCOPIC
Bacteria, UA: NONE SEEN
Bilirubin Urine: NEGATIVE
Glucose, UA: NEGATIVE mg/dL
Hgb urine dipstick: NEGATIVE
Ketones, ur: 20 mg/dL — AB
Leukocytes,Ua: NEGATIVE
Nitrite: NEGATIVE
Protein, ur: NEGATIVE mg/dL
Specific Gravity, Urine: 1.023 (ref 1.005–1.030)
pH: 5 (ref 5.0–8.0)

## 2019-03-31 LAB — COMPREHENSIVE METABOLIC PANEL
ALT: 14 U/L (ref 0–44)
AST: 20 U/L (ref 15–41)
Albumin: 3.1 g/dL — ABNORMAL LOW (ref 3.5–5.0)
Alkaline Phosphatase: 72 U/L (ref 38–126)
Anion gap: 12 (ref 5–15)
BUN: 33 mg/dL — ABNORMAL HIGH (ref 8–23)
CO2: 18 mmol/L — ABNORMAL LOW (ref 22–32)
Calcium: 8.8 mg/dL — ABNORMAL LOW (ref 8.9–10.3)
Chloride: 103 mmol/L (ref 98–111)
Creatinine, Ser: 1.33 mg/dL — ABNORMAL HIGH (ref 0.61–1.24)
GFR calc Af Amer: 57 mL/min — ABNORMAL LOW (ref >60)
GFR calc non Af Amer: 49 mL/min — ABNORMAL LOW (ref >60)
Glucose, Bld: 107 mg/dL — ABNORMAL HIGH (ref 70–99)
Potassium: 4.3 mmol/L (ref 3.5–5.1)
Sodium: 133 mmol/L — ABNORMAL LOW (ref 135–145)
Total Bilirubin: 0.6 mg/dL (ref 0.3–1.2)
Total Protein: 8 g/dL (ref 6.5–8.1)

## 2019-03-31 MED ORDER — SODIUM CHLORIDE 0.9 % IV SOLN
Freq: Once | INTRAVENOUS | Status: AC
  Administered 2019-03-31: 15:00:00 via INTRAVENOUS

## 2019-03-31 NOTE — ED Notes (Signed)
Port accessed by this RN with no issue.

## 2019-03-31 NOTE — ED Notes (Signed)
Pt ambulated from bed to door with rolling walker assistance. Pt right foot was dragging a bit but pt has hx of prior stroke. Otherwise, pt ambulated with no issue.

## 2019-03-31 NOTE — ED Provider Notes (Signed)
[facility] Emergency Department Provider Note ____________________________________________  Time seen: 1410  I have reviewed the triage vital signs and the nursing notes.  HISTORY  Chief Complaint  Weakness  HPI Patrick Harding is a 85 y.o. male with the below medical history, presents to the ED via EMS from home.  Patient with a past medical history consistent with a malignant neoplasm of the lungs, presents with complaints of weakness, poor oral intake, and weakness.  According to chart review, the patient has been with decreased appetite and oral intake for the last 3 to 4 days.  His wife has been in telephone contact with his oncologist related to these concerns.  A telemetry visit with the primary provider was completed on 417, with disposition that included transition to comfort care/hospice, and holding any additional chemotherapy at this time.  The patient himself, has no objective complaints at this time, and denies any pain.  Past Medical History:  Diagnosis Date  . Anemia   . Aortic aneurysm without rupture ([place]) 12/26/2015  . Arrhythmia   . Atrial fibrillation ([place])   . Benign neoplasm of cecum   . Benign neoplasm of transverse colon   . Blood in stool   . Cachexia ([place]) 05/11/2017  . CAP (community acquired pneumonia) 03/20/2016  . Chronic systolic CHF (congestive heart failure), NYHA class 3 ([place]) 12/26/2015   Overview:  Global ef 25%  . COPD (chronic obstructive pulmonary disease) ([place])   . Dysarthria due to recent stroke 03/12/2016  . Dyspnea on exertion 01/03/2016  . Dysrhythmia    atrial fib.  . Erectile dysfunction   . Essential hypertension 04/14/2015  . Gastritis   . Generalized weakness 05/11/2017  . Hyperlipidemia   . Hypertension   . Left elbow pain 05/11/2017  . Lower extremity weakness (Right) 01/24/2016  . 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 Lumbar facet syndrome 08/10/2015  . Lumbar foraminal stenosis (Left Moderate-severe L4-5) (Bilateral  Moderate L5-S1) 03/12/2016  . Non-small cell cancer of left lung ([place]) 03/02/2019  . Nonrheumatic aortic valve insufficiency 01/03/2016  . Panlobular emphysema ([place]) 01/03/2016  . Persistent atrial fibrillation 04/14/2015   Overview:  Last Assessment & Plan:  His ventricular rate continues to be elevated. Thus, I elected to increase the dose of metoprolol to 50 mg twice daily. Continue anticoagulation. He is complaining of increased dyspnea but his oxygen saturation is 95% on room air and his lungs are relatively clear but he does have diminished breath sounds. EKG does not show any new changes. Once his heart rate i  . Pressure ulcer 03/21/2016  . Rectal polyp   . Reflux esophagitis   . Renal insufficiency   . Sacroiliac joint dysfunction 08/10/2015  . Stroke determined by clinical assessment ([place]) 03/12/2016  . Thoracic ascending aortic aneurysm ([place]) 01/11/2016   Overview:  4.8cm 12/2015  . Vitamin D deficiency   . Weakness of both legs 12/26/2015    Patient Active Problem List   Diagnosis Date Noted  . Non-small cell cancer of left lung ([place]) 03/02/2019  . Goals of care, counseling/discussion 10/30/2018  . Acute pain of left shoulder 05/11/2017  . Loss of appetite 05/11/2017  . Cachexia ([place]) 05/11/2017  . Generalized weakness 05/11/2017  . Left elbow pain 05/11/2017  . Neutrophilia 05/11/2017  . Blood in stool   . Benign neoplasm of cecum   . Rectal polyp   . Benign neoplasm of transverse colon   . Reflux esophagitis   . Gastritis   . HLD (  hyperlipidemia) 03/28/2016  . AKI (acute kidney injury) ([place]) 03/28/2016  . 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 Pressure ulcer 03/21/2016  . CAP (community acquired pneumonia) 03/20/2016  . Protein-calorie malnutrition, severe 03/20/2016  . Speech and language deficits 03/12/2016  . Dysarthria due to recent stroke 03/12/2016  . Stroke determined by clinical assessment ([place]) 03/12/2016  . Lumbar foraminal stenosis (Left Moderate-severe L4-5)  (Bilateral Moderate L5-S1) 03/12/2016  . Abnormal MRI, lumbar spine (2012) 01/24/2016  . Chronic low back pain (Location of Secondary source of pain) (Bilateral) (R>L) 01/24/2016  . Chronic midline back pain 01/24/2016  . Chronic lumbar radicular pain (Location of Primary Source of Pain) (Right) (L3/L4 dermatome) 01/24/2016  . Chronic pain 01/24/2016  . Chronic anticoagulation 01/24/2016  . Long-term use of high-risk medication 01/24/2016  . 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 Lower extremity weakness (Right) 01/24/2016  . Thoracic ascending aortic aneurysm ([place]) 01/11/2016  . Dyspnea on exertion 01/03/2016  . Former cigarette smoker 01/03/2016  . Nonrheumatic aortic valve insufficiency 01/03/2016  . Panlobular emphysema ([place]) 01/03/2016  . Weight loss, abnormal 12/26/2015  . Weakness of both legs 12/26/2015  . Pulmonary nodule, right 12/26/2015  . Aortic aneurysm without rupture ([place]) 12/26/2015  . Chronic systolic CHF (congestive heart failure), NYHA class 3 ([place]) 12/26/2015  . Spinal stenosis, lumbar region, with neurogenic claudication 08/10/2015  . Lumbar facet syndrome 08/10/2015  . Sacroiliac joint dysfunction 08/10/2015  . Atrial fibrillation ([place]) 04/14/2015  . Essential hypertension 04/14/2015  . Persistent atrial fibrillation 04/14/2015    Past Surgical History:  Procedure Laterality Date  . BACK SURGERY     lumbar  . COLONOSCOPY WITH PROPOFOL N/A 07/17/2016   Procedure: COLONOSCOPY WITH PROPOFOL;  Surgeon: [name], MD;  Location: [facility] ENDOSCOPY;  Service: Endoscopy;  Laterality: N/A;  . ENDOBRONCHIAL ULTRASOUND N/A 01/27/2018   Procedure: ENDOBRONCHIAL ULTRASOUND;  Surgeon: [name], MD;  Location: [facility] ORS;  Service: Pulmonary;  Laterality: N/A;  . ENDOBRONCHIAL ULTRASOUND Left 11/04/2018   Procedure: ENDOBRONCHIAL ULTRASOUND;  Surgeon: [name], MD;  Location: [facility] ORS;  Service: Pulmonary;   Laterality: Left;  . ESOPHAGOGASTRODUODENOSCOPY (EGD) WITH PROPOFOL N/A 07/17/2016   Procedure: ESOPHAGOGASTRODUODENOSCOPY (EGD) WITH PROPOFOL;  Surgeon: [name], MD;  Location: [facility] ENDOSCOPY;  Service: Endoscopy;  Laterality: N/A;  . EYE SURGERY     cataract right  . FACIAL FRACTURE SURGERY    . Head surgery Left   . PORTA CATH INSERTION N/A 03/11/2019   Procedure: PORTA CATH INSERTION;  Surgeon: Katha Cabal, MD;  Location: [facility];  Service: Cardiovascular;  Laterality: N/A;  . TONSILLECTOMY      Prior to Admission medications   Medication Sig Start Date End Date Taking? Authorizing Provider  acetaminophen (TYLENOL) 500 MG tablet Take 1,000 mg by mouth every 6 (six) hours as needed for mild pain or headache.   Yes [provider]  apixaban (ELIQUIS) 2.5 MG TABS tablet Take 1.25 mg by mouth daily.  05/11/17  Yes [provider]  diltiazem (CARDIZEM CD) 180 MG 24 hr capsule Take 180 mg by mouth daily.   Yes [provider]  ipratropium-albuterol (DUONEB) 0.5-2.5 (3) MG/3ML SOLN Take 3 mLs by nebulization every 8 (eight) hours. And prn Patient taking differently: Take 3 mLs by nebulization every 8 (eight) hours as needed (for shortness of breath or wheezing).  02/05/18  Yes [name], MD  megestrol (MEGACE) 40 MG tablet Take 1 tablet (40 mg total) by mouth 2 (  two) times daily. 02/13/19  Yes Earlie Server, MD  metoprolol (LOPRESSOR) 50 MG tablet Take 1 tablet (50 mg total) by mouth 2 (two) times daily. Patient taking differently: Take 50 mg by mouth daily.  12/06/15  Yes Wellington Hampshire, MD  Multiple Vitamins-Minerals (ADULT ONE DAILY GUMMIES PO) Take 2 tablets by mouth daily.   Yes [provider]    Allergies Aspirin and Penicillins  Family History  Problem Relation Age of Onset  . Heart failure Mother   . Arthritis Father   . Bladder Cancer Neg Hx   . Kidney cancer Neg Hx   . Prolactinoma Neg Hx   . Prostate cancer Neg  Hx     Social History Social History   Tobacco Use  . Smoking status: Former Smoker    Packs/day: 0.50    Years: 20.00    Pack years: 10.00    Types: Cigarettes    Last attempt to quit: 10/24/2015    Years since quitting: 3.4  . Smokeless tobacco: Never Used  Substance Use Topics  . Alcohol use: No    Alcohol/week: 0.0 standard drinks    Comment: quit smoking about 4 years ago  . Drug use: No    Review of Systems  Constitutional: Negative for fever. Weight loss and poor oral intake.  Eyes: Negative for visual changes. ENT: Negative for sore throat. Cardiovascular: Negative for chest pain. Respiratory: Negative for shortness of breath. Gastrointestinal: Negative for abdominal pain, vomiting and diarrhea. Genitourinary: Negative for dysuria. Musculoskeletal: Negative for back pain. Skin: Negative for rash. Neurological: Negative for headaches, focal weakness or numbness. ____________________________________________  PHYSICAL EXAM:  VITAL SIGNS: ED Triage Vitals  Enc Vitals Group     BP --      Pulse --      Resp --      Temp 03/31/19 1402 (!) 97.3 F (36.3 C)     Temp Source 03/31/19 1402 Oral     SpO2 --      Weight 03/31/19 1353 129 lb 4.8 oz (58.7 kg)     Height 03/31/19 1353 6' (1.829 m)     Head Circumference --      Peak Flow --      Pain Score 03/31/19 1352 0     Pain Loc --      Pain Edu? --      Excl. in [place]? --     Constitutional: Alert and oriented. Cachetic-appearing and in no distress. Head: Normocephalic and atraumatic. Eyes: Conjunctivae are normal. Normal extraocular movements Neck: Supple. No thyromegaly. Hematological/Lymphatic/Immunological: No cervical lymphadenopathy. Cardiovascular: Normal rate, regular rhythm. Normal distal pulses. Respiratory: Normal respiratory effort. No wheezes/rales/rhonchi. Gastrointestinal: Soft, flat, and nontender. No distention. No organomegaly. Musculoskeletal: Nontender with normal range of motion in  all extremities.  Neurologic:  Normal speech and language. No gross focal neurologic deficits are appreciated. Skin:  Skin is warm, dry and intact. No rash noted. Psychiatric: Mood and affect are normal. Patient exhibits appropriate insight and judgment. ____________________________________________   LABS (pertinent positives/negatives) Labs Reviewed  CBC WITH DIFFERENTIAL/PLATELET - Abnormal; Notable for the following components:      Result Value   RBC 3.83 (*)    Hemoglobin 10.6 (*)    HCT 32.2 (*)    Monocytes Absolute 1.2 (*)    All other components within normal limits  COMPREHENSIVE METABOLIC PANEL  URINALYSIS, COMPLETE (UACMP) WITH MICROSCOPIC   ____________________________________________  EKG  See EKG report ____________________________________________   RADIOLOGY  CXR  IMPRESSION: 1. No acute cardiopulmonary disease. 2. Persistent left upper lobe pulmonary nodule consistent with known malignancy. ____________________________________________  PROCEDURES  Procedures NS 1000 bolus @ 117ml/hr ____________________________________________  INITIAL IMPRESSION / ASSESSMENT AND PLAN / ED COURSE  Differential diagnosis includes, but is not limited to, alcohol, illicit or prescription medications, or other toxic ingestion; intracranial pathology such as stroke or intracerebral hemorrhage; fever or infectious causes including sepsis; hypoxemia and/or hypercarbia; uremia; trauma; endocrine related disorders such as diabetes, hypoglycemia, and thyroid-related diseases; hypertensive encephalopathy; etc.  Patient with ED evaluation of weakness and reports of poor oral intake. His exam, labs, and CXR are reassuring at this time. He is alert, oriented, and appropriate. He has been requesting discharge to home since his arrival. Despite his initial refusal to allow blood draws, he has allowed [place] to draw blood and submit a urine sample. He has been pleasant during his stay. He  has been offer food and drink on every encounter by me, and has consistently refused. He appears to be a candidate for palliative care due to his underlying malignancy. I believe this is being ordered by his primary physician. I will phone the wife to discuss any other concerns at this time.  ----------------------------------------- 5:11 PM on 03/31/2019 ----------------------------------------- Spoke the Florence Odowd (spouse) she is reassured by his normal exam and labs. She confirms reports of poor oral intake as of late. She endorses that she is able to manage the patient at home, and has no other concerns.  ____________________________________________  FINAL CLINICAL IMPRESSION(S) / ED DIAGNOSES  Final diagnoses:  Weakness  Dehydration      Carmie End, Dannielle Karvonen, PA-C 03/31/19 1717    Merlyn Lot, MD 04/01/19 1640

## 2019-03-31 NOTE — Progress Notes (Addendum)
Amb referral for palliative care

## 2019-03-31 NOTE — Telephone Encounter (Signed)
Per Dr Tasia Catchings, advised wife, Patrick Harding to call EMS to take him to ED for evaluation for altered mental status. Advised wife to call me/dr yu if EMS refuses to take him.

## 2019-03-31 NOTE — Telephone Encounter (Signed)
Per Dr Tasia Catchings, gave verbal order for [facility] nurse to obtain urine sample for UA and culture. Nurse also asked for verbal for a social work, Dr Tasia Catchings agreed.  Mardene Celeste from [facility] left vm reporting that when she arrived to patients home for urine sample patient was unwilling to give and was combative, nurse left cup at the home with wife to obtain and call her once received.

## 2019-03-31 NOTE — Discharge Instructions (Signed)
Follow-up with the primary provider for ongoing symptoms. Offer water or Gatorade to prevent dehydration.

## 2019-03-31 NOTE — Telephone Encounter (Signed)
Wife, Bartolo Darter, called.  EMS picked patient up to transport to ED.  Bartolo Darter says she is concerned that maybe he has had another stroke, she states that he is having trouble functioning the right side of his body.  Called ED charge nurse, Marya Amsler, notified him of what wife has told me.

## 2019-03-31 NOTE — ED Triage Notes (Signed)
Pt arrived via [facility] from home with increased weakness over 3 days. Hx of COPD and lung CA.  Vitals WNL, a fib on monitor, pt unsure if that is old or new.

## 2019-04-02 ENCOUNTER — Other Ambulatory Visit: Payer: Self-pay

## 2019-04-03 ENCOUNTER — Inpatient Hospital Stay: Payer: Medicare HMO

## 2019-04-03 ENCOUNTER — Ambulatory Visit (HOSPITAL_BASED_OUTPATIENT_CLINIC_OR_DEPARTMENT_OTHER): Payer: Medicare HMO | Admitting: Hospice and Palliative Medicine

## 2019-04-03 ENCOUNTER — Inpatient Hospital Stay: Payer: Medicare HMO | Admitting: Oncology

## 2019-04-03 ENCOUNTER — Inpatient Hospital Stay: Payer: Medicare HMO | Admitting: Hospice and Palliative Medicine

## 2019-04-03 ENCOUNTER — Telehealth: Payer: Self-pay | Admitting: *Deleted

## 2019-04-03 ENCOUNTER — Other Ambulatory Visit: Payer: Self-pay | Admitting: Hospice and Palliative Medicine

## 2019-04-03 DIAGNOSIS — C3492 Malignant neoplasm of unspecified part of left bronchus or lung: Secondary | ICD-10-CM

## 2019-04-03 DIAGNOSIS — R531 Weakness: Secondary | ICD-10-CM | POA: Diagnosis not present

## 2019-04-03 DIAGNOSIS — Z710 Person encountering health services to consult on behalf of another person: Secondary | ICD-10-CM | POA: Diagnosis not present

## 2019-04-03 DIAGNOSIS — Z7189 Other specified counseling: Secondary | ICD-10-CM

## 2019-04-03 DIAGNOSIS — E46 Unspecified protein-calorie malnutrition: Secondary | ICD-10-CM

## 2019-04-03 DIAGNOSIS — Z515 Encounter for palliative care: Secondary | ICD-10-CM | POA: Diagnosis not present

## 2019-04-03 NOTE — Progress Notes (Signed)
Referral order for hospice at home

## 2019-04-03 NOTE — Progress Notes (Signed)
Virtual Visit via Telephone Note  I connected with wife of Patrick Harding on 04/03/19 at 10:30 AM EDT by telephone and verified that I am speaking with the correct person using two identifiers.   I discussed the limitations, risks, security and privacy concerns of performing an evaluation and management service by telephone and the availability of in person appointments. I also discussed with the patient that there may be a patient responsible charge related to this service. The patient expressed understanding and agreed to proceed.   History of Present Illness: Patrick Harding is an 85 year old man with stage III non-small cell lung cancer diagnosed in March 2020 patient was started on Keytruda.  PMH also notable for COPD, cardiomyopathy with EF of 25%, history of CVA, and atrial fibrillation anticoagulated on Eliquis.  Patient has had declining performance status, increasing weakness, and poor oral intake over the past couple of weeks.  He was last seen in consultation with oncology via telehealth visit on 4/17 with discussion about transition to comfort care or hospice.  Patient was seen in the ER on 4/21 with weakness and dehydration.  Patient was ultimately discharged home after receiving IV fluids.  He has been followed by home health and palliative care was recently ordered for in-home evaluation.   Observations/Objective: I spoke with wife today by phone.  Patient was scheduled to be seen in the clinic but could not physically leave the home due to weakness and decline.  Wife says that patient has become essentially chair bound and is no longer able to ambulate.  She is using the wheelchair to transfer him from the recliner to the bedside commode.  Patient is eating only sips and bites.  He also reportedly has had some confusion.  We discussed option of evaluation in the ER and possible hospitalization due to decline versus focusing on his comfort at home.  Wife verbalized an understanding that patient  might be nearing end-of-life, particularly in the absence of work-up or treatment.  She also recognizes that he appears too frail to receive further immunotherapy in the near future.   We discussed the option of hospice involvement at home and wife says that she would be interested in that with a focus on keeping him comfortable.  We also talked about a residential hospice facility if needed for end-of-life care.  We discussed CODE STATUS.  Wife says that patient would not want to be resuscitated nor have his life prolonged artificially on machines.  She told me as frail as patient has become, she would expect broken ribs with CPR and she does not want that.  She would want him to be a DO NOT RESUSCITATE.  Will ask that hospice help coordinate getting him a signed DNR order to have at home.  Assessment and Plan: -Best supportive care -Recommend hospice referral for in-home care. Will speak with Dr. Tasia Catchings about this -DNR   I discussed the assessment and treatment plan with the patient's wife. The family was provided an opportunity to ask questions and all were answered. The family agreed with the plan and demonstrated an understanding of the instructions.   The family was advised to call back or seek an in-person evaluation if the symptoms worsen or if the condition fails to improve as anticipated.  I provided 30 minutes of non-face-to-face time during this encounter.   Irean Hong, NP

## 2019-04-03 NOTE — Telephone Encounter (Signed)
[facility] with [facility] called reporting that they received a referral from [place] care for hospice services on this patient. The patient has signed consent for comfort services. She is asking for H&P, most recent office note and medicine list from [place] to be faxed to [phone]. She did not mention for doctor to sign orders or act as attending and did not leave her call back number either.

## 2019-04-03 NOTE — Telephone Encounter (Signed)
Josh spoke to wife. Working on sending referral to hospice.

## 2019-04-03 NOTE — Telephone Encounter (Signed)
ife called reporting that she was unable to get him up dressed and in the [place] this morning, She is asking for help with him. She states Palliative Care is supposed to come out after lunch today to see him. She said something about not able to get help while being treated. ? Hospice?

## 2019-04-06 ENCOUNTER — Telehealth: Payer: Self-pay | Admitting: *Deleted

## 2019-04-06 NOTE — Telephone Encounter (Signed)
Referral to [facility] care sent on friday. Per Blair Heys gave referral [facility] since they were already in the home. Dr. Grayland Ormond signed order and order was faxed to [facility] hospice.

## 2019-04-06 NOTE — Telephone Encounter (Signed)
Patrick Harding with [facility] called reporting that she faxed over orders to be signed on 04/03/19 if Dr Tasia Catchings will agree to serve as attending. Please advise if you will sign, or if they need to get PCP to sign orders

## 2019-04-07 ENCOUNTER — Inpatient Hospital Stay: Payer: Medicare HMO

## 2019-06-03 ENCOUNTER — Ambulatory Visit: Payer: Medicare HMO | Admitting: Radiation Oncology

## 2019-06-10 DEATH — deceased

## 2019-06-11 IMAGING — CT CT HEAD W/O CM
3 of 4 series · 15 of 47 positions shown, 18 images · non-contrast
Comparison: 10/08/2018

CLINICAL DATA: Slurred speech, weakness, altered mental status

EXAM:
CT HEAD WITHOUT CONTRAST
TECHNIQUE: Contiguous axial images were obtained from the base of the skull
through the vertex without intravenous contrast.

[Series 3: head wo · axial · 0.42mm/px · z∈[-124,+1]mm · 9 of 29 slices shown, 12 images]
[im 2/29  brain]
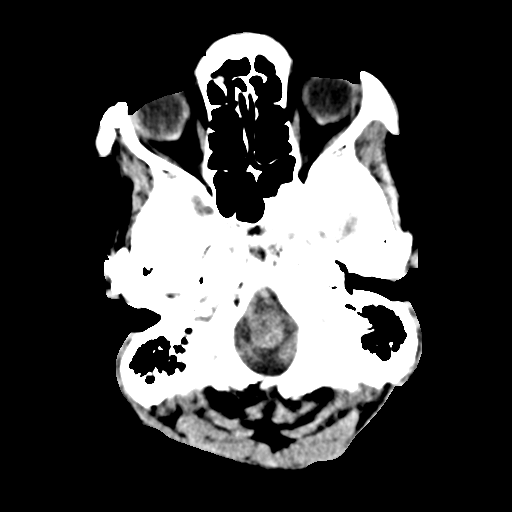
[im 2/29  bone]
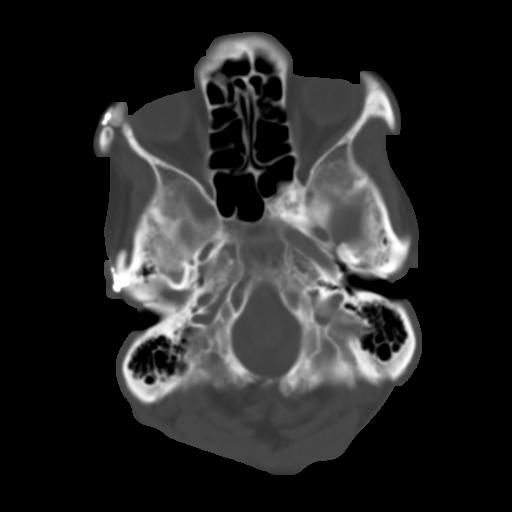
[im 6/29  brain]
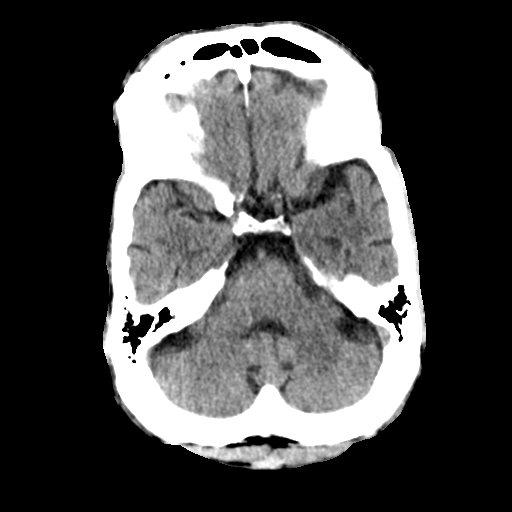
[im 8/29  brain]
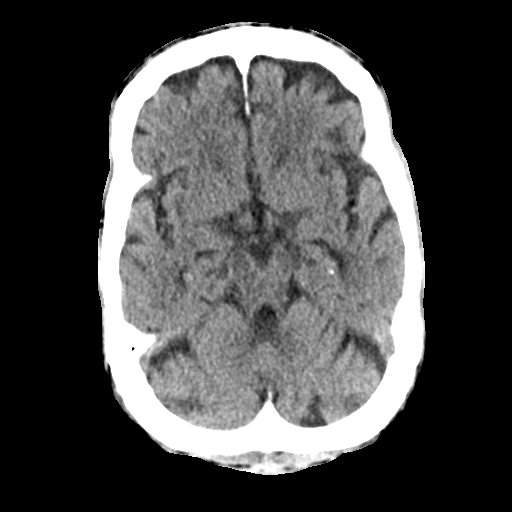
[im 12/29  brain]
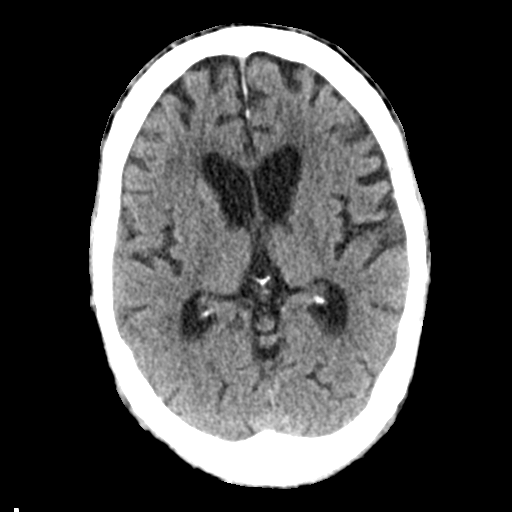
[im 15/29  brain]
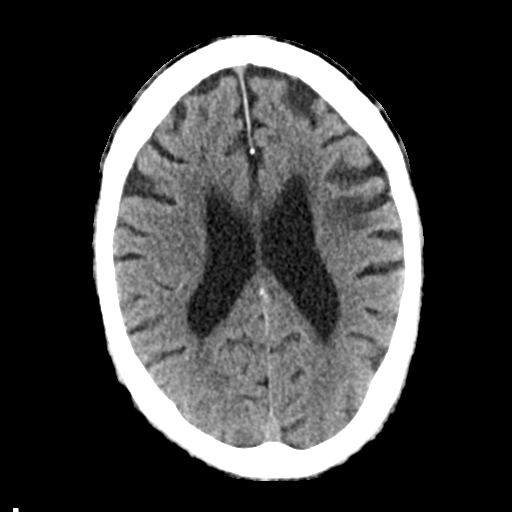
[im 15/29  bone]
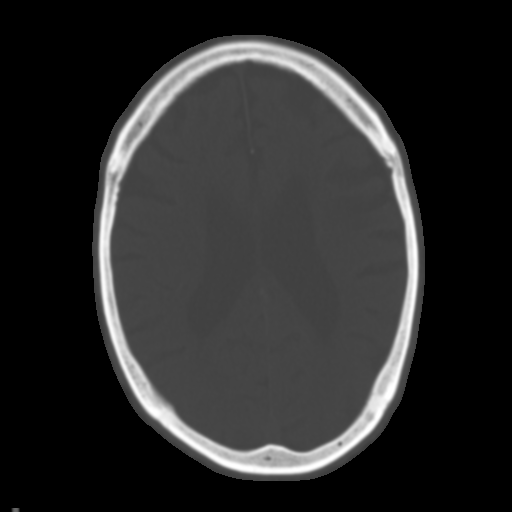
[im 17/29  brain]
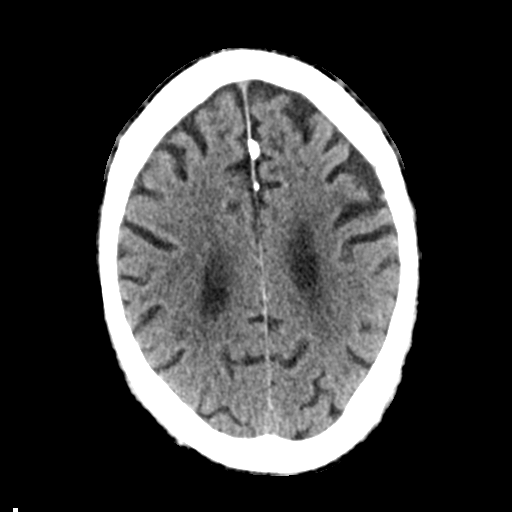
[im 21/29  brain]
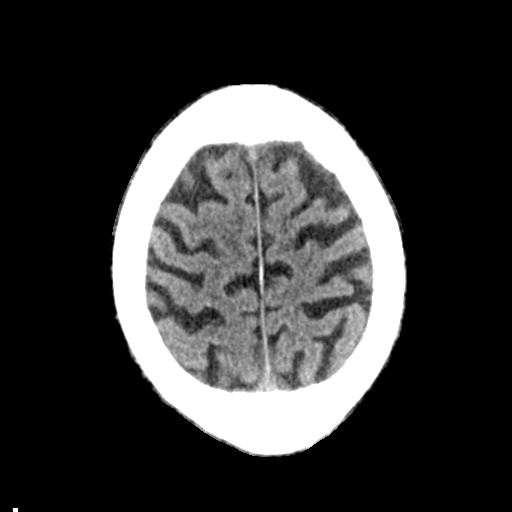
[im 23/29  brain]
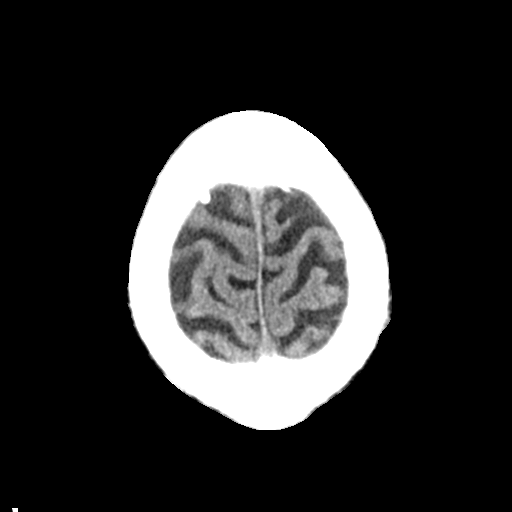
[im 27/29  brain]
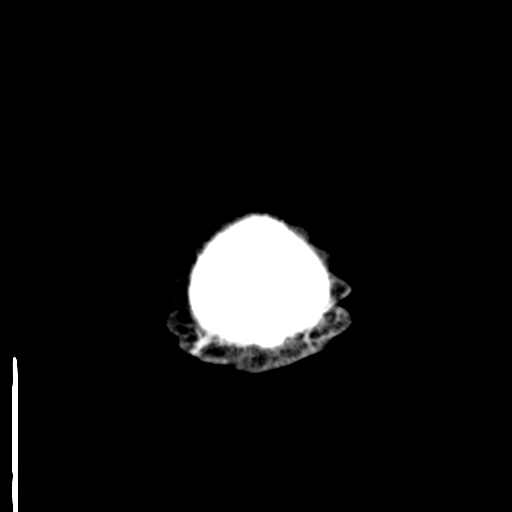
[im 27/29  bone]
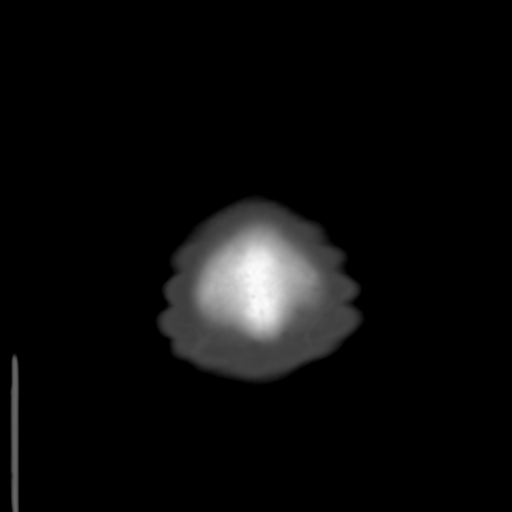

[Series 4: coronal soft tissue · coronal · 0.29mm/px · 3 of 65 slices shown]
[im 22/65  brain]
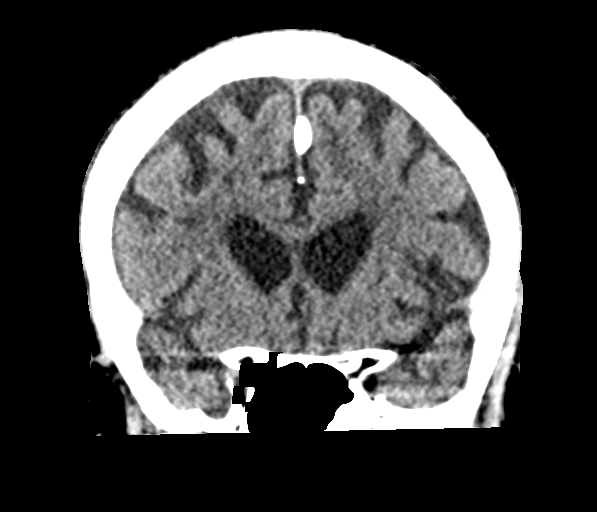
[im 29/65  brain]
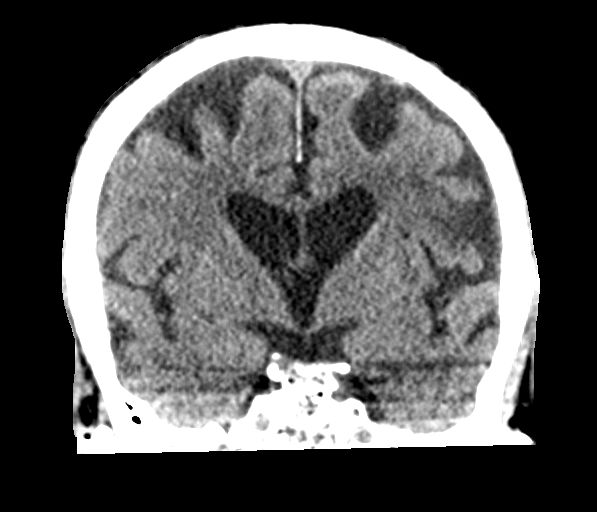
[im 36/65  brain]
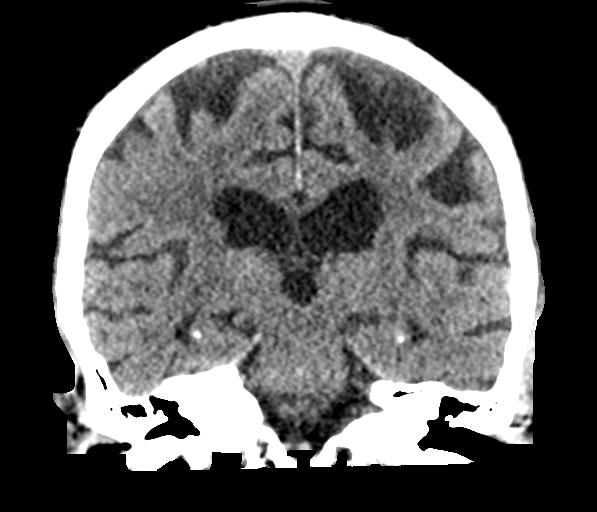

[Series 5: sagittal soft tissue · sagittal · 0.31mm/px · 3 of 50 slices shown]
[im 17/50  brain]
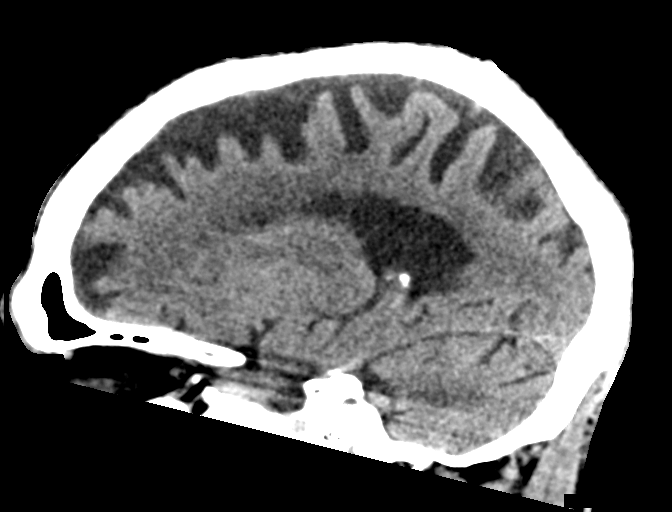
[im 25/50  brain]
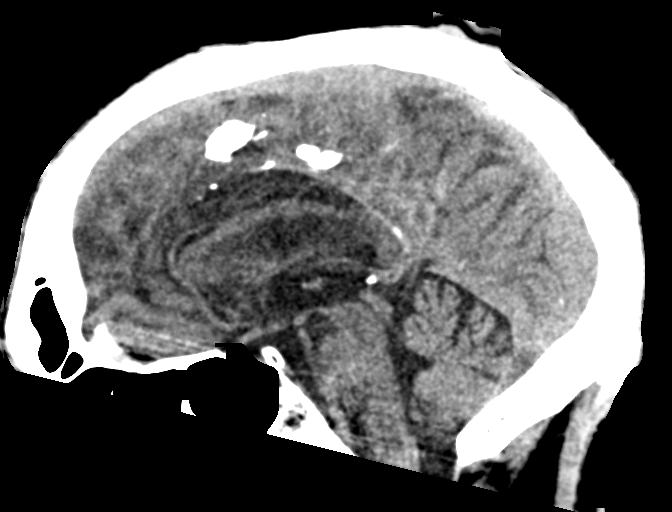
[im 33/50  brain]
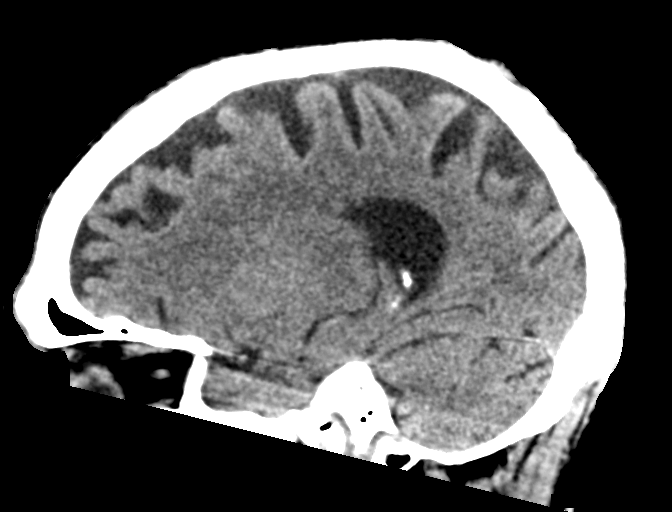

[15 of 47 positions shown; findings below may reference images not displayed]

FINDINGS: Brain: There is atrophy and chronic small vessel disease changes.
Old posterior left frontal infarct, stable. No acute intracranial
abnormality. Specifically, no hemorrhage, hydrocephalus, mass
lesion, acute infarction, or significant intracranial injury.

Vascular: No hyperdense vessel or unexpected calcification.

Skull: No acute calvarial abnormality.

Sinuses/Orbits: Visualized paranasal sinuses and mastoids clear.
Orbital soft tissues unremarkable.

Other: None
IMPRESSION: Small old left frontal infarct, stable.

No acute intracranial abnormality.

Atrophy, chronic microvascular disease.

## 2024-08-28 NOTE — Telephone Encounter (Signed)
 errpr
